# Patient Record
Sex: Female | Born: 1945
Health system: Southern US, Community
[De-identification: ages and names within clinical notes are randomized; demographics above are authoritative.]

## PROBLEM LIST (undated history)

## (undated) DIAGNOSIS — N904 Leukoplakia of vulva: Secondary | ICD-10-CM

## (undated) DIAGNOSIS — H269 Unspecified cataract: Secondary | ICD-10-CM

## (undated) DIAGNOSIS — F329 Major depressive disorder, single episode, unspecified: Secondary | ICD-10-CM

## (undated) DIAGNOSIS — D126 Benign neoplasm of colon, unspecified: Secondary | ICD-10-CM

## (undated) DIAGNOSIS — E119 Type 2 diabetes mellitus without complications: Secondary | ICD-10-CM

## (undated) DIAGNOSIS — I10 Essential (primary) hypertension: Secondary | ICD-10-CM

## (undated) DIAGNOSIS — F419 Anxiety disorder, unspecified: Secondary | ICD-10-CM

## (undated) DIAGNOSIS — N950 Postmenopausal bleeding: Secondary | ICD-10-CM

## (undated) DIAGNOSIS — E785 Hyperlipidemia, unspecified: Secondary | ICD-10-CM

## (undated) DIAGNOSIS — H409 Unspecified glaucoma: Secondary | ICD-10-CM

## (undated) DIAGNOSIS — G629 Polyneuropathy, unspecified: Secondary | ICD-10-CM

## (undated) DIAGNOSIS — S92909A Unspecified fracture of unspecified foot, initial encounter for closed fracture: Secondary | ICD-10-CM

## (undated) DIAGNOSIS — R079 Chest pain, unspecified: Secondary | ICD-10-CM

## (undated) DIAGNOSIS — F32A Depression, unspecified: Secondary | ICD-10-CM

## (undated) DIAGNOSIS — R011 Cardiac murmur, unspecified: Secondary | ICD-10-CM

## (undated) HISTORY — DX: Type 2 diabetes mellitus without complications: E11.9

## (undated) HISTORY — PX: CARPAL TUNNEL RELEASE: SHX101

## (undated) HISTORY — DX: Benign neoplasm of colon, unspecified: D12.6

## (undated) HISTORY — DX: Anxiety disorder, unspecified: F41.9

## (undated) HISTORY — DX: Unspecified cataract: H26.9

## (undated) HISTORY — DX: Cardiac murmur, unspecified: R01.1

## (undated) HISTORY — PX: TUBAL LIGATION: SHX77

## (undated) HISTORY — DX: Chest pain, unspecified: R07.9

## (undated) HISTORY — DX: Depression, unspecified: F32.A

## (undated) HISTORY — DX: Unspecified glaucoma: H40.9

## (undated) HISTORY — DX: Postmenopausal bleeding: N95.0

## (undated) HISTORY — DX: Essential (primary) hypertension: I10

## (undated) HISTORY — DX: Unspecified fracture of unspecified foot, initial encounter for closed fracture: S92.909A

## (undated) HISTORY — DX: Polyneuropathy, unspecified: G62.9

## (undated) HISTORY — DX: Leukoplakia of vulva: N90.4

## (undated) HISTORY — DX: Hyperlipidemia, unspecified: E78.5

## (undated) HISTORY — DX: Major depressive disorder, single episode, unspecified: F32.9

---

## 1967-08-06 HISTORY — PX: CHOLECYSTECTOMY: SHX55

## 1997-02-10 HISTORY — PX: HYSTEROSCOPY: SHX211

## 1997-09-30 ENCOUNTER — Other Ambulatory Visit: Admission: RE | Admit: 1997-09-30 | Discharge: 1997-09-30 | Payer: Self-pay | Admitting: Obstetrics and Gynecology

## 1997-09-30 HISTORY — PX: ENDOMETRIAL BIOPSY: SHX622

## 1998-07-24 ENCOUNTER — Other Ambulatory Visit: Admission: RE | Admit: 1998-07-24 | Discharge: 1998-07-24 | Payer: Self-pay | Admitting: Obstetrics and Gynecology

## 1998-12-27 ENCOUNTER — Emergency Department (HOSPITAL_COMMUNITY): Admission: EM | Admit: 1998-12-27 | Discharge: 1998-12-27 | Payer: Self-pay | Admitting: Emergency Medicine

## 1999-11-19 ENCOUNTER — Encounter: Admission: RE | Admit: 1999-11-19 | Discharge: 1999-12-25 | Payer: Self-pay | Admitting: Orthopedic Surgery

## 1999-12-13 ENCOUNTER — Other Ambulatory Visit: Admission: RE | Admit: 1999-12-13 | Discharge: 1999-12-13 | Payer: Self-pay | Admitting: Obstetrics and Gynecology

## 2000-12-19 ENCOUNTER — Ambulatory Visit (HOSPITAL_COMMUNITY): Admission: RE | Admit: 2000-12-19 | Discharge: 2000-12-19 | Payer: Self-pay | Admitting: Obstetrics and Gynecology

## 2000-12-19 ENCOUNTER — Encounter (INDEPENDENT_AMBULATORY_CARE_PROVIDER_SITE_OTHER): Payer: Self-pay | Admitting: *Deleted

## 2001-04-20 ENCOUNTER — Other Ambulatory Visit: Admission: RE | Admit: 2001-04-20 | Discharge: 2001-04-20 | Payer: Self-pay | Admitting: Obstetrics and Gynecology

## 2002-09-09 ENCOUNTER — Other Ambulatory Visit: Admission: RE | Admit: 2002-09-09 | Discharge: 2002-09-09 | Payer: Self-pay | Admitting: Obstetrics and Gynecology

## 2002-09-09 ENCOUNTER — Emergency Department (HOSPITAL_COMMUNITY): Admission: EM | Admit: 2002-09-09 | Discharge: 2002-09-09 | Payer: Self-pay | Admitting: Emergency Medicine

## 2003-10-13 ENCOUNTER — Other Ambulatory Visit: Admission: RE | Admit: 2003-10-13 | Discharge: 2003-10-13 | Payer: Self-pay | Admitting: Obstetrics and Gynecology

## 2004-08-07 ENCOUNTER — Ambulatory Visit: Payer: Self-pay | Admitting: Internal Medicine

## 2004-08-16 ENCOUNTER — Ambulatory Visit: Payer: Self-pay | Admitting: Internal Medicine

## 2005-08-08 ENCOUNTER — Other Ambulatory Visit: Admission: RE | Admit: 2005-08-08 | Discharge: 2005-08-08 | Payer: Self-pay | Admitting: Obstetrics and Gynecology

## 2006-02-07 ENCOUNTER — Encounter (INDEPENDENT_AMBULATORY_CARE_PROVIDER_SITE_OTHER): Payer: Self-pay | Admitting: *Deleted

## 2006-02-07 ENCOUNTER — Encounter: Admission: RE | Admit: 2006-02-07 | Discharge: 2006-02-07 | Payer: Self-pay

## 2006-08-07 ENCOUNTER — Encounter: Admission: RE | Admit: 2006-08-07 | Discharge: 2006-08-07 | Payer: Self-pay

## 2007-01-20 ENCOUNTER — Other Ambulatory Visit: Admission: RE | Admit: 2007-01-20 | Discharge: 2007-01-20 | Payer: Self-pay | Admitting: Obstetrics & Gynecology

## 2009-03-31 LAB — HM PAP SMEAR

## 2009-08-18 ENCOUNTER — Encounter (INDEPENDENT_AMBULATORY_CARE_PROVIDER_SITE_OTHER): Payer: Self-pay | Admitting: *Deleted

## 2010-03-02 ENCOUNTER — Telehealth: Payer: Self-pay | Admitting: Internal Medicine

## 2010-08-26 ENCOUNTER — Encounter: Payer: Self-pay | Admitting: Internal Medicine

## 2010-09-04 NOTE — Progress Notes (Signed)
Summary: Schedule NP3- To discuss colonoscopy  Phone Note Outgoing Call Call back at [facility] Phone [phone]   Call placed by: Harlow Mares CMA Duncan Dull),  March 02, 2010 2:45 PM Call placed to: Patient Summary of Call: spoke to pt and she declines to schedule at this point i advised her due to her family hx she was at an increased risk of getting colon cancer, and we did not need her to schedule her colonoscopy just an office visit. She still declined.  Initial call taken by: Harlow Mares CMA Duncan Dull),  March 02, 2010 2:47 PM

## 2010-09-04 NOTE — Letter (Signed)
Summary: Colonoscopy-Changed to Office Visit Letter  [facility]  [address] [place], [place] [address]   Phone: [phone]  Fax: [phone]      August 18, 2009 MRN: [id number]   [facility] [address] [place], [place]  [address]   Dear Ms. Tullier,   According to our records, it is time for you to schedule a Colonoscopy. However, after reviewing your medical record, I feel that an office visit would be most appropriate to more completely evaluate you and determine your need for a repeat procedure.  Please call [phone] (option #[phone]) at your convenience to schedule an office visit. If you have any questions, concerns, or feel that this letter is in error, we would appreciate your call.   Sincerely,  Wilhemina Bonito. Marina Goodell, M.D.  [facility] [phone]

## 2010-10-10 ENCOUNTER — Other Ambulatory Visit: Payer: Self-pay | Admitting: Dermatology

## 2010-12-21 NOTE — Op Note (Signed)
[facility]  Patient:    Kelsey Pierce, Kelsey Pierce                     MRN: [id number] Proc. Date: 12/19/00 Adm. Date:  [id number] Attending:  Jenean Lindau                           Operative Report  PREOPERATIVE DIAGNOSIS:  Postmenopausal bleeding due to endometrial polyp.  POSTOPERATIVE DIAGNOSIS:  Postmenopausal bleeding due to endometrial polyp.  PROCEDURE:  Hysteroscopic resection with curettage.  SURGEON:  Laqueta Linden, M.D.  ANESTHESIA:  MAC sedation with paracervical block.  ESTIMATED BLOOD LOSS:  Less than 20 cc.  SORBITOL NET INTAKE:  Zero.  COMPLICATIONS:  None.  INDICATIONS FOR PROCEDURE:  Kelsey Pierce is a 65 year old menopausal female on cyclic hormone replacement therapy who presented with an episode of postmenopausal bleeding. She had a history of an endometrial polyp with simple endometrial hyperplasia back in 1998 that was managed with hysteroscopic resection and progesterone cycling with follow-up biopsy being negative. She presents now with recurrent heavy bleeding. She underwent pelvic ultrasound which revealed a 4.1 mm endometrial stripe with normal appearing ovaries and two small intramural fibroids. Sonohysterogram revealed a 6 mm focus anterior to the right in the lower uterine segment consistent with a polyp versus possibly a submucosal fibroid. Due to the patients prior history of hyperplasia and the evidence of recurrent polyp, she is therefore to undergo a hysteroscopic evaluation with resection and curettage as indicated. Full consent has been given.  DESCRIPTION OF PROCEDURE:  The patient was taken to the operating room and after proper identification and consents were ascertained, she was placed on the operating table in the supine position. After IV sedation was accomplished, she was placed in the [place] stirrups and the perineum and vagina were prepped and draped in a routine sterile fashion. A transurethral  Foley was placed which was removed at the conclusion of the procedure. Bimanual examination confirmed a normal size anterior mobile uterus. The speculum was placed in the vagina and the cervix grasped with a single tooth tenaculum. A paracervical block utilizing 10 cc of 1% plain xylocaine was then placed. The internal os was gently dilated to a #33 Pratt dilator. The resectoscope with continuous sorbitol infusion and video was then inserted. The endocervical canal was free of lesions. Immediately inside the lower uterine segment on the right was noted to be a protuberant broad based polyp versus submucosal fibroid. Beyond this, the endometrial cavity appeared to be sort of diffusely thickened with diffusely thickened tissue but no focal lesions. Both tubal ostia were visualized. There were no other polyps, fibroids or other abnormalities identified. The resectoscope was placed on its routine settings and the previously mentioned lesion was resected. Small bleeding points were cauterized. The resectoscope was then removed. Sharp curettage productive of a minimal amount of additional tissue was then performed and this specimen was sent separately. All instruments were then removed. There was no active bleeding per os or from the tenaculum site. Estimated blood loss was less than 20 cc, net sorbitol intake 0, complications none. The patient was stable on transfer to the recovery room. She will be observed in discharge per anesthesia protocol. She will be seen in the office in four to six weeks time. She was given routine verbal and written discharge instructions. She is to take Tylenol for any discomfort and she is sensitive to ibuprofen. She is  to call for excessive pain, fever, bleeding or other concerns. DD:  12/19/00 TD:  12/20/00 Job: [id number]

## 2012-08-05 DIAGNOSIS — D126 Benign neoplasm of colon, unspecified: Secondary | ICD-10-CM

## 2012-08-05 HISTORY — DX: Benign neoplasm of colon, unspecified: D12.6

## 2013-01-03 DIAGNOSIS — S92909A Unspecified fracture of unspecified foot, initial encounter for closed fracture: Secondary | ICD-10-CM

## 2013-01-03 HISTORY — DX: Unspecified fracture of unspecified foot, initial encounter for closed fracture: S92.909A

## 2013-01-06 ENCOUNTER — Encounter: Payer: Self-pay | Admitting: Internal Medicine

## 2013-01-06 ENCOUNTER — Ambulatory Visit (INDEPENDENT_AMBULATORY_CARE_PROVIDER_SITE_OTHER): Payer: Medicare Other | Admitting: Internal Medicine

## 2013-01-06 VITALS — BP 130/50 | HR 69 | Ht 63.0 in | Wt 151.2 lb

## 2013-01-06 DIAGNOSIS — I059 Rheumatic mitral valve disease, unspecified: Secondary | ICD-10-CM

## 2013-01-06 DIAGNOSIS — F411 Generalized anxiety disorder: Secondary | ICD-10-CM

## 2013-01-06 DIAGNOSIS — R002 Palpitations: Secondary | ICD-10-CM

## 2013-01-06 DIAGNOSIS — I517 Cardiomegaly: Secondary | ICD-10-CM

## 2013-01-06 DIAGNOSIS — Z794 Long term (current) use of insulin: Secondary | ICD-10-CM

## 2013-01-06 DIAGNOSIS — E119 Type 2 diabetes mellitus without complications: Secondary | ICD-10-CM

## 2013-01-06 DIAGNOSIS — E785 Hyperlipidemia, unspecified: Secondary | ICD-10-CM | POA: Insufficient documentation

## 2013-01-06 DIAGNOSIS — I340 Nonrheumatic mitral (valve) insufficiency: Secondary | ICD-10-CM | POA: Insufficient documentation

## 2013-01-06 DIAGNOSIS — F419 Anxiety disorder, unspecified: Secondary | ICD-10-CM

## 2013-01-06 DIAGNOSIS — IMO0001 Reserved for inherently not codable concepts without codable children: Secondary | ICD-10-CM | POA: Insufficient documentation

## 2013-01-06 DIAGNOSIS — I079 Rheumatic tricuspid valve disease, unspecified: Secondary | ICD-10-CM

## 2013-01-06 DIAGNOSIS — I071 Rheumatic tricuspid insufficiency: Secondary | ICD-10-CM

## 2013-01-06 NOTE — Patient Instructions (Signed)
Follow-up annually

## 2013-01-06 NOTE — Progress Notes (Signed)
OFFICE NOTE  Chief Complaint:  Routine follow-up  Primary Care Physician: Kelsey Meo, MD  HPI:  Kelsey Pierce  is a 67 year old female with a history of epigastric pain, a systolic murmur, which on echo there was only trace amount of MR and mild TR. She also had mild concentric LVH and left atrial enlargement with a normal LV systolic function. There was not  impaired diastolic dysfunction. I have talked about some of the signs of hypertensive heart disease and felt that we should treat her more aggressively for her blood pressure and recommended starting lisinopril 20 mg daily. She has since followed up with Kelsey Pierce, our clinical pharmacist, who had added 5 mg of amlodipine for blood pressures that remained elevated. I did review a two month blood pressure chart from her, which shows very good control of her blood pressure between 112 to 133 systolic. Heart rate has ranged basically in the 60s and low 70s. Symptomatically, she is doing very well. Denies any chest pain, worsening shortness of breath, or any associated problems.   PMHx:  Past Medical History  Diagnosis Date  . Chest pain     2D ECHO, 05/29/2011 -EF >55%, normal; MYOVIEW, 05/29/2011 - normal    History reviewed. No pertinent past surgical history.  FAMHx:  Family History  Problem Relation Age of Onset  . Cancer Mother     Lung cancer  . Stroke Father 70  . Heart disease Father   . Cancer Brother     Liver cancer  . Cancer Maternal Grandmother     Colon cancer  . Heart attack Maternal Grandfather   . Heart attack Paternal Grandmother   . Alzheimer's disease Paternal Grandfather   . Heart disease Brother 71    SOCHx:   reports that she has never smoked. She does not have any smokeless tobacco history on file. She reports that she does not drink alcohol or use illicit drugs.  ALLERGIES:  Allergies  Allergen Reactions  . Erythromycin     ROS: A comprehensive review of systems was negative except  for: Cardiovascular: positive for palpitations  HOME MEDS: Current Outpatient Prescriptions  Medication Sig Dispense Refill  . ALPRAZolam (XANAX) 0.25 MG tablet Take 0.25 mg by mouth every 8 (eight) hours.      Marland Kitchen amLODipine (NORVASC) 5 MG tablet Take 5 mg by mouth daily.      . DULoxetine (CYMBALTA) 30 MG capsule Take 30 mg by mouth 2 (two) times daily.      . Insulin Human (INSULIN PUMP) 100 unit/ml SOLN Inject into the skin.      Marland Kitchen latanoprost (XALATAN) 0.005 % ophthalmic solution Place 1 drop into both eyes at bedtime.      Marland Kitchen lisinopril (PRINIVIL,ZESTRIL) 40 MG tablet Take 40 mg by mouth daily.      . Multiple Vitamin (MULTIVITAMIN) tablet Take 1 tablet by mouth daily.      . rosuvastatin (CRESTOR) 10 MG tablet Take 10 mg by mouth at bedtime.      . traMADol (ULTRAM) 50 MG tablet Take 50 mg by mouth every 6 (six) hours as needed for pain.      . traZODone (DESYREL) 50 MG tablet Take 50 mg by mouth at bedtime.       No current facility-administered medications for this visit.    LABS/IMAGING: No results found for this or any previous visit (from the past 48 hour(s)). No results found.  VITALS: BP 130/50  Pulse 69  Ht 5\' 3"  (  1.6 m)  Wt 151 lb 3.2 oz (68.584 kg)  BMI 26.79 kg/m2  EXAM: General appearance: alert and no distress Neck: no adenopathy, no carotid bruit, no JVD, supple, symmetrical, trachea midline and thyroid not enlarged, symmetric, no tenderness/mass/nodules Lungs: clear to auscultation bilaterally Heart: regular rate and rhythm, S1, S2 normal 2-3/6 systolic murmur at the RUSB, 2/6 murmur at the apex Abdomen: soft, non-tender; bowel sounds normal; no masses,  no organomegaly Extremities: extremities normal, atraumatic, no cyanosis or edema Pulses: 2+ and symmetric Skin: Skin color, texture, turgor normal. No rashes or lesions Neurologic: Grossly normal  EKG: Normal sinus rhythm at  69  ASSESSMENT: 1. Hypertension-controlled 2. Anxiety 3. Palpitations 4. Insulin-dependent diabetes 5. Depression  PLAN: 1.   Overall [name] appears to be doing very well.  Unfortunately the [employer] closed, and she is not exercising as much as she had previously. There is a small amount of weight gain. Blood pressure remains well controlled. She is not on a beta blocker for palpitations due to problems with blood sugar in sensitivity. She reports she continues to have palpitations however they're very tolerable. We monitored in the past they were PVCs. Overall and please with her blood pressure control and for her, the palpitations are tolerable. Her valvular murmurs sounds stable and she underwent an echocardiogram in 2012 which only showed mild TR and MR with mild concentric LVH and EF greater than 55%. A stress test at that time was also negative for ischemia with 11 metabolic equivalents of exercise and EF of 72%. We'll plan to see her back annually or sooner as necessary.  Kelsey Nose, MD, [facility] Attending Cardiologist [facility]  Kelsey Pierce C 01/06/2013, 11:59 AM

## 2013-01-13 ENCOUNTER — Encounter: Payer: Self-pay | Admitting: Internal Medicine

## 2013-01-14 ENCOUNTER — Other Ambulatory Visit: Payer: Self-pay | Admitting: Internal Medicine

## 2013-01-31 ENCOUNTER — Emergency Department (HOSPITAL_BASED_OUTPATIENT_CLINIC_OR_DEPARTMENT_OTHER)
Admission: EM | Admit: 2013-01-31 | Discharge: 2013-01-31 | Disposition: A | Discharge status: Home / Self Care | Payer: Medicare Other | Attending: Emergency Medicine | Admitting: Emergency Medicine

## 2013-01-31 ENCOUNTER — Emergency Department (HOSPITAL_BASED_OUTPATIENT_CLINIC_OR_DEPARTMENT_OTHER): Payer: Medicare Other

## 2013-01-31 ENCOUNTER — Encounter (HOSPITAL_BASED_OUTPATIENT_CLINIC_OR_DEPARTMENT_OTHER): Payer: Self-pay | Admitting: *Deleted

## 2013-01-31 DIAGNOSIS — X503XXA Overexertion from repetitive movements, initial encounter: Secondary | ICD-10-CM | POA: Insufficient documentation

## 2013-01-31 DIAGNOSIS — Z79899 Other long term (current) drug therapy: Secondary | ICD-10-CM | POA: Insufficient documentation

## 2013-01-31 DIAGNOSIS — S92309A Fracture of unspecified metatarsal bone(s), unspecified foot, initial encounter for closed fracture: Secondary | ICD-10-CM | POA: Insufficient documentation

## 2013-01-31 DIAGNOSIS — Z794 Long term (current) use of insulin: Secondary | ICD-10-CM | POA: Insufficient documentation

## 2013-01-31 DIAGNOSIS — Y939 Activity, unspecified: Secondary | ICD-10-CM | POA: Insufficient documentation

## 2013-01-31 DIAGNOSIS — Y929 Unspecified place or not applicable: Secondary | ICD-10-CM | POA: Insufficient documentation

## 2013-01-31 DIAGNOSIS — S92302A Fracture of unspecified metatarsal bone(s), left foot, initial encounter for closed fracture: Secondary | ICD-10-CM

## 2013-01-31 MED ORDER — OXYCODONE-ACETAMINOPHEN 5-325 MG PO TABS: 2.0000 | ORAL_TABLET | ORAL | Status: DC | PRN

## 2013-01-31 NOTE — ED Provider Notes (Signed)
History  This chart was scribed for Rolan Bucco, MD, MD by Ashley Jacobs, ED Scribe. The patient was seen in room MH12/MH12 and the patient's care was started at 4:02 PM    CSN: [id number] Arrival date & time 01/31/13  1415    Chief Complaint  Patient presents with  . Foot Injury    The history is provided by the patient and medical records. No language interpreter was used.   HPI Comments: Kelsey Pierce is a 67 y.o. female who presents to the Emergency Department complaining of moderate, left foot pain that has been constant after injuring left foot yesterday. Pt reports that she fell causing her left foot to twist inwardly. She states that her foot was numb before the fall due the position she slept in. Pt reports numbness in her foot currently. Pt also reports being diabetic. She denies fever, chills, nausea, vomiting, diarrhea, weakness, cough, SOB and any other pain.  She denies any other injuries.  Past Medical History  Diagnosis Date  . Chest pain     2D ECHO, 05/29/2011 -EF >55%, normal; MYOVIEW, 05/29/2011 - normal   History reviewed. No pertinent past surgical history. Family History  Problem Relation Age of Onset  . Cancer Mother     Lung cancer  . Stroke Father 24  . Heart disease Father   . Cancer Brother     Liver cancer  . Cancer Maternal Grandmother     Colon cancer  . Heart attack Maternal Grandfather   . Heart attack Paternal Grandmother   . Alzheimer's disease Paternal Grandfather   . Heart disease Brother 43   History  Substance Use Topics  . Smoking status: Never Smoker   . Smokeless tobacco: Not on file  . Alcohol Use: No   OB History   Grav Para Term Preterm Abortions TAB SAB Ect Mult Living                 Review of Systems  Constitutional: Negative for fever and chills.  HENT: Negative for neck pain.   Respiratory: Negative for shortness of breath.   Gastrointestinal: Negative for nausea and vomiting.  Musculoskeletal: Positive  for joint swelling and arthralgias. Negative for back pain.  Skin: Negative for wound.  Neurological: Negative for weakness, numbness and headaches.    Allergies  Erythromycin  Home Medications   Current Outpatient Rx  Name  Route  Sig  Dispense  Refill  . ALPRAZolam (XANAX) 0.25 MG tablet   Oral   Take 0.25 mg by mouth every 8 (eight) hours.         Marland Kitchen amLODipine (NORVASC) 5 MG tablet      TAKE 1 TABLET BY MOUTH EVERY DAY   30 tablet   5   . DULoxetine (CYMBALTA) 30 MG capsule   Oral   Take 30 mg by mouth 2 (two) times daily.         . Insulin Human (INSULIN PUMP) 100 unit/ml SOLN   Subcutaneous   Inject into the skin.         Marland Kitchen latanoprost (XALATAN) 0.005 % ophthalmic solution   Both Eyes   Place 1 drop into both eyes at bedtime.         Marland Kitchen lisinopril (PRINIVIL,ZESTRIL) 40 MG tablet   Oral   Take 40 mg by mouth daily.         . Multiple Vitamin (MULTIVITAMIN) tablet   Oral   Take 1 tablet by mouth daily.         Marland Kitchen  oxyCODONE-acetaminophen (PERCOCET) 5-325 MG per tablet   Oral   Take 2 tablets by mouth every 4 (four) hours as needed for pain.   20 tablet   0   . rosuvastatin (CRESTOR) 10 MG tablet   Oral   Take 10 mg by mouth at bedtime.         . traMADol (ULTRAM) 50 MG tablet   Oral   Take 50 mg by mouth every 6 (six) hours as needed for pain.         . traZODone (DESYREL) 50 MG tablet   Oral   Take 50 mg by mouth at bedtime.          BP 147/56  Pulse 69  Temp(Src) 98.5 F (36.9 C) (Oral)  Resp 18  SpO2 99% Physical Exam  Nursing note and vitals reviewed. Constitutional: She is oriented to person, place, and time. She appears well-developed and well-nourished.  HENT:  Head: Normocephalic and atraumatic.  Eyes: Pupils are equal, round, and reactive to light.  Neck: Normal range of motion. Neck supple.  Cardiovascular: Normal rate.   Pulmonary/Chest: Effort normal. No respiratory distress.  Musculoskeletal: Normal range of  motion. She exhibits edema and tenderness.  Moderate swelling and  ecchymosis to left foot Lateral aspect of left foot has tenderness to palpation  No pain to left ankle or left knee Pulses are intact in left foot She is able to wiggle the toes on the left foot She has mild numbness to light touch in the toes of the left foot   Neurological: She is alert and oriented to person, place, and time.  Skin: Skin is warm and dry. No rash noted.  Psychiatric: She has a normal mood and affect.    ED Course  Procedures (including critical care time) DIAGNOSTIC STUDIES: Oxygen Saturation is 99% on room air, normal by my interpretation.    COORDINATION OF CARE: 3:53 PM Discussed ED treatment with pt and pt agrees.   Labs Reviewed - No data to display Dg Foot Complete Left  01/31/2013   *RADIOLOGY REPORT*  Clinical Data: The left foot pain, swelling, and bruising after fall.  LEFT FOOT - COMPLETE 3+ VIEW  Comparison: None.  Findings: There is an acute nondisplaced Jones fracture of the base of the fifth metatarsal which extends to the intermetatarsal articulation but not to the articular surface with the cuboid.  No fracture or malalignment along the Lisfranc joint.  Exaggeration of the longitudinal arch of the foot on the lateral projection may be due to lack of weightbearing.  IMPRESSION:  1.  Jones fracture of the proximal fifth metatarsal extends to the intermetatarsal articular surface.   Original Report Authenticated By: Gaylyn Rong, M.D.   1. Metatarsal fracture, left, closed, initial encounter     MDM  PT was placed in a posterior splint.  Has a walker at home.  Advised in RICE.  Given referral to f/u with ortho next week I personally performed the services described in this documentation, which was scribed in my presence.  The recorded information has been reviewed and considered.   Rolan Bucco, MD 01/31/13 1651

## 2013-01-31 NOTE — ED Notes (Signed)
Foot fell asleep and she stood up and fell onto her left foot. Bruising and swelling to the outside of foot.

## 2013-03-11 ENCOUNTER — Encounter: Payer: Self-pay | Admitting: Internal Medicine

## 2013-03-11 ENCOUNTER — Telehealth: Payer: Self-pay | Admitting: *Deleted

## 2013-03-11 ENCOUNTER — Ambulatory Visit (AMBULATORY_SURGERY_CENTER): Payer: Medicare Other | Admitting: *Deleted

## 2013-03-11 VITALS — Ht 62.0 in | Wt 153.6 lb

## 2013-03-11 DIAGNOSIS — Z1211 Encounter for screening for malignant neoplasm of colon: Secondary | ICD-10-CM

## 2013-03-11 MED ORDER — MOVIPREP 100 G PO SOLR: ORAL | Status: DC

## 2013-03-11 NOTE — Telephone Encounter (Signed)
Letter sent to Dr. Jacky Kindle for insulin instructions.

## 2013-03-11 NOTE — Telephone Encounter (Signed)
Linda:  Pt is scheduled for colonoscopy with Dr. Marina Goodell on 8/21 at 9:30.  Pt has insulin pump managed by Dr. Aniceto Boss.  Please send letter to him for insulin pump instructions the day before the procedure and the day of.  The best telephone number to reach pt is: [phone].  Thanks.

## 2013-03-11 NOTE — Progress Notes (Signed)
No allergies to eggs or soy. No problems with anesthesia.  

## 2013-03-18 NOTE — Telephone Encounter (Signed)
Instruction received from Dr. Lanell Matar office regarding insulin pump. Pt instructed to start at dinner the night before colon to temp her basal rate to .09 for 24 hours and to bolus as needed. Pt aware, letter sent to medical records to be scanned.

## 2013-03-25 ENCOUNTER — Ambulatory Visit (AMBULATORY_SURGERY_CENTER): Payer: Medicare Other | Admitting: Internal Medicine

## 2013-03-25 ENCOUNTER — Encounter: Payer: Self-pay | Admitting: Internal Medicine

## 2013-03-25 VITALS — BP 145/61 | HR 85 | Temp 96.2°F | Resp 19 | Ht 62.0 in | Wt 153.0 lb

## 2013-03-25 DIAGNOSIS — D126 Benign neoplasm of colon, unspecified: Secondary | ICD-10-CM

## 2013-03-25 DIAGNOSIS — Z1211 Encounter for screening for malignant neoplasm of colon: Secondary | ICD-10-CM

## 2013-03-25 MED ORDER — SODIUM CHLORIDE 0.9 % IV SOLN: 500.0000 mL | INTRAVENOUS | Status: DC

## 2013-03-25 NOTE — Progress Notes (Signed)
Lidocaine-40mg IV prior to Propofol InductionPropofol given over incremental dosages 

## 2013-03-25 NOTE — Patient Instructions (Addendum)
YOU HAD AN ENDOSCOPIC PROCEDURE TODAY AT THE [facility]: Refer to the procedure report that was given to you for any specific questions about what was found during the examination.  If the procedure report does not answer your questions, please call your gastroenterologist to clarify.  If you requested that your care partner not be given the details of your procedure findings, then the procedure report has been included in a sealed envelope for you to review at your convenience later.  YOU SHOULD EXPECT: Some feelings of bloating in the abdomen. Passage of more gas than usual.  Walking can help get rid of the air that was put into your GI tract during the procedure and reduce the bloating. If you had a lower endoscopy (such as a colonoscopy or flexible sigmoidoscopy) you may notice spotting of blood in your stool or on the toilet paper. If you underwent a bowel prep for your procedure, then you may not have a normal bowel movement for a few days.  DIET: Your first meal following the procedure should be a light meal and then it is ok to progress to your normal diet.  A half-sandwich or bowl of soup is an example of a good first meal.  Heavy or fried foods are harder to digest and may make you feel nauseous or bloated.  Likewise meals heavy in dairy and vegetables can cause extra gas to form and this can also increase the bloating.  Drink plenty of fluids but you should avoid alcoholic beverages for 24 hours.  ACTIVITY: Your care partner should take you home directly after the procedure.  You should plan to take it easy, moving slowly for the rest of the day.  You can resume normal activity the day after the procedure however you should NOT DRIVE or use heavy machinery for 24 hours (because of the sedation medicines used during the test).    SYMPTOMS TO REPORT IMMEDIATELY: A gastroenterologist can be reached at any hour.  During normal business hours, 8:30 AM to 5:00 PM Monday through Friday,  call [phone].  After hours and on weekends, please call the GI answering service at [phone] who will take a message and have the physician on call contact you.   Following lower endoscopy (colonoscopy or flexible sigmoidoscopy):  Excessive amounts of blood in the stool  Significant tenderness or worsening of abdominal pains  Swelling of the abdomen that is new, acute  Fever of 100F or higher  FOLLOW UP: If any biopsies were taken you will be contacted by phone or by letter within the next 1-3 weeks.  Call your gastroenterologist if you have not heard about the biopsies in 3 weeks.  Our staff will call the home number listed on your records the next business day following your procedure to check on you and address any questions or concerns that you may have at that time regarding the information given to you following your procedure. This is a courtesy call and so if there is no answer at the home number and we have not heard from you through the emergency physician on call, we will assume that you have returned to your regular daily activities without incident.  SIGNATURES/CONFIDENTIALITY: You and/or your care partner have signed paperwork which will be entered into your electronic medical record.  These signatures attest to the fact that that the information above on your After Visit Summary has been reviewed and is understood.  Full responsibility of the confidentiality of this   discharge information lies with you and/or your care-partner.  Polyps-handout given  Repeat colonoscopy will be determined by pathology   

## 2013-03-25 NOTE — Op Note (Signed)
[facility] [address] [place], [address]   COLONOSCOPY PROCEDURE REPORT  PATIENT: Kelsey Pierce, Kelsey Pierce  MR#: [id number] BIRTHDATE: [birth year] , 66  yrs. old GENDER: Female ENDOSCOPIST: Roxy Cedar, MD REFERRED AO:[id number] Recall, PROCEDURE DATE:  03/25/2013 PROCEDURE:   Colonoscopy with snare polypectomy x 1 First Screening Colonoscopy - Avg.  risk and is 50 yrs.  old or older - No.  Prior Negative Screening - Now for repeat screening. Above average risk  History of Adenoma - Now for follow-up colonoscopy & has been > or = to 3 yrs.  N/A  Polyps Removed Today? Yes. ASA CLASS:   Class III INDICATIONS:Patient's immediate family history of colon cancer. Grandparent. Negative index exam 9 yrs ago. MEDICATIONS: MAC sedation, administered by CRNA and propofol (Diprivan) 200mg  IV  DESCRIPTION OF PROCEDURE:   After the risks benefits and alternatives of the procedure were thoroughly explained, informed consent was obtained.  A digital rectal exam revealed no abnormalities of the rectum.   The [id number]  endoscope was introduced through the anus and advanced to the cecum, which was identified by both the appendix and ileocecal valve. No adverse events experienced.   The quality of the prep was excellent, using MoviPrep  The instrument was then slowly withdrawn as the colon was fully examined.      COLON FINDINGS: A diminutive polyp was found in the transverse colon.  A polypectomy was performed with a cold snare.  The resection was complete and the polyp tissue was completely retrieved.   The colon mucosa was otherwise normal.  Retroflexed views revealed no abnormalities. The time to cecum=4 minutes 05 seconds.  Withdrawal time=9 minutes 42 seconds.  The scope was withdrawn and the procedure completed. COMPLICATIONS: There were no complications.  ENDOSCOPIC IMPRESSION: 1.   Diminutive polyp was found in the transverse colon; polypectomy was  performed with a cold snare 2.   The colon mucosa was otherwise normal  RECOMMENDATIONS: 1. Repeat colonoscopy in 5 years if polyp adenomatous; otherwise 10 years   eSigned:  Roxy Cedar, MD 03/25/2013 10:18 AM   cc: Geoffry Paradise, MD and The Patient   PATIENT NAME:  Kelsey Pierce, Kelsey Pierce MR#: [id number]

## 2013-03-25 NOTE — Progress Notes (Signed)
Called to room to assist during endoscopic procedure.  Patient ID and intended procedure confirmed with present staff. Received instructions for my participation in the procedure from the performing physician.  

## 2013-03-25 NOTE — Progress Notes (Signed)
Patient did not experience any of the following events: a burn prior to discharge; a fall within the facility; wrong site/side/patient/procedure/implant event; or a hospital transfer or hospital admission upon discharge from the facility. (G8907) Patient did not have preoperative order for IV antibiotic SSI prophylaxis. (G8918)  

## 2013-03-26 ENCOUNTER — Telehealth: Payer: Self-pay | Admitting: *Deleted

## 2013-03-26 NOTE — Telephone Encounter (Signed)
  Follow up Call-  Call back number 03/25/2013  Post procedure Call Back phone  # [phone]  Permission to leave phone message Yes     Patient questions:  Do you have a fever, pain , or abdominal swelling? no Pain Score  0 *  Have you tolerated food without any problems? yes  Have you been able to return to your normal activities? yes  Do you have any questions about your discharge instructions: Diet   no Medications  no Follow up visit  no  Do you have questions or concerns about your Care? no  Actions: * If pain score is 4 or above: No action needed, pain <4.

## 2013-03-31 ENCOUNTER — Encounter: Payer: Self-pay | Admitting: Internal Medicine

## 2013-04-27 ENCOUNTER — Encounter: Payer: Self-pay | Admitting: Obstetrics and Gynecology

## 2013-04-28 ENCOUNTER — Ambulatory Visit: Payer: Self-pay | Admitting: Obstetrics and Gynecology

## 2013-04-28 ENCOUNTER — Encounter: Payer: Self-pay | Admitting: Obstetrics and Gynecology

## 2013-04-28 ENCOUNTER — Ambulatory Visit (INDEPENDENT_AMBULATORY_CARE_PROVIDER_SITE_OTHER): Payer: Medicare Other | Admitting: Obstetrics and Gynecology

## 2013-04-28 VITALS — BP 138/68 | HR 70 | Ht 63.0 in | Wt 155.5 lb

## 2013-04-28 DIAGNOSIS — Z0000 Encounter for general adult medical examination without abnormal findings: Secondary | ICD-10-CM

## 2013-04-28 DIAGNOSIS — Z01419 Encounter for gynecological examination (general) (routine) without abnormal findings: Secondary | ICD-10-CM

## 2013-04-28 LAB — POCT URINALYSIS DIPSTICK
Bilirubin, UA: NEGATIVE
Leukocytes, UA: NEGATIVE
Nitrite, UA: NEGATIVE
pH, UA: 5

## 2013-04-28 NOTE — Patient Instructions (Signed)

## 2013-04-28 NOTE — Progress Notes (Addendum)
Patient ID: Kelsey Pierce, female   DOB: 1945/12/02, 67 y.o.   MRN: [id number] GYNECOLOGY VISIT  PCP: Geoffry Paradise, MD  Referring provider:   HPI: 67 y.o.   Married  Caucasian  female   [phone] with Patient's last menstrual period was 08/06/1999.   here for   AEX. No postmenopausal bleeding. No bladder control problems.   HgbA1C = 6.7  Broke left foot at the end of June.   Wearing a boot still.  Hgb:  PCP Urine:  Trace Glucose  GYNECOLOGIC HISTORY: Patient's last menstrual period was 08/06/1999. Sexually active: no  Partner preference: female Contraception: postmenopausal   Menopausal hormone therapy: no DES exposure:  no  Blood transfusions:  no  Sexually transmitted diseases: no   GYN Procedures:  Hysteroscopy and D & C, Endometrial biopsy Mammogram:   08-12-12 [id number]              Pap:  03-31-2009 wnl  History of abnormal pap smear:  no   OB History   Grav Para Term Preterm Abortions TAB SAB Ect Mult Living   3 2 2  1     2        LIFESTYLE: Exercise:    Treadmill, Elyptical          Tobacco:     no Alcohol:        no Drug use:     no  OTHER HEALTH MAINTENANCE: Tetanus/TDap:  2007 Gardisil:  NA Influenza:  04/2012 Zostavax:  yes  Bone density:  02/2013 with Dr. Jonathon Jordan  Colonoscopy:  01/2013 had adenomatous polyp with Dr. Yancey Flemings.  Next colonoscopy due 01/2018.  Cholesterol check: 01/2013 wnl:PCP  Family History  Problem Relation Age of Onset  . Cancer Mother     Lung cancer  . Stroke Father 39  . Heart disease Father   . Hypertension Father   . Cancer Brother     Liver cancer  . Cancer Maternal Grandmother     Colon cancer  . Colon cancer Maternal Grandmother 40  . Heart attack Maternal Grandfather   . Heart attack Paternal Grandmother   . Alzheimer's disease Paternal Grandfather   . Heart disease Brother 38    Patient Active Problem List   Diagnosis Date Noted  . Palpitations 01/06/2013  . Anxiety 01/06/2013  . IDDM (insulin  dependent diabetes mellitus) 01/06/2013  . LVH (left ventricular hypertrophy) 01/06/2013  . Mitral regurgitation 01/06/2013  . Tricuspid regurgitation 01/06/2013  . Hyperlipidemia 01/06/2013   Past Medical History  Diagnosis Date  . Chest pain     2D ECHO, 05/29/2011 -EF >55%, normal; MYOVIEW, 05/29/2011 - normal  . Diabetes   . Hyperlipidemia   . Hypertension   . Bilateral cataracts   . Glaucoma   . Fracture, foot june 2014    left  . Depression   . Neuropathy     legs  . PMB (postmenopausal bleeding)   . Adenomatous colon polyp 2014    repeat colon in 5 years    Past Surgical History  Procedure Laterality Date  . Cholecystectomy  1969  . Cesarean section  1974  . Carpal tunnel release Bilateral   . Tubal ligation Bilateral   . Hysteroscopy  02-10-97    D&C, polyp--focal hyperplasia w/o atypia  . Endometrial biopsy  09-30-97    Dr Larita Fife Smith--benign    ALLERGIES: Erythromycin  Current Outpatient Prescriptions  Medication Sig Dispense Refill  . ACCU-CHEK AVIVA PLUS test strip       .  ALPRAZolam (XANAX) 0.25 MG tablet Take 0.25 mg by mouth every 8 (eight) hours.      Marland Kitchen amLODipine (NORVASC) 5 MG tablet TAKE 1 TABLET BY MOUTH EVERY DAY  30 tablet  5  . DULoxetine (CYMBALTA) 30 MG capsule Take 30 mg by mouth 2 (two) times daily.      Marland Kitchen HUMALOG 100 UNIT/ML injection       . Insulin Human (INSULIN PUMP) 100 unit/ml SOLN Inject into the skin.      Marland Kitchen latanoprost (XALATAN) 0.005 % ophthalmic solution Place 1 drop into both eyes at bedtime.      Marland Kitchen lisinopril (PRINIVIL,ZESTRIL) 40 MG tablet Take 40 mg by mouth daily.      . Multiple Vitamin (MULTIVITAMIN) tablet Take 1 tablet by mouth daily.      . rosuvastatin (CRESTOR) 10 MG tablet Take 10 mg by mouth at bedtime.      . traMADol (ULTRAM) 50 MG tablet Take 50 mg by mouth every 6 (six) hours as needed for pain.      . traZODone (DESYREL) 50 MG tablet Take 50 mg by mouth at bedtime.       No current facility-administered  medications for this visit.     ROS:  Pertinent items are noted in HPI.  SOCIAL HISTORY:  Widow.  Retired. 2 grandchildren.  PHYSICAL EXAMINATION:    BP 138/68  Pulse 70  Ht 5\' 3"  (1.6 m)  Wt 155 lb 8 oz (70.534 kg)  BMI 27.55 kg/m2  LMP 08/06/1999   Wt Readings from Last 3 Encounters:  04/28/13 155 lb 8 oz (70.534 kg)  03/25/13 153 lb (69.4 kg)  03/11/13 153 lb 9.6 oz (69.673 kg)     Ht Readings from Last 3 Encounters:  04/28/13 5\' 3"  (1.6 m)  03/25/13 5\' 2"  (1.575 m)  03/11/13 5\' 2"  (1.575 m)    General appearance: alert, cooperative and appears stated age Head: Normocephalic, without obvious abnormality, atraumatic Neck: no adenopathy, supple, symmetrical, trachea midline and thyroid not enlarged, symmetric, no tenderness/mass/nodules Lungs: clear to auscultation bilaterally Breasts: Inspection negative, No nipple retraction or dimpling, No nipple discharge or bleeding, No axillary or supraclavicular adenopathy, Normal to palpation without dominant masses Heart: regular rate and rhythm Abdomen: vertical midline incision, RUQ vertical incision, soft, non-tender; no masses,  no organomegaly Extremities: extremities normal, atraumatic, no cyanosis or edema Skin: Skin color, texture, turgor normal. No rashes or lesions Lymph nodes: Cervical, supraclavicular, and axillary nodes normal. No abnormal inguinal nodes palpated Neurologic: Grossly normal  Pelvic: External genitalia:  Clitoral protrusion (retraction of the clitoral hood).  White thinning of the perineum and the labia bilaterally.  (States she uses OTC hydrocortisone cream on perineum when it splits.  This helps.)              Urethra:  normal appearing urethra with no masses, tenderness or lesions              Bartholins and Skenes: normal                 Vagina: normal appearing vagina with normal color and discharge, no lesions              Cervix: normal appearance              Pap and high risk HPV testing done:  no.            Bimanual Exam:  Uterus:  uterus is normal size, shape, consistency and nontender  Adnexa: normal adnexa in size, nontender and no masses                                      Rectovaginal: Confirms                                      Anus:  normal sphincter tone, no lesions  ASSESSMENT  Normal gynecologic exam. Probable lichen sclerosis of the vulva.   PLAN  Mammogram in January [name].  If vulva becomes more symptomatic, return for vulvar biopsy and then probable temovate ointment Rx. Counseled on adequate intake of calcium and vitamin D Return annually or prn   An After Visit Summary was printed and given to the patient.

## 2013-06-10 ENCOUNTER — Other Ambulatory Visit: Payer: Self-pay

## 2013-11-22 ENCOUNTER — Other Ambulatory Visit: Payer: Self-pay | Admitting: Internal Medicine

## 2013-11-22 NOTE — Telephone Encounter (Signed)
Rx was sent to pharmacy electronically. 

## 2014-02-14 ENCOUNTER — Other Ambulatory Visit: Payer: Self-pay | Admitting: Internal Medicine

## 2014-02-14 NOTE — Telephone Encounter (Signed)
Rx was sent to pharmacy electronically. 

## 2014-03-23 ENCOUNTER — Encounter: Payer: Self-pay | Admitting: Internal Medicine

## 2014-03-23 ENCOUNTER — Ambulatory Visit (INDEPENDENT_AMBULATORY_CARE_PROVIDER_SITE_OTHER): Payer: Medicare Other | Admitting: Internal Medicine

## 2014-03-23 VITALS — BP 146/60 | HR 83 | Ht 62.0 in | Wt 160.5 lb

## 2014-03-23 DIAGNOSIS — I079 Rheumatic tricuspid valve disease, unspecified: Secondary | ICD-10-CM

## 2014-03-23 DIAGNOSIS — R002 Palpitations: Secondary | ICD-10-CM

## 2014-03-23 DIAGNOSIS — I340 Nonrheumatic mitral (valve) insufficiency: Secondary | ICD-10-CM

## 2014-03-23 DIAGNOSIS — E785 Hyperlipidemia, unspecified: Secondary | ICD-10-CM

## 2014-03-23 DIAGNOSIS — I517 Cardiomegaly: Secondary | ICD-10-CM

## 2014-03-23 DIAGNOSIS — IMO0001 Reserved for inherently not codable concepts without codable children: Secondary | ICD-10-CM

## 2014-03-23 DIAGNOSIS — I071 Rheumatic tricuspid insufficiency: Secondary | ICD-10-CM

## 2014-03-23 DIAGNOSIS — F419 Anxiety disorder, unspecified: Secondary | ICD-10-CM

## 2014-03-23 DIAGNOSIS — F411 Generalized anxiety disorder: Secondary | ICD-10-CM

## 2014-03-23 DIAGNOSIS — I059 Rheumatic mitral valve disease, unspecified: Secondary | ICD-10-CM

## 2014-03-23 DIAGNOSIS — Z794 Long term (current) use of insulin: Secondary | ICD-10-CM

## 2014-03-23 DIAGNOSIS — E119 Type 2 diabetes mellitus without complications: Secondary | ICD-10-CM

## 2014-03-23 NOTE — Patient Instructions (Signed)
Your physician wants you to follow-up in: 1 year. You will receive a reminder letter in the mail two months in advance. If you don't receive a letter, please call our office to schedule the follow-up appointment.  

## 2014-03-25 ENCOUNTER — Encounter: Payer: Self-pay | Admitting: Internal Medicine

## 2014-03-25 NOTE — Progress Notes (Signed)
OFFICE NOTE  Chief Complaint:  Routine follow-up  Primary Care Physician: Geoffery Lyons, MD  HPI:  Kelsey Pierce  is a 68 year old female with a history of epigastric pain, a systolic murmur, which on echo there was only trace amount of MR and mild TR. She also had mild concentric LVH and left atrial enlargement with a normal LV systolic function. There was not  impaired diastolic dysfunction. I have talked about some of the signs of hypertensive heart disease and felt that we should treat her more aggressively for her blood pressure and recommended starting lisinopril 20 mg daily. She has since followed up with Altha Harm, our clinical pharmacist, who had added 5 mg of amlodipine for blood pressures that remained elevated. I did review a two month blood pressure chart from her, which shows very good control of her blood pressure between 937 to 169 systolic. Heart rate has ranged basically in the 60s and low 70s.   Kelsey Pierce returns today for followup. Symptomatically, she is doing very well. Denies any chest pain, worsening shortness of breath, or any associated problems.   PMHx:  Past Medical History  Diagnosis Date  . Chest pain     2D ECHO, 05/29/2011 -EF >55%, normal; MYOVIEW, 05/29/2011 - normal  . Diabetes   . Hyperlipidemia   . Hypertension   . Bilateral cataracts   . Glaucoma   . Fracture, foot june 2014    left  . Depression   . Neuropathy     legs  . PMB (postmenopausal bleeding)   . Adenomatous colon polyp 2014    repeat colon in 5 years    Past Surgical History  Procedure Laterality Date  . Cholecystectomy  1969  . Cesarean section  1974  . Carpal tunnel release Bilateral   . Tubal ligation Bilateral   . Hysteroscopy  02-10-97    D&C, polyp--focal hyperplasia w/o atypia  . Endometrial biopsy  09-30-97    Dr Ricard Dillon    FAMHx:  Family History  Problem Relation Age of Onset  . Cancer Mother     Lung cancer  . Stroke Father 75   . Heart disease Father   . Hypertension Father   . Cancer Brother     Liver cancer  . Cancer Maternal Grandmother     Colon cancer  . Colon cancer Maternal Grandmother 49  . Heart attack Maternal Grandfather   . Heart attack Paternal Grandmother   . Alzheimer's disease Paternal Grandfather   . Heart disease Brother 48    SOCHx:   reports that she has never smoked. She has never used smokeless tobacco. She reports that she does not drink alcohol or use illicit drugs.  ALLERGIES:  Allergies  Allergen Reactions  . Erythromycin     Abdominal pain    ROS: A comprehensive review of systems was negative.  HOME MEDS: Current Outpatient Prescriptions  Medication Sig Dispense Refill  . ACCU-CHEK AVIVA PLUS test strip       . ALPRAZolam (XANAX) 0.25 MG tablet Take 0.25 mg by mouth every 8 (eight) hours.      Marland Kitchen amLODipine (NORVASC) 5 MG tablet TAKE 1 TABLET BY MOUTH DAILY  30 tablet  1  . DULoxetine (CYMBALTA) 30 MG capsule Take 30 mg by mouth daily.       Marland Kitchen HUMALOG 100 UNIT/ML injection       . Insulin Human (INSULIN PUMP) 100 unit/ml SOLN Inject into the skin.      Marland Kitchen  latanoprost (XALATAN) 0.005 % ophthalmic solution Place 1 drop into both eyes at bedtime.      Marland Kitchen lisinopril (PRINIVIL,ZESTRIL) 40 MG tablet Take 40 mg by mouth daily.      . Multiple Vitamin (MULTIVITAMIN) tablet Take 1 tablet by mouth daily.      . rosuvastatin (CRESTOR) 10 MG tablet Take 10 mg by mouth at bedtime.      . traZODone (DESYREL) 50 MG tablet Take 50 mg by mouth at bedtime.       No current facility-administered medications for this visit.    LABS/IMAGING: No results found for this or any previous visit (from the past 48 hour(s)). No results found.  VITALS: BP 146/60  Pulse 83  Ht 5\' 2"  (1.575 m)  Wt 160 lb 8 oz (72.802 kg)  BMI 29.35 kg/m2  LMP 08/06/1999  EXAM: General appearance: alert and no distress Neck: no adenopathy, no carotid bruit, no JVD, supple, symmetrical, trachea midline and  thyroid not enlarged, symmetric, no tenderness/mass/nodules Lungs: clear to auscultation bilaterally Heart: regular rate and rhythm, S1, S2 normal 4-6/8 systolic murmur at the RUSB, 2/6 murmur at the apex Abdomen: soft, non-tender; bowel sounds normal; no masses,  no organomegaly Extremities: extremities normal, atraumatic, no cyanosis or edema Pulses: 2+ and symmetric Skin: Skin color, texture, turgor normal. No rashes or lesions Neurologic: Grossly normal  EKG: Normal sinus rhythm at 83  ASSESSMENT: 1. Hypertension-controlled 2. Anxiety 3. Palpitations - resolved 4. Insulin-dependent diabetes 5. Depression  PLAN: 1.   Overall Kelsey Pierce appears to be doing very well.  Her murmurs appear stable. Blood pressure is well controlled. Her palpitations have resolved. Her cholesterol has been at goal. No changes to her medications at this time. Plan to see her back annually or sooner.  Pixie Casino, MD, [facility] Attending Cardiologist [facility] 03/25/2014, 6:27 PM

## 2014-04-12 ENCOUNTER — Other Ambulatory Visit: Payer: Self-pay | Admitting: Internal Medicine

## 2014-04-12 NOTE — Telephone Encounter (Signed)
Rx was sent to pharmacy electronically. 

## 2014-04-29 ENCOUNTER — Ambulatory Visit (INDEPENDENT_AMBULATORY_CARE_PROVIDER_SITE_OTHER): Payer: Medicare Other | Admitting: Obstetrics and Gynecology

## 2014-04-29 ENCOUNTER — Encounter: Payer: Self-pay | Admitting: Obstetrics and Gynecology

## 2014-04-29 ENCOUNTER — Ambulatory Visit: Payer: Medicare Other | Admitting: Obstetrics and Gynecology

## 2014-04-29 VITALS — BP 130/80 | HR 66 | Resp 16 | Ht 63.0 in | Wt 160.6 lb

## 2014-04-29 DIAGNOSIS — Z01419 Encounter for gynecological examination (general) (routine) without abnormal findings: Secondary | ICD-10-CM

## 2014-04-29 DIAGNOSIS — N9089 Other specified noninflammatory disorders of vulva and perineum: Secondary | ICD-10-CM

## 2014-04-29 NOTE — Progress Notes (Signed)
Patient ID: Kelsey Pierce, female   DOB: 1946-03-28, 68 y.o.   MRN: [id number] GYNECOLOGY VISIT  PCP:   Dr. Reynaldo Minium  Referring provider:   HPI: 68 y.o.   Married  Caucasian  female   [phone] with Patient's last menstrual period was 08/06/1999.   here for  AEX.  Uses cortisone daily on the vulva.  Feels like the skin splits.   Hgb:    PCP  Urine:  PCP  GYNECOLOGIC HISTORY: Patient's last menstrual period was 08/06/1999. Sexually active:  no Partner preference: female Contraception:  postmenopausal Menopausal hormone therapy: no DES exposure:  no  Blood transfusions: no  Sexually transmitted diseases:  no  GYN procedures and prior surgeries:  Hysteroscopy/D & C, Endometrial biopsy   Last mammogram:  11-02-13 dense breasts/wnl:[name]               Last pap and high risk HPV testing:  03-31-09 wnl  History of abnormal pap smear:  no   OB History   Grav Para Term Preterm Abortions TAB SAB Ect Mult Living   3 2 2  1     2        LIFESTYLE: Exercise:   walking             OTHER HEALTH MAINTENANCE: Tetanus/TDap:  2011 HPV:                   n/a Influenza:            04/2014   Bone density:     2013 normal at Dr. Jacquiline Doe office. Colonoscopy:     03/2014 polyps with Dr. Henrene Pastor.  Next colonoscopy due 03/2019.  Cholesterol check:  Slightly elevated with medication.  Family History  Problem Relation Age of Onset  . Cancer Mother     Lung cancer  . Stroke Father 43  . Heart disease Father   . Hypertension Father   . Cancer Brother     Liver cancer  . Cancer Maternal Grandmother     Colon cancer  . Colon cancer Maternal Grandmother 20  . Heart attack Maternal Grandfather   . Heart attack Paternal Grandmother   . Alzheimer's disease Paternal Grandfather   . Heart disease Brother 28    Patient Active Problem List   Diagnosis Date Noted  . Palpitations 01/06/2013  . Anxiety 01/06/2013  . IDDM (insulin dependent diabetes mellitus) 01/06/2013  . LVH (left ventricular  hypertrophy) 01/06/2013  . Mitral regurgitation 01/06/2013  . Tricuspid regurgitation 01/06/2013  . Hyperlipidemia 01/06/2013   Past Medical History  Diagnosis Date  . Chest pain     2D ECHO, 05/29/2011 -EF >55%, normal; MYOVIEW, 05/29/2011 - normal  . Diabetes   . Hyperlipidemia   . Hypertension   . Bilateral cataracts   . Glaucoma   . Fracture, foot june 2014    left  . Depression   . Neuropathy     legs  . PMB (postmenopausal bleeding)   . Adenomatous colon polyp 2014    repeat colon in 5 years    Past Surgical History  Procedure Laterality Date  . Cholecystectomy  1969  . Cesarean section  1974  . Carpal tunnel release Bilateral   . Tubal ligation Bilateral   . Hysteroscopy  02-10-97    D&C, polyp--focal hyperplasia w/o atypia  . Endometrial biopsy  09-30-97    Dr Jeani Hawking Smith--benign    ALLERGIES: Erythromycin  Current Outpatient Prescriptions  Medication Sig Dispense Refill  . ACCU-CHEK  AVIVA PLUS test strip       . ALPRAZolam (XANAX) 0.25 MG tablet Take 0.25 mg by mouth every 8 (eight) hours.      Marland Kitchen amLODipine (NORVASC) 5 MG tablet TAKE 1 TABLET BY MOUTH DAILY  30 tablet  11  . DULoxetine (CYMBALTA) 30 MG capsule Take 30 mg by mouth daily.       Marland Kitchen HUMALOG 100 UNIT/ML injection       . Insulin Human (INSULIN PUMP) 100 unit/ml SOLN Inject into the skin.      Marland Kitchen latanoprost (XALATAN) 0.005 % ophthalmic solution Place 1 drop into both eyes at bedtime.      Marland Kitchen lisinopril (PRINIVIL,ZESTRIL) 40 MG tablet Take 40 mg by mouth daily.      . Multiple Vitamin (MULTIVITAMIN) tablet Take 1 tablet by mouth daily.      . rosuvastatin (CRESTOR) 10 MG tablet Take 10 mg by mouth at bedtime.      . traZODone (DESYREL) 50 MG tablet Take 50 mg by mouth at bedtime.      Marland Kitchen HYDROcodone-acetaminophen (NORCO/VICODIN) 5-325 MG per tablet Take 1 tablet by mouth as needed.       No current facility-administered medications for this visit.     ROS:  Pertinent items are noted in  HPI.  History   Social History  . Marital Status: Married    Spouse Name: N/A    Number of Children: 2  . Years of Education: N/A   Occupational History  . Not on file.   Social History Main Topics  . Smoking status: Never Smoker   . Smokeless tobacco: Never Used  . Alcohol Use: No  . Drug Use: No  . Sexual Activity: No   Other Topics Concern  . Not on file   Social History Narrative  . No narrative on file    PHYSICAL EXAMINATION:    BP 130/80  Pulse 66  Resp 16  Ht 5\' 3"  (1.6 m)  Wt 160 lb 9.6 oz (72.848 kg)  BMI 28.46 kg/m2  LMP 08/06/1999   Wt Readings from Last 3 Encounters:  04/29/14 160 lb 9.6 oz (72.848 kg)  03/23/14 160 lb 8 oz (72.802 kg)  04/28/13 155 lb 8 oz (70.534 kg)     Ht Readings from Last 3 Encounters:  04/29/14 5\' 3"  (1.6 m)  03/23/14 5\' 2"  (1.575 m)  04/28/13 5\' 3"  (1.6 m)    General appearance: alert, cooperative and appears stated age Head: Normocephalic, without obvious abnormality, atraumatic Neck: no adenopathy, supple, symmetrical, trachea midline and thyroid not enlarged, symmetric, no tenderness/mass/nodules Lungs: clear to auscultation bilaterally Breasts: Inspection negative, No nipple retraction or dimpling, No nipple discharge or bleeding, No axillary or supraclavicular adenopathy, Normal to palpation without dominant masses Heart: regular rate and rhythm Abdomen: soft, non-tender; no masses,  no organomegaly Extremities: extremities normal, atraumatic, no cyanosis or edema Skin: Skin color, texture, turgor normal. No rashes or lesions Lymph nodes: Cervical, supraclavicular, and axillary nodes normal. No abnormal inguinal nodes palpated Neurologic: Grossly normal  Pelvic: External genitalia:  Prominent clitoris. Perineum with whitish skin and fissures.              Urethra:  normal appearing urethra with no masses, tenderness or lesions              Bartholins and Skenes: normal                 Vagina: normal appearing  vagina with normal color and  discharge, no lesions              Cervix: normal appearance              Pap and high risk HPV testing done: No.        Bimanual Exam:  Uterus:  uterus is normal size, shape, consistency and nontender                                      Adnexa: normal adnexa in size, nontender and no masses                                      Rectovaginal:  Yes.                                        Confirms above.                                      Anus:  normal sphincter tone, no lesions  ASSESSMENT  Chronic vulvitis.  Possible lichen sclerosus.   PLAN  Mammogram recommended yearly starting at age 60. Pap smear and high risk HPV testing as above. Counseled on self breast exam, Calcium and vitamin D intake, exercise. See lab orders: No. Return for vulvar biopsy.  Procedure and reasoning explained to patient.  Return annually or prn   An After Visit Summary was printed and given to the patient.

## 2014-04-29 NOTE — Patient Instructions (Signed)

## 2014-05-05 ENCOUNTER — Telehealth: Payer: Self-pay | Admitting: Obstetrics and Gynecology

## 2014-05-05 DIAGNOSIS — N904 Leukoplakia of vulva: Secondary | ICD-10-CM

## 2014-05-05 HISTORY — DX: Leukoplakia of vulva: N90.4

## 2014-05-05 NOTE — Telephone Encounter (Signed)
Spoke with patient. Advised that per benefit quote received, she will be responsible to pay a $35 copay when she comes in for Vulvar bx. Patient agreeable. Scheduled procedure.

## 2014-05-20 ENCOUNTER — Encounter: Payer: Self-pay | Admitting: Obstetrics and Gynecology

## 2014-05-20 ENCOUNTER — Ambulatory Visit (INDEPENDENT_AMBULATORY_CARE_PROVIDER_SITE_OTHER): Payer: Medicare Other | Admitting: Obstetrics and Gynecology

## 2014-05-20 VITALS — BP 138/60 | HR 68 | Resp 16 | Ht 63.0 in | Wt 160.0 lb

## 2014-05-20 DIAGNOSIS — N9089 Other specified noninflammatory disorders of vulva and perineum: Secondary | ICD-10-CM

## 2014-05-20 NOTE — Patient Instructions (Signed)

## 2014-05-20 NOTE — Progress Notes (Signed)
GYNECOLOGY  VISIT   HPI: 68 y.o.   Married  Caucasian  female   [phone] with Patient's last menstrual period was 08/06/1999.   here for Vulvar Biopsy.  Has red and painful skin on her vulva. Uses hydrocortisone cream chronically.   GYNECOLOGIC HISTORY: Patient's last menstrual period was 08/06/1999. Contraception:  Post-Menopausal   Menopausal hormone therapy: N/A        OB History   Grav Para Term Preterm Abortions TAB SAB Ect Mult Living   3 2 2  1     2          Patient Active Problem List   Diagnosis Date Noted  . Palpitations 01/06/2013  . Anxiety 01/06/2013  . IDDM (insulin dependent diabetes mellitus) 01/06/2013  . LVH (left ventricular hypertrophy) 01/06/2013  . Mitral regurgitation 01/06/2013  . Tricuspid regurgitation 01/06/2013  . Hyperlipidemia 01/06/2013    Past Medical History  Diagnosis Date  . Chest pain     2D ECHO, 05/29/2011 -EF >55%, normal; MYOVIEW, 05/29/2011 - normal  . Diabetes   . Hyperlipidemia   . Hypertension   . Bilateral cataracts   . Glaucoma   . Fracture, foot june 2014    left  . Depression   . Neuropathy     legs  . PMB (postmenopausal bleeding)   . Adenomatous colon polyp 2014    repeat colon in 5 years    Past Surgical History  Procedure Laterality Date  . Cholecystectomy  1969  . Cesarean section  1974  . Carpal tunnel release Bilateral   . Tubal ligation Bilateral   . Hysteroscopy  02-10-97    D&C, polyp--focal hyperplasia w/o atypia  . Endometrial biopsy  09-30-97    Dr Ricard Dillon    Current Outpatient Prescriptions  Medication Sig Dispense Refill  . ACCU-CHEK AVIVA PLUS test strip       . ALPRAZolam (XANAX) 0.25 MG tablet Take 0.25 mg by mouth every 8 (eight) hours.      Marland Kitchen amLODipine (NORVASC) 5 MG tablet TAKE 1 TABLET BY MOUTH DAILY  30 tablet  11  . DULoxetine (CYMBALTA) 30 MG capsule Take 30 mg by mouth daily.       Marland Kitchen HUMALOG 100 UNIT/ML injection       . HYDROcodone-acetaminophen (NORCO/VICODIN)  5-325 MG per tablet Take 1 tablet by mouth as needed.      . Insulin Human (INSULIN PUMP) 100 unit/ml SOLN Inject into the skin.      Marland Kitchen latanoprost (XALATAN) 0.005 % ophthalmic solution Place 1 drop into both eyes at bedtime.      Marland Kitchen lisinopril (PRINIVIL,ZESTRIL) 40 MG tablet Take 40 mg by mouth daily.      . Multiple Vitamin (MULTIVITAMIN) tablet Take 1 tablet by mouth daily.      . rosuvastatin (CRESTOR) 10 MG tablet Take 10 mg by mouth at bedtime.      . traZODone (DESYREL) 50 MG tablet Take 50 mg by mouth at bedtime.       No current facility-administered medications for this visit.     ALLERGIES: Erythromycin  Family History  Problem Relation Age of Onset  . Cancer Mother     Lung cancer  . Stroke Father 75  . Heart disease Father   . Hypertension Father   . Cancer Brother     Liver cancer  . Cancer Maternal Grandmother     Colon cancer  . Colon cancer Maternal Grandmother 69  . Heart attack Maternal Grandfather   .  Heart attack Paternal Grandmother   . Alzheimer's disease Paternal Grandfather   . Heart disease Brother 20    History   Social History  . Marital Status: Married    Spouse Name: N/A    Number of Children: 2  . Years of Education: N/A   Occupational History  . Not on file.   Social History Main Topics  . Smoking status: Never Smoker   . Smokeless tobacco: Never Used  . Alcohol Use: No  . Drug Use: No  . Sexual Activity: No   Other Topics Concern  . Not on file   Social History Narrative  . No narrative on file    ROS:  Pertinent items are noted in HPI.  PHYSICAL EXAMINATION:    BP 138/60  Pulse 68  Resp 16  Ht 5\' 3"  (1.6 m)  Wt 160 lb (72.576 kg)  BMI 28.35 kg/m2  LMP 08/06/1999     General appearance: alert, cooperative and appears stated age    Pelvic: External genitalia:  Prominent clitoris with retracted hood.  Pale white thin vulvar and perineal skin.   Procedure Consent for biopsy.  Sterile prep - Hibiclens.  Local  lidocaine 1% - lot# [id number], Expiration 01/04/15. 4 mm punch biopsies used.  Specimen 1 - left labia majora - to path Specimen 2 - right perineum - to path AgNO3 used.  3/0 vicryl suture to each. Good hemostasis.  Minimal EBL.  No complications.               ASSESSMENT  Vulvar lesions - I suspect lichen sclerosus. History of DM.   PLAN  Follow up biopsies.  Return in 10 days for a recheck and discussion of biopsy results. Precautions given.    An After Visit Summary was printed and given to the patient.  __10____ minutes face to face time of which over 50% was spent in counseling.

## 2014-05-25 ENCOUNTER — Telehealth: Payer: Self-pay | Admitting: Obstetrics and Gynecology

## 2014-05-25 NOTE — Telephone Encounter (Signed)
Called patient, home phone rang and rang no answering machine to leave message.

## 2014-05-25 NOTE — Telephone Encounter (Signed)
Patient calling for recent biopsy results. Kelsey Pierce also wants to make sure Kelsey Pierce needs to keep her visit with Dr. Quincy Simmonds on 05/27/14 for a 10 day recheck.

## 2014-05-25 NOTE — Telephone Encounter (Signed)
Message copied by Michele Mcalpine on Wed May 25, 2014  4:30 PM ------      Message from: Todd, Woodward: Wed May 25, 2014  6:40 AM       Please inform patient of biopsy results both showing lichen sclerosus, which is what I expected and we had discussed.       Keep appointment for recheck and we will discuss a different steroid cream to help with her vulvar irritation symptoms.       Thanks. ------

## 2014-05-26 NOTE — Telephone Encounter (Signed)
Patient notified of message from Dr. Quincy Simmonds. Will follow up as discussed.  Routing to provider for final review. Patient agreeable to disposition. Will close encounter

## 2014-05-27 ENCOUNTER — Encounter: Payer: Self-pay | Admitting: Obstetrics and Gynecology

## 2014-05-27 ENCOUNTER — Ambulatory Visit (INDEPENDENT_AMBULATORY_CARE_PROVIDER_SITE_OTHER): Payer: Medicare Other | Admitting: Obstetrics and Gynecology

## 2014-05-27 VITALS — BP 120/72 | HR 70 | Ht 63.0 in | Wt 160.4 lb

## 2014-05-27 DIAGNOSIS — L900 Lichen sclerosus et atrophicus: Secondary | ICD-10-CM

## 2014-05-27 MED ORDER — CLOBETASOL PROPIONATE 0.05 % EX OINT: 1.0000 "application " | TOPICAL_OINTMENT | Freq: Two times a day (BID) | CUTANEOUS | Status: DC

## 2014-05-27 NOTE — Progress Notes (Signed)
Patient ID: Kelsey Pierce, female   DOB: 1945/09/21, 68 y.o.   MRN: [id number] GYNECOLOGY  VISIT   HPI: 68 y.o.   Married  Caucasian  female   [phone] with Patient's last menstrual period was 08/06/1999.   here for follow up after vulvar biopsy.   Biopsy for irritated vulva and chronic used of OTC steroid cream.  Biopsies showed lichen sclerosis with no evidence of dysplasia or malignancy.   GYNECOLOGIC HISTORY: Patient's last menstrual period was 08/06/1999. Contraception:  postmenopausal  Menopausal hormone therapy: none        OB History   Grav Para Term Preterm Abortions TAB SAB Ect Mult Living   3 2 2  1     2          Patient Active Problem List   Diagnosis Date Noted  . Palpitations 01/06/2013  . Anxiety 01/06/2013  . IDDM (insulin dependent diabetes mellitus) 01/06/2013  . LVH (left ventricular hypertrophy) 01/06/2013  . Mitral regurgitation 01/06/2013  . Tricuspid regurgitation 01/06/2013  . Hyperlipidemia 01/06/2013    Past Medical History  Diagnosis Date  . Chest pain     2D ECHO, 05/29/2011 -EF >55%, normal; MYOVIEW, 05/29/2011 - normal  . Diabetes   . Hyperlipidemia   . Hypertension   . Bilateral cataracts   . Glaucoma   . Fracture, foot june 2014    left  . Depression   . Neuropathy     legs  . PMB (postmenopausal bleeding)   . Adenomatous colon polyp 2014    repeat colon in 5 years  . Lichen sclerosus et atrophicus of the vulva 05/2014    Past Surgical History  Procedure Laterality Date  . Cholecystectomy  1969  . Cesarean section  1974  . Carpal tunnel release Bilateral   . Tubal ligation Bilateral   . Hysteroscopy  02-10-97    D&C, polyp--focal hyperplasia w/o atypia  . Endometrial biopsy  09-30-97    Dr Ricard Dillon    Current Outpatient Prescriptions  Medication Sig Dispense Refill  . ACCU-CHEK AVIVA PLUS test strip       . ALPRAZolam (XANAX) 0.25 MG tablet Take 0.25 mg by mouth every 8 (eight) hours.      Marland Kitchen amLODipine  (NORVASC) 5 MG tablet TAKE 1 TABLET BY MOUTH DAILY  30 tablet  11  . DULoxetine (CYMBALTA) 30 MG capsule Take 30 mg by mouth daily.       Marland Kitchen HUMALOG 100 UNIT/ML injection       . HYDROcodone-acetaminophen (NORCO/VICODIN) 5-325 MG per tablet Take 1 tablet by mouth as needed.      . Insulin Human (INSULIN PUMP) 100 unit/ml SOLN Inject into the skin.      Marland Kitchen latanoprost (XALATAN) 0.005 % ophthalmic solution Place 1 drop into both eyes at bedtime.      Marland Kitchen lisinopril (PRINIVIL,ZESTRIL) 40 MG tablet Take 40 mg by mouth daily.      . Multiple Vitamin (MULTIVITAMIN) tablet Take 1 tablet by mouth daily.      . rosuvastatin (CRESTOR) 10 MG tablet Take 10 mg by mouth at bedtime.      . traZODone (DESYREL) 50 MG tablet Take 50 mg by mouth at bedtime.       No current facility-administered medications for this visit.     ALLERGIES: Erythromycin  Family History  Problem Relation Age of Onset  . Cancer Mother     Lung cancer  . Stroke Father 39  . Heart disease Father   .  Hypertension Father   . Cancer Brother     Liver cancer  . Cancer Maternal Grandmother     Colon cancer  . Colon cancer Maternal Grandmother 76  . Heart attack Maternal Grandfather   . Heart attack Paternal Grandmother   . Alzheimer's disease Paternal Grandfather   . Heart disease Brother 56    History   Social History  . Marital Status: Married    Spouse Name: N/A    Number of Children: 2  . Years of Education: N/A   Occupational History  . Not on file.   Social History Main Topics  . Smoking status: Never Smoker   . Smokeless tobacco: Never Used  . Alcohol Use: No  . Drug Use: No  . Sexual Activity: No   Other Topics Concern  . Not on file   Social History Narrative  . No narrative on file    ROS:  Pertinent items are noted in HPI.  PHYSICAL EXAMINATION:    BP 120/72  Pulse 70  Ht 5\' 3"  (1.6 m)  Wt 160 lb 6.4 oz (72.757 kg)  BMI 28.42 kg/m2  LMP 08/06/1999     General appearance: alert,  cooperative and appears stated age  Pelvic: External genitalia:  Left labial suture - not present.  Right perineal suture present.  Fusion of the labia major and minora on each ipsilateral side. Clitoral prominence.  White line of tissue from Clitorus to perineum, across perineum, and back up the other side.                Urethra:  normal appearing urethra with no masses, tenderness or lesions                                                   ASSESSMENT  Lichen sclerosus.   PLAN  Discussion and written materials on lichen sclerosus. Clobetasol ointment to area bid. Patient instructed in use.  She understands that this is to control symptoms and not cure disease.  She understands that this dosage will be lessened likely after her recheck.   Return in 5 weeks.   An After Visit Summary was printed and given to the patient.  __15____ minutes face to face time of which over 50% was spent in counseling.

## 2014-05-27 NOTE — Patient Instructions (Addendum)
Lichen Sclerosus Lichen sclerosus is a skin problem. It can happen on any part of the body, but it commonly involves the anal or genital areas. Lichen sclerosus is not an infection or a fungus. Girls and women are more commonly affected than boys and men. CAUSES The cause is not known. It could be the result of an overactive immune system or a lack of certain hormones. Lichen sclerosus is not passed from one person to another (not contagious). SYMPTOMS Your skin may have:  Thin, wrinkled, white areas.  Thickened white areas.  Red and swollen patches.  Tears or cracks.  Bruising.  Blood blisters.  Severe itching. You may also have pain, itching, or burning with urination. Constipation is also common in people with lichen sclerosus. DIAGNOSIS Your caregiver will do a physical exam. Sometimes, a tissue sample (biopsy) may be sent for testing. TREATMENT Treatment may involve putting a thin layer of medicated cream (topical steroid) over the areas with lichen sclerosus. Use the cream only as directed by your caregiver.  HOME CARE INSTRUCTIONS  Only take over-the-counter or prescription medicines as directed by your caregiver.  Keep the vaginal area as clean and dry as possible. SEEK MEDICAL CARE IF: You develop increasing pain, swelling, or redness. Document Released: 12/12/2010 Document Revised: 10/14/2011 Document Reviewed: 12/12/2010 [facility] Patient Information 2015 [place], [place]. This information is not intended to replace advice given to you by your health care provider. Make sure you discuss any questions you have with your health care provider.  Use your ointment twice a day for the next 5 weeks until I see you again.

## 2014-05-28 ENCOUNTER — Encounter: Payer: Self-pay | Admitting: Obstetrics and Gynecology

## 2014-06-06 ENCOUNTER — Encounter: Payer: Self-pay | Admitting: Obstetrics and Gynecology

## 2014-07-07 ENCOUNTER — Encounter: Payer: Self-pay | Admitting: Obstetrics and Gynecology

## 2014-07-07 ENCOUNTER — Ambulatory Visit (INDEPENDENT_AMBULATORY_CARE_PROVIDER_SITE_OTHER): Payer: Medicare Other | Admitting: Obstetrics and Gynecology

## 2014-07-07 VITALS — BP 120/72 | HR 66 | Ht 63.0 in | Wt 156.0 lb

## 2014-07-07 DIAGNOSIS — L900 Lichen sclerosus et atrophicus: Secondary | ICD-10-CM

## 2014-07-07 MED ORDER — CLOBETASOL PROPIONATE 0.05 % EX OINT: 1.0000 "application " | TOPICAL_OINTMENT | Freq: Two times a day (BID) | CUTANEOUS | Status: DC

## 2014-07-07 NOTE — Progress Notes (Signed)
Patient ID: Kelsey Pierce, female   DOB: 06/06/1946, 68 y.o.   MRN: [id number] GYNECOLOGY  VISIT   HPI: 68 y.o.   Married  Caucasian  female   [phone] with Patient's last menstrual period was 08/06/1999.   here for follow up visit on Lichen sclerosus.   Biopsy for irritated vulva and chronic used of OTC steroid cream.  Biopsies showed lichen sclerosis with no evidence of dysplasia or malignancy.   Clobetasol ointment - Using twice a day.  Has less irritation of her vulvar.   Has noted a slight increase in her blood sugars.   GYNECOLOGIC HISTORY: Patient's last menstrual period was 08/06/1999. Contraception:  postmenopausal  Menopausal hormone therapy: none        OB History    Gravida Para Term Preterm AB TAB SAB Ectopic Multiple Living   3 2 2  1     2          Patient Active Problem List   Diagnosis Date Noted  . Palpitations 01/06/2013  . Anxiety 01/06/2013  . IDDM (insulin dependent diabetes mellitus) 01/06/2013  . LVH (left ventricular hypertrophy) 01/06/2013  . Mitral regurgitation 01/06/2013  . Tricuspid regurgitation 01/06/2013  . Hyperlipidemia 01/06/2013    Past Medical History  Diagnosis Date  . Chest pain     2D ECHO, 05/29/2011 -EF >55%, normal; MYOVIEW, 05/29/2011 - normal  . Diabetes   . Hyperlipidemia   . Hypertension   . Bilateral cataracts   . Glaucoma   . Fracture, foot june 2014    left  . Depression   . Neuropathy     legs  . PMB (postmenopausal bleeding)   . Adenomatous colon polyp 2014    repeat colon in 5 years  . Lichen sclerosus et atrophicus of the vulva 05/2014    Past Surgical History  Procedure Laterality Date  . Cholecystectomy  1969  . Cesarean section  1974  . Carpal tunnel release Bilateral   . Tubal ligation Bilateral   . Hysteroscopy  02-10-97    D&C, polyp--focal hyperplasia w/o atypia  . Endometrial biopsy  09-30-97    Dr Ricard Dillon    Current Outpatient Prescriptions  Medication Sig Dispense Refill  .  ACCU-CHEK AVIVA PLUS test strip     . ALPRAZolam (XANAX) 0.25 MG tablet Take 0.25 mg by mouth every 8 (eight) hours.    Marland Kitchen amLODipine (NORVASC) 5 MG tablet TAKE 1 TABLET BY MOUTH DAILY 30 tablet 11  . clobetasol ointment (TEMOVATE) 7.84 % Apply 1 application topically 2 (two) times daily. 30 g 1  . DULoxetine (CYMBALTA) 30 MG capsule Take 30 mg by mouth daily.     Marland Kitchen HUMALOG 100 UNIT/ML injection     . HYDROcodone-acetaminophen (NORCO/VICODIN) 5-325 MG per tablet Take 1 tablet by mouth as needed.    . Insulin Human (INSULIN PUMP) 100 unit/ml SOLN Inject into the skin.    Marland Kitchen latanoprost (XALATAN) 0.005 % ophthalmic solution Place 1 drop into both eyes at bedtime.    Marland Kitchen lisinopril (PRINIVIL,ZESTRIL) 40 MG tablet Take 40 mg by mouth daily.    . Multiple Vitamin (MULTIVITAMIN) tablet Take 1 tablet by mouth daily.    . rosuvastatin (CRESTOR) 10 MG tablet Take 10 mg by mouth at bedtime.    . traZODone (DESYREL) 50 MG tablet Take 50 mg by mouth at bedtime.     No current facility-administered medications for this visit.     ALLERGIES: Erythromycin  Family History  Problem Relation Age  of Onset  . Cancer Mother     Lung cancer  . Stroke Father 19  . Heart disease Father   . Hypertension Father   . Cancer Brother     Liver cancer  . Cancer Maternal Grandmother     Colon cancer  . Colon cancer Maternal Grandmother 68  . Heart attack Maternal Grandfather   . Heart attack Paternal Grandmother   . Alzheimer's disease Paternal Grandfather   . Heart disease Brother 65    History   Social History  . Marital Status: Married    Spouse Name: N/A    Number of Children: 2  . Years of Education: N/A   Occupational History  . Not on file.   Social History Main Topics  . Smoking status: Never Smoker   . Smokeless tobacco: Never Used  . Alcohol Use: No  . Drug Use: No  . Sexual Activity: No   Other Topics Concern  . Not on file   Social History Narrative    ROS:  Pertinent items are  noted in HPI.  PHYSICAL EXAMINATION:    BP 120/72 mmHg  Pulse 66  Ht 5\' 3"  (1.6 m)  Wt 156 lb (70.761 kg)  BMI 27.64 kg/m2  LMP 08/06/1999     General appearance: alert, cooperative and appears stated age   Pelvic: External genitalia:  no lesions.  Prominent clitoris.  No ulceration noted.   Light pink epithelium color to the labia.                                            ASSESSMENT  Lichen sclerosus of the vulva.  Improved.   PLAN  Decrease clobetasol use to once daily for one month and then reduce to twice weekly.  Refills given.  See orders.  Return for a recheck in 2 months.     An After Visit Summary was printed and given to the patient.  __15____ minutes face to face time of which over 50% was spent in counseling.

## 2014-08-05 HISTORY — PX: YAG LASER APPLICATION: SHX6189

## 2014-08-25 ENCOUNTER — Other Ambulatory Visit: Payer: Self-pay | Admitting: Obstetrics and Gynecology

## 2014-08-25 NOTE — Telephone Encounter (Addendum)
Medication refill request: Clobetasol Ointment 0.05% Last AEX:  04/29/14 with Dr. Quincy Simmonds Next AEX: 05/10/15 with Dr. Quincy Simmonds Last MMG (if hormonal medication request): N/A Refill authorized: #30 g/? Rfs, please advise.  (Routed to Dr. Sabra Heck since Dr. Quincy Simmonds is out of office today.)

## 2014-09-14 ENCOUNTER — Encounter: Payer: Self-pay | Admitting: Obstetrics and Gynecology

## 2014-09-14 ENCOUNTER — Ambulatory Visit (INDEPENDENT_AMBULATORY_CARE_PROVIDER_SITE_OTHER): Payer: Medicare Other | Admitting: Obstetrics and Gynecology

## 2014-09-14 VITALS — BP 138/62 | HR 80 | Ht 63.0 in | Wt 158.6 lb

## 2014-09-14 DIAGNOSIS — N904 Leukoplakia of vulva: Secondary | ICD-10-CM

## 2014-09-14 MED ORDER — CLOBETASOL PROPIONATE 0.05 % EX OINT
1.0000 | TOPICAL_OINTMENT | CUTANEOUS | Status: DC
Start: 2014-09-14 — End: 2015-05-10

## 2014-09-14 NOTE — Progress Notes (Signed)
Patient ID: Kelsey Pierce, female   DOB: 1946-08-05, 69 y.o.   MRN: [id number] GYNECOLOGY  VISIT   HPI: 69 y.o.   Married  Caucasian  female   [phone] with Patient's last menstrual period was 08/06/1999.   here for recheck on Lichen Sclerosus     Biopsy for irritated vulva and chronic used of OTC steroid cream.   Biopsies showed lichen sclerosis with no evidence of dysplasia or malignancy.    Clobetasol ointment - Using now once a day.  No burning or itching.  No ulcerative area.  No pain.   Has diabetes.  Blood sugars are improved but having low levels now.  Will now get a sensor for the low levels.    GYNECOLOGIC HISTORY: Patient's last menstrual period was 08/06/1999. Contraception:  postmenopausal  Menopausal hormone therapy: none        OB History    Gravida Para Term Preterm AB TAB SAB Ectopic Multiple Living   3 2 2  1     2          Patient Active Problem List   Diagnosis Date Noted  . Palpitations 01/06/2013  . Anxiety 01/06/2013  . IDDM (insulin dependent diabetes mellitus) 01/06/2013  . LVH (left ventricular hypertrophy) 01/06/2013  . Mitral regurgitation 01/06/2013  . Tricuspid regurgitation 01/06/2013  . Hyperlipidemia 01/06/2013    Past Medical History  Diagnosis Date  . Chest pain     2D ECHO, 05/29/2011 -EF >55%, normal; MYOVIEW, 05/29/2011 - normal  . Diabetes   . Hyperlipidemia   . Hypertension   . Bilateral cataracts   . Glaucoma   . Fracture, foot june 2014    left  . Depression   . Neuropathy     legs  . PMB (postmenopausal bleeding)   . Adenomatous colon polyp 2014    repeat colon in 5 years  . Lichen sclerosus et atrophicus of the vulva 05/2014    Past Surgical History  Procedure Laterality Date  . Cholecystectomy  1969  . Cesarean section  1974  . Carpal tunnel release Bilateral   . Tubal ligation Bilateral   . Hysteroscopy  02-10-97    D&C, polyp--focal hyperplasia w/o atypia  . Endometrial biopsy  09-30-97    Dr Ricard Dillon    Current Outpatient Prescriptions  Medication Sig Dispense Refill  . ACCU-CHEK AVIVA PLUS test strip     . ALPRAZolam (XANAX) 0.25 MG tablet Take 0.25 mg by mouth every 8 (eight) hours.    Marland Kitchen amLODipine (NORVASC) 5 MG tablet TAKE 1 TABLET BY MOUTH DAILY 30 tablet 11  . clobetasol ointment (TEMOVATE) 0.71 % Apply 1 application topically 2 (two) times daily. 30 g 1  . clobetasol ointment (TEMOVATE) 2.19 % APPLY 1 APPLICATION TOPICALLY 2 (TWO) TIMES DAILY. 30 g 0  . DULoxetine (CYMBALTA) 30 MG capsule Take 30 mg by mouth daily.     Marland Kitchen HUMALOG 100 UNIT/ML injection     . HYDROcodone-acetaminophen (NORCO/VICODIN) 5-325 MG per tablet Take 1 tablet by mouth as needed.    . Insulin Human (INSULIN PUMP) 100 unit/ml SOLN Inject into the skin.    Marland Kitchen latanoprost (XALATAN) 0.005 % ophthalmic solution Place 1 drop into both eyes at bedtime.    Marland Kitchen lisinopril (PRINIVIL,ZESTRIL) 40 MG tablet Take 40 mg by mouth daily.    . Multiple Vitamin (MULTIVITAMIN) tablet Take 1 tablet by mouth daily.    . rosuvastatin (CRESTOR) 10 MG tablet Take 10 mg by mouth at bedtime.    Marland Kitchen  traZODone (DESYREL) 50 MG tablet Take 50 mg by mouth at bedtime.     No current facility-administered medications for this visit.     ALLERGIES: Erythromycin  Family History  Problem Relation Age of Onset  . Cancer Mother     Lung cancer  . Stroke Father 50  . Heart disease Father   . Hypertension Father   . Cancer Brother     Liver cancer  . Cancer Maternal Grandmother     Colon cancer  . Colon cancer Maternal Grandmother 58  . Heart attack Maternal Grandfather   . Heart attack Paternal Grandmother   . Alzheimer's disease Paternal Grandfather   . Heart disease Brother 68    History   Social History  . Marital Status: Married    Spouse Name: N/A  . Number of Children: 2  . Years of Education: N/A   Occupational History  . Not on file.   Social History Main Topics  . Smoking status: Never Smoker   .  Smokeless tobacco: Never Used  . Alcohol Use: No  . Drug Use: No  . Sexual Activity: No   Other Topics Concern  . Not on file   Social History Narrative    ROS:  Pertinent items are noted in HPI.  PHYSICAL EXAMINATION:    BP 138/62 mmHg  Pulse 80  Ht 5\' 3"  (1.6 m)  Wt 158 lb 9.6 oz (71.94 kg)  BMI 28.10 kg/m2  LMP 08/06/1999     General appearance: alert, cooperative and appears stated age    Pelvic: External genitalia:  no lesions.  Prominent clitoris.  Skin of the vulva and perineum and perianal region is now more pink and peach.  No ulcerations or hemorrhagic areas.              Urethra:  normal appearing urethra with no masses, tenderness or lesions              Bartholins and Skenes: normal                 Vagina: normal appearing vagina with normal color and discharge, no lesions              Cervix: normal appearance                   Bimanual Exam:  Uterus:  uterus is normal size, shape, consistency and nontender                                      Adnexa: normal adnexa in size, nontender and no masses                                       ASSESSMENT  Lichen sclerosus of the vulva.  Symptom free.   Good response to clobetasol ointment.  Diabetes mellitus.   PLAN  Reduce clobetasol ointment to twice a week.  New Rx given with new instructions.  OK to give a trial to being off clobetasol if forgets to use and doing well.  Discussed waxing and waning chronic nature of lichen sclerosus.  Discussed association with vulvar dysplasia.  I recommend yearly visits for rechecks. Watch for lows in blood sugar levels.  Inform your diabetes doctor of the change in your clobetasol which may causes changes in your  blood sugar levels.   Return for annual exam in October 2016 for annual exam.    An After Visit Summary was printed and given to the patient.  15______ minutes face to face time of which over 50% was spent in counseling.

## 2014-09-14 NOTE — Patient Instructions (Signed)
Please reduce your use of the Clobetasol ointment to twice a week to control symptoms.   Please check you blood sugars carefully as you may experience lowered levels due to the reduction in steroid usage. You will need to inform you doctor managing your diabetes so they understand the changes you are likely to experience.

## 2015-01-14 ENCOUNTER — Other Ambulatory Visit: Payer: Self-pay | Admitting: Obstetrics & Gynecology

## 2015-01-16 NOTE — Telephone Encounter (Signed)
Medication refill request: clobetasol ointment Last AEX:  04-29-14 Next AEX: 05-10-15  Last MMG (if hormonal medication request):  Refill authorized: please advise

## 2015-01-28 ENCOUNTER — Emergency Department (HOSPITAL_BASED_OUTPATIENT_CLINIC_OR_DEPARTMENT_OTHER)
Admission: EM | Admit: 2015-01-28 | Discharge: 2015-01-28 | Disposition: A | Discharge status: Home / Self Care | Payer: Medicare Other | Attending: Emergency Medicine | Admitting: Emergency Medicine

## 2015-01-28 ENCOUNTER — Emergency Department (HOSPITAL_BASED_OUTPATIENT_CLINIC_OR_DEPARTMENT_OTHER): Payer: Medicare Other

## 2015-01-28 ENCOUNTER — Encounter (HOSPITAL_BASED_OUTPATIENT_CLINIC_OR_DEPARTMENT_OTHER): Payer: Self-pay

## 2015-01-28 DIAGNOSIS — Z23 Encounter for immunization: Secondary | ICD-10-CM | POA: Insufficient documentation

## 2015-01-28 DIAGNOSIS — Z79899 Other long term (current) drug therapy: Secondary | ICD-10-CM | POA: Insufficient documentation

## 2015-01-28 DIAGNOSIS — G629 Polyneuropathy, unspecified: Secondary | ICD-10-CM | POA: Insufficient documentation

## 2015-01-28 DIAGNOSIS — S0181XA Laceration without foreign body of other part of head, initial encounter: Secondary | ICD-10-CM | POA: Diagnosis present

## 2015-01-28 DIAGNOSIS — E11649 Type 2 diabetes mellitus with hypoglycemia without coma: Secondary | ICD-10-CM | POA: Diagnosis not present

## 2015-01-28 DIAGNOSIS — Y9389 Activity, other specified: Secondary | ICD-10-CM | POA: Insufficient documentation

## 2015-01-28 DIAGNOSIS — Y9289 Other specified places as the place of occurrence of the external cause: Secondary | ICD-10-CM | POA: Insufficient documentation

## 2015-01-28 DIAGNOSIS — Z86010 Personal history of colonic polyps: Secondary | ICD-10-CM | POA: Diagnosis not present

## 2015-01-28 DIAGNOSIS — E162 Hypoglycemia, unspecified: Secondary | ICD-10-CM

## 2015-01-28 DIAGNOSIS — Z794 Long term (current) use of insulin: Secondary | ICD-10-CM | POA: Diagnosis not present

## 2015-01-28 DIAGNOSIS — I10 Essential (primary) hypertension: Secondary | ICD-10-CM | POA: Diagnosis not present

## 2015-01-28 DIAGNOSIS — Z8742 Personal history of other diseases of the female genital tract: Secondary | ICD-10-CM | POA: Insufficient documentation

## 2015-01-28 DIAGNOSIS — Z8781 Personal history of (healed) traumatic fracture: Secondary | ICD-10-CM | POA: Diagnosis not present

## 2015-01-28 DIAGNOSIS — H409 Unspecified glaucoma: Secondary | ICD-10-CM | POA: Insufficient documentation

## 2015-01-28 DIAGNOSIS — W19XXXA Unspecified fall, initial encounter: Secondary | ICD-10-CM

## 2015-01-28 DIAGNOSIS — F329 Major depressive disorder, single episode, unspecified: Secondary | ICD-10-CM | POA: Insufficient documentation

## 2015-01-28 DIAGNOSIS — W01198A Fall on same level from slipping, tripping and stumbling with subsequent striking against other object, initial encounter: Secondary | ICD-10-CM | POA: Insufficient documentation

## 2015-01-28 DIAGNOSIS — E785 Hyperlipidemia, unspecified: Secondary | ICD-10-CM | POA: Diagnosis not present

## 2015-01-28 DIAGNOSIS — H269 Unspecified cataract: Secondary | ICD-10-CM | POA: Diagnosis not present

## 2015-01-28 DIAGNOSIS — Y998 Other external cause status: Secondary | ICD-10-CM | POA: Diagnosis not present

## 2015-01-28 LAB — BASIC METABOLIC PANEL WITH GFR
Anion gap: 8 (ref 5–15)
BUN: 20 mg/dL (ref 6–20)
CO2: 27 mmol/L (ref 22–32)
Calcium: 9.8 mg/dL (ref 8.9–10.3)
Chloride: 105 mmol/L (ref 101–111)
Creatinine, Ser: 0.96 mg/dL (ref 0.44–1.00)
GFR calc Af Amer: >60 mL/min (ref >60)
GFR calc non Af Amer: 59 mL/min — ABNORMAL LOW (ref >60)
Glucose, Bld: 90 mg/dL (ref 65–99)
Potassium: 3.8 mmol/L (ref 3.5–5.1)
Sodium: 140 mmol/L (ref 135–145)

## 2015-01-28 LAB — CBC WITH DIFFERENTIAL/PLATELET
Basophils Absolute: 0 K/uL (ref 0.0–0.1)
Basophils Relative: 0 % (ref 0–1)
Eosinophils Absolute: 0 K/uL (ref 0.0–0.7)
Eosinophils Relative: 0 % (ref 0–5)
HCT: 39.4 % (ref 36.0–46.0)
Hemoglobin: 13.3 g/dL (ref 12.0–15.0)
Lymphocytes Relative: 9 % — ABNORMAL LOW (ref 12–46)
Lymphs Abs: 1.3 K/uL (ref 0.7–4.0)
MCH: 30.2 pg (ref 26.0–34.0)
MCHC: 33.8 g/dL (ref 30.0–36.0)
MCV: 89.3 fL (ref 78.0–100.0)
Monocytes Absolute: 0.6 K/uL (ref 0.1–1.0)
Monocytes Relative: 4 % (ref 3–12)
Neutro Abs: 13.1 K/uL — ABNORMAL HIGH (ref 1.7–7.7)
Neutrophils Relative %: 87 % — ABNORMAL HIGH (ref 43–77)
Platelets: 340 K/uL (ref 150–400)
RBC: 4.41 MIL/uL (ref 3.87–5.11)
RDW: 13.1 % (ref 11.5–15.5)
WBC: 15 K/uL — ABNORMAL HIGH (ref 4.0–10.5)

## 2015-01-28 LAB — CBG MONITORING, ED
Glucose-Capillary: 91 mg/dL (ref 65–99)
Glucose-Capillary: 86 mg/dL (ref 65–99)
Glucose-Capillary: 93 mg/dL (ref 65–99)

## 2015-01-28 MED ORDER — LIDOCAINE-EPINEPHRINE (PF) 2 %-1:200000 IJ SOLN
10.0000 mL | Freq: Once | INTRAMUSCULAR | Status: AC
  Administered 2015-01-28: 10 mL
  Filled 2015-01-28: qty 10

## 2015-01-28 MED ORDER — TETANUS-DIPHTH-ACELL PERTUSSIS 5-2.5-18.5 LF-MCG/0.5 IM SUSP
0.5000 mL | Freq: Once | INTRAMUSCULAR | Status: AC
  Administered 2015-01-28: 0.5 mL via INTRAMUSCULAR
  Filled 2015-01-28: qty 0.5

## 2015-01-28 NOTE — ED Provider Notes (Signed)
CSN: [id number]     Arrival date & time 01/28/15  1421 History   First MD Initiated Contact with Patient 01/28/15 1605     Chief Complaint  Patient presents with  . fall-laceration      (Consider location/radiation/quality/duration/timing/severity/associated sxs/prior Treatment) HPI Comments: Patient presents today after a fall that occurred around 1 PM today.  She is unsure what caused the fall.  She states that she was outside mowing the lawn and ran inside to answer the phone.  She then fell forward and hit her head on the hardwood floor.  She does not remember tripping.  She does not think that she loss consciousness.  Fall was unwitnessed.  Her daughter arrived at the home a few minutes later and found the patient conscious.  Daughter reports that she checked the patient's blood sugar at that time and found that it was 37.  She then gave the patient cheese and crackers, a candy bar, and 2 sugar tablets.  Blood sugar upon arrival in the ED was 91.  Patient is diabetic and has an insulin pump.  Patient states that she was at her normal state of health prior to the fall.  She is not on any anticoagulants.  She denies vision changes, dizziness, nausea, vomiting, chest pain, SOB, focal weakness, numbness or tingling.  She has been ambulatory since the fall.  Denies any neck pain, back pain, or extremity pain.  The history is provided by the patient.    Past Medical History  Diagnosis Date  . Chest pain     2D ECHO, 05/29/2011 -EF >55%, normal; MYOVIEW, 05/29/2011 - normal  . Diabetes   . Hyperlipidemia   . Hypertension   . Bilateral cataracts   . Glaucoma   . Fracture, foot june 2014    left  . Depression   . Neuropathy     legs  . PMB (postmenopausal bleeding)   . Adenomatous colon polyp 2014    repeat colon in 5 years  . Lichen sclerosus et atrophicus of the vulva 05/2014   Past Surgical History  Procedure Laterality Date  . Cholecystectomy  1969  . Cesarean section  1974  .  Carpal tunnel release Bilateral   . Tubal ligation Bilateral   . Hysteroscopy  02-10-97    D&C, polyp--focal hyperplasia w/o atypia  . Endometrial biopsy  09-30-97    Dr Ricard Dillon   Family History  Problem Relation Age of Onset  . Cancer Mother     Lung cancer  . Stroke Father 43  . Heart disease Father   . Hypertension Father   . Cancer Brother     Liver cancer  . Cancer Maternal Grandmother     Colon cancer  . Colon cancer Maternal Grandmother 65  . Heart attack Maternal Grandfather   . Heart attack Paternal Grandmother   . Alzheimer's disease Paternal Grandfather   . Heart disease Brother 50   History  Substance Use Topics  . Smoking status: Never Smoker   . Smokeless tobacco: Never Used  . Alcohol Use: No   OB History    Gravida Para Term Preterm AB TAB SAB Ectopic Multiple Living   3 2 2  1     2      Review of Systems  All other systems reviewed and are negative.     Allergies  Erythromycin  Home Medications   Prior to Admission medications   Medication Sig Start Date End Date Taking? Authorizing Provider  ACCU-CHEK  AVIVA PLUS test strip  03/03/13   Historical Provider, MD  ALPRAZolam (XANAX) 0.25 MG tablet Take 0.25 mg by mouth every 8 (eight) hours.    Historical Provider, MD  amLODipine (NORVASC) 5 MG tablet TAKE 1 TABLET BY MOUTH DAILY 04/12/14   Pixie Casino, MD  clobetasol ointment (TEMOVATE) 9.35 % Apply 1 application topically 2 (two) times a week. Use to control symptoms twice a week. 09/14/14   [place], MD  clobetasol ointment (TEMOVATE) 7.01 % APPLY 1 APPLICATION TOPICALLY 2 (TWO) TIMES DAILY. 01/16/15   Megan Salon, MD  DULoxetine (CYMBALTA) 30 MG capsule Take 30 mg by mouth daily.     Historical Provider, MD  HUMALOG 100 UNIT/ML injection  03/15/13   Historical Provider, MD  HYDROcodone-acetaminophen (NORCO/VICODIN) 5-325 MG per tablet Take 1 tablet by mouth as needed. 04/20/14   Historical Provider, MD  Insulin Human  (INSULIN PUMP) 100 unit/ml SOLN Inject into the skin.    Historical Provider, MD  latanoprost (XALATAN) 0.005 % ophthalmic solution Place 1 drop into both eyes at bedtime.    Historical Provider, MD  lisinopril (PRINIVIL,ZESTRIL) 40 MG tablet Take 40 mg by mouth daily.    Historical Provider, MD  Multiple Vitamin (MULTIVITAMIN) tablet Take 1 tablet by mouth daily.    Historical Provider, MD  rosuvastatin (CRESTOR) 10 MG tablet Take 10 mg by mouth at bedtime.    Historical Provider, MD  traZODone (DESYREL) 50 MG tablet Take 50 mg by mouth at bedtime.    Historical Provider, MD   BP 174/62 mmHg  Pulse 86  Temp(Src) 98.1 F (36.7 C) (Oral)  Resp 18  Wt 158 lb (71.668 kg)  SpO2 100%  LMP 08/06/1999 Physical Exam  Constitutional: She appears well-developed and well-nourished.  HENT:  Head: Normocephalic.    Mouth/Throat: Oropharynx is clear and moist.  Eyes: EOM are normal. Pupils are equal, round, and reactive to light.  Neck: Normal range of motion. Neck supple.  Cardiovascular: Normal rate, regular rhythm and normal heart sounds.   Pulmonary/Chest: Effort normal and breath sounds normal.  Abdominal: Soft. Bowel sounds are normal. There is no tenderness.  Musculoskeletal: Normal range of motion.       Cervical back: She exhibits normal range of motion, no tenderness, no bony tenderness, no swelling, no edema and no deformity.       Thoracic back: She exhibits normal range of motion, no tenderness, no bony tenderness, no swelling, no edema and no deformity.       Lumbar back: She exhibits normal range of motion, no tenderness, no bony tenderness, no swelling, no edema and no deformity.  Full ROM of all extremities without pain  Neurological: She is alert. She has normal strength. No cranial nerve deficit or sensory deficit. Coordination and gait normal.  Skin: Skin is warm and dry.  Nursing note and vitals reviewed.   ED Course  Procedures (including critical care time) Labs  Review Labs Reviewed  CBC WITH DIFFERENTIAL/PLATELET  BASIC METABOLIC PANEL  CBG MONITORING, ED  CBG MONITORING, ED    Imaging Review No results found.   EKG Interpretation None     LACERATION REPAIR Performed by: Hyman Bible Authorized by: Hyman Bible Consent: Verbal consent obtained. Risks and benefits: risks, benefits and alternatives were discussed Consent given by: patient Patient identity confirmed: provided demographic data Prepped and Draped in normal sterile fashion Wound explored  Laceration Location: forehead  Laceration Length: 4.5 cm  No Foreign Bodies seen  or palpated  Anesthesia: local infiltration  Local anesthetic: lidocaine 2% with epinephrine  Anesthetic total: 9 ml  Irrigation method: syringe Amount of cleaning: standard  Skin closure: 6-0 Nylon  Number of sutures: 8  Technique: simple interrupted  Patient tolerance: Patient tolerated the procedure well with no immediate complications.   4:22 CBG is 86.    7:37 CBG is 93 MDM   Final diagnoses:  None   Patient presents today with a laceration of the forehead after a fall.  Her daughter found her to be hypoglycemic at that time.  Blood sugar stable upon arrival in the ED.  Labs today unremarkable.  CT head is negative.  Laceration repaired in the ED without difficulty.  Patient ambulatory at the time of discharge with no dizziness or other symptoms.  Blood sugar remained stable throughout ED course.  Feel that the patient is stable for discharge.  Return precautions given.      Hyman Bible, PA-C 01/30/15 [phone]  Hyman Bible, PA-C 01/30/15 [phone]  Charlesetta Shanks, MD 01/31/15 [phone]

## 2015-01-28 NOTE — ED Notes (Signed)
Patient here with large laceration to left forehead after becoming hypoglycemic and falling in doorway, questionable loc. Daughter reports that BS was 37 at home. Patient denies blurred vision, no nausea, bleeding controlled

## 2015-01-28 NOTE — ED Notes (Signed)
I took CBG and got result of 93 mg./dcltr.

## 2015-01-28 NOTE — ED Notes (Signed)
Pt has insulin pump, which is working appropriately (per pt). Connye Burkitt, RN

## 2015-01-28 NOTE — ED Notes (Signed)
Suturing was completed around 1935. Since that time, pt has ambulated without difficulty -- no dizziness and wound showed only scant serosanguinous drainage. Connye Burkitt, RN

## 2015-01-28 NOTE — ED Provider Notes (Signed)
Medical screening examination/treatment/procedure(s) were conducted as a shared visit with non-physician practitioner(s) and myself.  I personally evaluated the patient during the encounter.   EKG Interpretation None      Patient seen by me. Patient 69 year old female that had a fall resulting in a laceration to her left for head area. The laceration measures 4.5 cm it is a linear it is actively bleeding. We'll require a suture repair.  At the time of the fall the patient was noted to have a low blood sugar it was 37. Patient's daughter gave her of some snacks and blood sugar of came up. Blood sugar upon arrival here was 91 and I think we just rechecked it and it was still in the 80s. Patient denies any other injuries. Some basic lab work will be done. Patient denies any significant headache any significant hip pain now no blurred vision no nausea. Patient is not on any blood thinners. Patient does have an insulin pump.  Tetanus was not up-to-date and is being given today.  In addition head CT is been ordered and results are pending.   Results for orders placed or performed during the hospital encounter of 01/28/15  CBG monitoring, ED  Result Value Ref Range   Glucose-Capillary 91 65 - 99 mg/dL  CBG monitoring, ED  Result Value Ref Range   Glucose-Capillary 86 65 - 99 mg/dL   No results found.    Fredia Sorrow, MD 01/28/15 [phone]

## 2015-01-31 ENCOUNTER — Telehealth: Payer: Self-pay | Admitting: Internal Medicine

## 2015-01-31 NOTE — Telephone Encounter (Signed)
Closed encounter °

## 2015-02-06 ENCOUNTER — Encounter (HOSPITAL_BASED_OUTPATIENT_CLINIC_OR_DEPARTMENT_OTHER): Payer: Self-pay | Admitting: Emergency Medicine

## 2015-02-06 ENCOUNTER — Emergency Department (HOSPITAL_BASED_OUTPATIENT_CLINIC_OR_DEPARTMENT_OTHER)
Admission: EM | Admit: 2015-02-06 | Discharge: 2015-02-06 | Disposition: A | Discharge status: Home / Self Care | Payer: Medicare Other | Attending: Emergency Medicine | Admitting: Emergency Medicine

## 2015-02-06 DIAGNOSIS — E785 Hyperlipidemia, unspecified: Secondary | ICD-10-CM | POA: Insufficient documentation

## 2015-02-06 DIAGNOSIS — Z87448 Personal history of other diseases of urinary system: Secondary | ICD-10-CM | POA: Diagnosis not present

## 2015-02-06 DIAGNOSIS — Z79899 Other long term (current) drug therapy: Secondary | ICD-10-CM | POA: Diagnosis not present

## 2015-02-06 DIAGNOSIS — F329 Major depressive disorder, single episode, unspecified: Secondary | ICD-10-CM | POA: Insufficient documentation

## 2015-02-06 DIAGNOSIS — G629 Polyneuropathy, unspecified: Secondary | ICD-10-CM | POA: Diagnosis not present

## 2015-02-06 DIAGNOSIS — I10 Essential (primary) hypertension: Secondary | ICD-10-CM | POA: Insufficient documentation

## 2015-02-06 DIAGNOSIS — Z86010 Personal history of colonic polyps: Secondary | ICD-10-CM | POA: Insufficient documentation

## 2015-02-06 DIAGNOSIS — Z8669 Personal history of other diseases of the nervous system and sense organs: Secondary | ICD-10-CM | POA: Diagnosis not present

## 2015-02-06 DIAGNOSIS — Z4802 Encounter for removal of sutures: Secondary | ICD-10-CM | POA: Diagnosis not present

## 2015-02-06 DIAGNOSIS — E119 Type 2 diabetes mellitus without complications: Secondary | ICD-10-CM | POA: Insufficient documentation

## 2015-02-06 DIAGNOSIS — Z8781 Personal history of (healed) traumatic fracture: Secondary | ICD-10-CM | POA: Insufficient documentation

## 2015-02-06 DIAGNOSIS — Z794 Long term (current) use of insulin: Secondary | ICD-10-CM | POA: Insufficient documentation

## 2015-02-06 NOTE — Discharge Instructions (Signed)
Suture Removal, Care After Refer to this sheet in the next few weeks. These instructions provide you with information on caring for yourself after your procedure. Your health care provider may also give you more specific instructions. Your treatment has been planned according to current medical practices, but problems sometimes occur. Call your health care provider if you have any problems or questions after your procedure. WHAT TO EXPECT AFTER THE PROCEDURE After your stitches (sutures) are removed, it is typical to have the following:  Some discomfort and swelling in the wound area.  Slight redness in the area. HOME CARE INSTRUCTIONS   If you have skin adhesive strips over the wound area, do not take the strips off. They will fall off on their own in a few days. If the strips remain in place after 14 days, you may remove them.  Change any bandages (dressings) at least once a day or as directed by your health care provider. If the bandage sticks, soak it off with warm, soapy water.  Apply cream or ointment only as directed by your health care provider. If using cream or ointment, wash the area with soap and water 2 times a day to remove all the cream or ointment. Rinse off the soap and pat the area dry with a clean towel.  Keep the wound area dry and clean. If the bandage becomes wet or dirty, or if it develops a bad smell, change it as soon as possible.  Continue to protect the wound from injury.  Use sunscreen when out in the sun. New scars become sunburned easily. SEEK MEDICAL CARE IF:  You have increasing redness, swelling, or pain in the wound.  You see pus coming from the wound.  You have a fever.  You notice a bad smell coming from the wound or dressing.  Your wound breaks open (edges not staying together). Document Released: 04/16/2001 Document Revised: 05/12/2013 Document Reviewed: 03/03/2013 [facility] Patient Information 2015 [place], [place]. This information is not  intended to replace advice given to you by your health care provider. Make sure you discuss any questions you have with your health care provider.  Scar Minimization You will have a scar anytime you have surgery and a cut is made in the skin or you have something removed from your skin (mole, skin cancer, cyst). Although scars are unavoidable following surgery, there are ways to minimize their appearance. It is important to follow all the instructions you receive from your caregiver about wound care. How your wound heals will influence the appearance of your scar. If you do not follow the wound care instructions as directed, complications such as infection may occur. Wound instructions include keeping the wound clean, moist, and not letting the wound form a scab. Some people form scars that are raised and lumpy (hypertrophic) or larger than the initial wound (keloidal). HOME CARE INSTRUCTIONS   Follow wound care instructions as directed.  Keep the wound clean by washing it with soap and water.  Keep the wound moist with provided antibiotic cream or petroleum jelly until completely healed. Moisten twice a day for about 2 weeks.  Get stitches (sutures) taken out at the scheduled time.  Avoid touching or manipulating your wound unless needed. Wash your hands thoroughly before and after touching your wound.  Follow all restrictions such as limits on exercise or work. This depends on where your scar is located.  Keep the scar protected from sunburn. Cover the scar with sunscreen/sunblock with SPF 30 or higher.  Gently massage the scar using a circular motion to help minimize the appearance of the scar. Do this only after the wound has closed and all the sutures have been removed.  For hypertrophic or keloidal scars, there are several ways to treat and minimize their appearance. Methods include compression therapy, intralesional corticosteroids, laser therapy, or surgery. These methods are performed  by your caregiver. Remember that the scar may appear lighter or darker than your normal skin color. This difference in color should even out with time. SEEK MEDICAL CARE IF:   You have a fever.  You develop signs of infection such as pain, redness, pus, and warmth.  You have questions or concerns. Document Released: 01/09/2010 Document Revised: 10/14/2011 Document Reviewed: 01/09/2010 [facility] Patient Information 2015 [place], [place]. This information is not intended to replace advice given to you by your health care provider. Make sure you discuss any questions you have with your health care provider.

## 2015-02-06 NOTE — ED Provider Notes (Signed)
CSN: [id number]     Arrival date & time 02/06/15  0903 History   First MD Initiated Contact with Patient 02/06/15 [phone]     Chief Complaint  Patient presents with  . Suture / Staple Removal     (Consider location/radiation/quality/duration/timing/severity/associated sxs/prior Treatment) Patient is a 69 y.o. female presenting with suture removal. The history is provided by the patient.  Suture / Staple Removal This is a new problem. The current episode started more than 1 week ago. The problem occurs constantly. The problem has been gradually improving. Nothing aggravates the symptoms. Nothing relieves the symptoms.    Past Medical History  Diagnosis Date  . Chest pain     2D ECHO, 05/29/2011 -EF >55%, normal; MYOVIEW, 05/29/2011 - normal  . Diabetes   . Hyperlipidemia   . Hypertension   . Bilateral cataracts   . Glaucoma   . Fracture, foot june 2014    left  . Depression   . Neuropathy     legs  . PMB (postmenopausal bleeding)   . Adenomatous colon polyp 2014    repeat colon in 5 years  . Lichen sclerosus et atrophicus of the vulva 05/2014   Past Surgical History  Procedure Laterality Date  . Cholecystectomy  1969  . Cesarean section  1974  . Carpal tunnel release Bilateral   . Tubal ligation Bilateral   . Hysteroscopy  02-10-97    D&C, polyp--focal hyperplasia w/o atypia  . Endometrial biopsy  09-30-97    Dr Ricard Dillon   Family History  Problem Relation Age of Onset  . Cancer Mother     Lung cancer  . Stroke Father 69  . Heart disease Father   . Hypertension Father   . Cancer Brother     Liver cancer  . Cancer Maternal Grandmother     Colon cancer  . Colon cancer Maternal Grandmother 29  . Heart attack Maternal Grandfather   . Heart attack Paternal Grandmother   . Alzheimer's disease Paternal Grandfather   . Heart disease Brother 1   History  Substance Use Topics  . Smoking status: Never Smoker   . Smokeless tobacco: Never Used  . Alcohol Use: No    OB History    Gravida Para Term Preterm AB TAB SAB Ectopic Multiple Living   3 2 2  1     2      Review of Systems  Skin: Positive for wound.  All other systems reviewed and are negative.     Allergies  Erythromycin  Home Medications   Prior to Admission medications   Medication Sig Start Date End Date Taking? Authorizing Provider  ACCU-CHEK AVIVA PLUS test strip  03/03/13   Historical Provider, MD  ALPRAZolam Duanne Moron) 0.25 MG tablet Take 0.25 mg by mouth every 8 (eight) hours.    Historical Provider, MD  amLODipine (NORVASC) 5 MG tablet TAKE 1 TABLET BY MOUTH DAILY 04/12/14   Pixie Casino, MD  clobetasol ointment (TEMOVATE) 5.03 % Apply 1 application topically 2 (two) times a week. Use to control symptoms twice a week. 09/14/14   Seba Dalkai, MD  clobetasol ointment (TEMOVATE) 5.46 % APPLY 1 APPLICATION TOPICALLY 2 (TWO) TIMES DAILY. 01/16/15   Megan Salon, MD  DULoxetine (CYMBALTA) 30 MG capsule Take 30 mg by mouth daily.     Historical Provider, MD  HUMALOG 100 UNIT/ML injection  03/15/13   Historical Provider, MD  HYDROcodone-acetaminophen (NORCO/VICODIN) 5-325 MG per tablet Take 1 tablet by  mouth as needed. 04/20/14   Historical Provider, MD  Insulin Human (INSULIN PUMP) 100 unit/ml SOLN Inject into the skin.    Historical Provider, MD  latanoprost (XALATAN) 0.005 % ophthalmic solution Place 1 drop into both eyes at bedtime.    Historical Provider, MD  lisinopril (PRINIVIL,ZESTRIL) 40 MG tablet Take 40 mg by mouth daily.    Historical Provider, MD  Multiple Vitamin (MULTIVITAMIN) tablet Take 1 tablet by mouth daily.    Historical Provider, MD  rosuvastatin (CRESTOR) 10 MG tablet Take 10 mg by mouth at bedtime.    Historical Provider, MD  traZODone (DESYREL) 50 MG tablet Take 50 mg by mouth at bedtime.    Historical Provider, MD   BP 140/50 mmHg  Pulse 71  Temp(Src) 98.3 F (36.8 C) (Oral)  Resp 18  SpO2 98%  LMP 08/06/1999 Physical Exam  Constitutional: She  is oriented to person, place, and time. She appears well-developed and well-nourished. No distress.  HENT:  Head: Normocephalic and atraumatic.  Right Ear: Hearing normal.  Left Ear: Hearing normal.  Nose: Nose normal.  Mouth/Throat: Oropharynx is clear and moist and mucous membranes are normal.  Eyes: Conjunctivae and EOM are normal. Pupils are equal, round, and reactive to light.  Neck: Normal range of motion. Neck supple.  Cardiovascular: Regular rhythm, S1 normal and S2 normal.  Exam reveals no gallop and no friction rub.   No murmur heard. Pulmonary/Chest: Effort normal and breath sounds normal. No respiratory distress. She exhibits no tenderness.  Abdominal: Soft. Normal appearance and bowel sounds are normal. There is no hepatosplenomegaly. There is no tenderness. There is no rebound, no guarding, no tenderness at McBurney's point and negative Murphy's sign. No hernia.  Musculoskeletal: Normal range of motion.  Neurological: She is alert and oriented to person, place, and time. She has normal strength. No cranial nerve deficit or sensory deficit. Coordination normal. GCS eye subscore is 4. GCS verbal subscore is 5. GCS motor subscore is 6.  Skin: Skin is warm, dry and intact. No rash noted. No cyanosis.  Well-healing horizontal laceration across left side of forehead with sutures intact. No erythema, drainage, induration  Psychiatric: She has a normal mood and affect. Her speech is normal and behavior is normal. Thought content normal.  Nursing note and vitals reviewed.   ED Course  Procedures (including critical care time) Labs Review Labs Reviewed - No data to display  Imaging Review No results found.   EKG Interpretation None      MDM   Final diagnoses:  Visit for suture removal    8 sutures removed.    Orpah Greek, MD 02/06/15 [phone]

## 2015-02-06 NOTE — ED Notes (Signed)
Pt in requesting removal of sutures that were placed x 9 days ago. Site is clean, dry, no erythema or drainage noted.

## 2015-03-14 ENCOUNTER — Telehealth: Payer: Self-pay | Admitting: Internal Medicine

## 2015-03-14 ENCOUNTER — Encounter: Payer: Self-pay | Admitting: Internal Medicine

## 2015-03-17 NOTE — Telephone Encounter (Signed)
Close encounter 

## 2015-03-28 ENCOUNTER — Ambulatory Visit: Payer: Medicare Other | Admitting: Internal Medicine

## 2015-04-11 ENCOUNTER — Other Ambulatory Visit: Payer: Self-pay | Admitting: Internal Medicine

## 2015-05-04 ENCOUNTER — Encounter: Payer: Self-pay | Admitting: Internal Medicine

## 2015-05-04 ENCOUNTER — Ambulatory Visit (INDEPENDENT_AMBULATORY_CARE_PROVIDER_SITE_OTHER): Payer: Medicare Other | Admitting: Internal Medicine

## 2015-05-04 VITALS — BP 124/64 | HR 66 | Ht 62.0 in | Wt 158.7 lb

## 2015-05-04 DIAGNOSIS — R002 Palpitations: Secondary | ICD-10-CM | POA: Diagnosis not present

## 2015-05-04 DIAGNOSIS — I700 Atherosclerosis of aorta: Secondary | ICD-10-CM | POA: Diagnosis not present

## 2015-05-04 DIAGNOSIS — R011 Cardiac murmur, unspecified: Secondary | ICD-10-CM | POA: Diagnosis not present

## 2015-05-04 DIAGNOSIS — I340 Nonrheumatic mitral (valve) insufficiency: Secondary | ICD-10-CM

## 2015-05-04 DIAGNOSIS — I071 Rheumatic tricuspid insufficiency: Secondary | ICD-10-CM

## 2015-05-04 DIAGNOSIS — IMO0001 Reserved for inherently not codable concepts without codable children: Secondary | ICD-10-CM

## 2015-05-04 NOTE — Patient Instructions (Addendum)
Your physician has requested that you have an echocardiogram @ [address]. Echocardiography is a painless test that uses sound waves to create images of your heart. It provides your doctor with information about the size and shape of your heart and how well your heart's chambers and valves are working. This procedure takes approximately one hour. There are no restrictions for this procedure.  Your physician wants you to follow-up in: 1 year with Dr. Hilty. You will receive a reminder letter in the mail two months in advance. If you don't receive a letter, please call our office to schedule the follow-up appointment.  

## 2015-05-07 DIAGNOSIS — R011 Cardiac murmur, unspecified: Secondary | ICD-10-CM | POA: Insufficient documentation

## 2015-05-07 DIAGNOSIS — IMO0001 Reserved for inherently not codable concepts without codable children: Secondary | ICD-10-CM | POA: Insufficient documentation

## 2015-05-07 NOTE — Progress Notes (Signed)
OFFICE NOTE  Chief Complaint:  Routine follow-up  Primary Care Physician: Geoffery Lyons, MD  HPI:  Kelsey Pierce  is a 69 year old female with a history of epigastric pain, a systolic murmur, which on echo there was only trace amount of MR and mild TR. She also had mild concentric LVH and left atrial enlargement with a normal LV systolic function. There was not  impaired diastolic dysfunction. I have talked about some of the signs of hypertensive heart disease and felt that we should treat her more aggressively for her blood pressure and recommended starting lisinopril 20 mg daily. She has since followed up with Altha Harm, our clinical pharmacist, who had added 5 mg of amlodipine for blood pressures that remained elevated. I did review a two month blood pressure chart from her, which shows very good control of her blood pressure between 619 to 509 systolic. Heart rate has ranged basically in the 60s and low 70s.   Kelsey Pierce returns today for followup. Symptomatically, she is doing very well. Denies any chest pain, worsening shortness of breath, or any associated problems. She is overdue for an echocardiogram to evaluate for change in her valvular heart disease.  PMHx:  Past Medical History  Diagnosis Date  . Chest pain     2D ECHO, 05/29/2011 -EF >55%, normal; MYOVIEW, 05/29/2011 - normal  . Diabetes   . Hyperlipidemia   . Hypertension   . Bilateral cataracts   . Glaucoma   . Fracture, foot june 2014    left  . Depression   . Neuropathy     legs  . PMB (postmenopausal bleeding)   . Adenomatous colon polyp 2014    repeat colon in 5 years  . Lichen sclerosus et atrophicus of the vulva 05/2014    Past Surgical History  Procedure Laterality Date  . Cholecystectomy  1969  . Cesarean section  1974  . Carpal tunnel release Bilateral   . Tubal ligation Bilateral   . Hysteroscopy  02-10-97    D&C, polyp--focal hyperplasia w/o atypia  . Endometrial biopsy   09-30-97    Dr Ricard Dillon    FAMHx:  Family History  Problem Relation Age of Onset  . Cancer Mother     Lung cancer  . Stroke Father 3  . Heart disease Father   . Hypertension Father   . Cancer Brother     Liver cancer  . Cancer Maternal Grandmother     Colon cancer  . Colon cancer Maternal Grandmother 48  . Heart attack Maternal Grandfather   . Heart attack Paternal Grandmother   . Alzheimer's disease Paternal Grandfather   . Heart disease Brother 54    SOCHx:   reports that she has never smoked. She has never used smokeless tobacco. She reports that she does not drink alcohol or use illicit drugs.  ALLERGIES:  Allergies  Allergen Reactions  . Erythromycin     Abdominal pain    ROS: A comprehensive review of systems was negative.  HOME MEDS: Current Outpatient Prescriptions  Medication Sig Dispense Refill  . ACCU-CHEK AVIVA PLUS test strip     . ALPRAZolam (XANAX) 0.25 MG tablet Take 0.25 mg by mouth every 8 (eight) hours.    Marland Kitchen amLODipine (NORVASC) 5 MG tablet TAKE 1 TABLET BY MOUTH DAILY 30 tablet 0  . clobetasol ointment (TEMOVATE) 3.26 % Apply 1 application topically 2 (two) times a week. Use to control symptoms twice a week. 30 g 1  . clobetasol ointment (  TEMOVATE) 3.71 % APPLY 1 APPLICATION TOPICALLY 2 (TWO) TIMES DAILY. 30 g 0  . DULoxetine (CYMBALTA) 30 MG capsule Take 30 mg by mouth daily.     Marland Kitchen HUMALOG 100 UNIT/ML injection     . HYDROcodone-acetaminophen (NORCO/VICODIN) 5-325 MG per tablet Take 1 tablet by mouth as needed.    . Insulin Human (INSULIN PUMP) 100 unit/ml SOLN Inject into the skin.    Marland Kitchen latanoprost (XALATAN) 0.005 % ophthalmic solution Place 1 drop into both eyes at bedtime.    Marland Kitchen lisinopril (PRINIVIL,ZESTRIL) 40 MG tablet Take 40 mg by mouth daily.    . Multiple Vitamin (MULTIVITAMIN) tablet Take 1 tablet by mouth daily.    . rosuvastatin (CRESTOR) 10 MG tablet Take 10 mg by mouth at bedtime.    . traZODone (DESYREL) 50 MG tablet  Take 50 mg by mouth at bedtime.     No current facility-administered medications for this visit.    LABS/IMAGING: No results found for this or any previous visit (from the past 48 hour(s)). No results found.  VITALS: BP 124/64 mmHg  Pulse 66  Ht 5\' 2"  (1.575 m)  Wt 158 lb 11.2 oz (71.986 kg)  BMI 29.02 kg/m2  LMP 08/06/1999  EXAM: General appearance: alert and no distress Neck: no adenopathy, no carotid bruit, no JVD, supple, symmetrical, trachea midline and thyroid not enlarged, symmetric, no tenderness/mass/nodules Lungs: clear to auscultation bilaterally Heart: regular rate and rhythm, S1, S2 normal 0-6/2 systolic murmur at the RUSB, 2/6 murmur at the apex Abdomen: soft, non-tender; bowel sounds normal; no masses,  no organomegaly Extremities: extremities normal, atraumatic, no cyanosis or edema Pulses: 2+ and symmetric Skin: Skin color, texture, turgor normal. No rashes or lesions Neurologic: Grossly normal  EKG: Normal sinus rhythm at 66  ASSESSMENT: 1. Hypertension-controlled 2. Anxiety 3. Palpitations - resolved 4. Insulin-dependent diabetes 5. Depression 6. Murmur (aortic sclerosis, MR, TR)  PLAN: 1.   Overall [name] appears to be doing very well.  Her murmur may be somewhat louder. Blood pressure is well controlled. Her palpitations have resolved. Her cholesterol has been at goal. I will plan to recheck a 2D echo. Barring anything significantly different, I will plan to see her back annually or sooner.  Pixie Casino, MD, [facility] Attending Cardiologist [place] [facility] [place] 05/07/2015, 9:46 PM

## 2015-05-10 ENCOUNTER — Encounter: Payer: Self-pay | Admitting: Obstetrics and Gynecology

## 2015-05-10 ENCOUNTER — Ambulatory Visit (INDEPENDENT_AMBULATORY_CARE_PROVIDER_SITE_OTHER): Payer: Medicare Other | Admitting: Obstetrics and Gynecology

## 2015-05-10 VITALS — BP 142/76 | HR 84 | Resp 18 | Ht 63.0 in | Wt 162.4 lb

## 2015-05-10 DIAGNOSIS — Z01419 Encounter for gynecological examination (general) (routine) without abnormal findings: Secondary | ICD-10-CM | POA: Diagnosis not present

## 2015-05-10 MED ORDER — CLOBETASOL PROPIONATE 0.05 % EX OINT: TOPICAL_OINTMENT | CUTANEOUS | Status: DC

## 2015-05-10 NOTE — Progress Notes (Signed)
Patient ID: Kelsey Pierce, female   DOB: 04-Oct-1945, 69 y.o.   MRN: [id number] 69 y.o. G75P2012 Married Caucasian female here for annual exam.    Lichen sclerosus of the vulva.  Uses Clobetasol ointment occasionally.   Using this twice a week, and this control symptoms.  Does ok if uses less often.  Does not need refill.   Hgb A1C 6.8 with last check.   Likes to walk the dog.   PCP:  Kelsey Bunting, MD   Patient's last menstrual period was 08/06/1999.          Sexually active: No. female partner The current method of family planning is post menopausal status.    Exercising: Yes.    walks 3x/week. Smoker:  no  Health Maintenance: Pap:  03-31-09 Neg History of abnormal Pap:  no MMG: 12-02-14 normal per patient:Kelsey Pierce Colonoscopy:  03-25-13 polyp with Dr. Scarlette Pierce.  Next due 03/2018. BMD:   2013 Result  Normal wth Dr. Reynaldo Pierce TDaP:  01/2015 Screening Labs:  Hb today: PCP, Urine today: PCP   reports that she has never smoked. She has never used smokeless tobacco. She reports that she does not drink alcohol or use illicit drugs.  Past Medical History  Diagnosis Date  . Chest pain     2D ECHO, 05/29/2011 -EF >55%, normal; MYOVIEW, 05/29/2011 - normal  . Diabetes ([place])   . Hyperlipidemia   . Hypertension   . Bilateral cataracts   . Glaucoma   . Fracture, foot june 2014    left  . Depression   . Neuropathy ([facility])     legs  . PMB (postmenopausal bleeding)   . Adenomatous colon polyp 2014    repeat colon in 5 years  . Lichen sclerosus et atrophicus of the vulva 05/2014    Past Surgical History  Procedure Laterality Date  . Cholecystectomy  1969  . Cesarean section  1974  . Carpal tunnel release Bilateral   . Tubal ligation Bilateral   . Hysteroscopy  02-10-97    D&C, polyp--focal hyperplasia w/o atypia  . Endometrial biopsy  09-30-97    Dr Kelsey Pierce    Current Outpatient Prescriptions  Medication Sig Dispense Refill  . ACCU-CHEK AVIVA PLUS test strip     .  ALPRAZolam (XANAX) 0.25 MG tablet Take 0.25 mg by mouth every 8 (eight) hours.    Marland Kitchen amLODipine (NORVASC) 5 MG tablet TAKE 1 TABLET BY MOUTH DAILY 30 tablet 0  . clobetasol ointment (TEMOVATE) 3.24 % Apply 1 application topically 2 (two) times a week. Use to control symptoms twice a week. 30 g 1  . clobetasol ointment (TEMOVATE) 4.01 % APPLY 1 APPLICATION TOPICALLY 2 (TWO) TIMES DAILY. 30 g 0  . DULoxetine (CYMBALTA) 30 MG capsule Take 30 mg by mouth daily.     Marland Kitchen HUMALOG 100 UNIT/ML injection     . HYDROcodone-acetaminophen (NORCO/VICODIN) 5-325 MG per tablet Take 1 tablet by mouth as needed.    . Insulin Human (INSULIN PUMP) 100 unit/ml SOLN Inject into the skin.    Marland Kitchen latanoprost (XALATAN) 0.005 % ophthalmic solution Place 1 drop into both eyes at bedtime.    Marland Kitchen lisinopril (PRINIVIL,ZESTRIL) 40 MG tablet Take 40 mg by mouth daily.    . Multiple Vitamin (MULTIVITAMIN) tablet Take 1 tablet by mouth daily.    . rosuvastatin (CRESTOR) 10 MG tablet Take 10 mg by mouth at bedtime.    . traZODone (DESYREL) 50 MG tablet Take 50 mg by mouth at bedtime.  No current facility-administered medications for this visit.    Family History  Problem Relation Age of Onset  . Cancer Mother     Lung cancer  . Stroke Father 63  . Heart disease Father   . Hypertension Father   . Cancer Brother     Liver cancer  . Cancer Maternal Grandmother     Colon cancer  . Colon cancer Maternal Grandmother 48  . Heart attack Maternal Grandfather   . Heart attack Paternal Grandmother   . Alzheimer's disease Paternal Grandfather   . Heart disease Brother 60    ROS:  Pertinent items are noted in HPI.  Otherwise, a comprehensive ROS was negative.  Exam:   BP 142/76 mmHg  Pulse 84  Resp 18  Ht 5\' 3"  (1.6 m)  Wt 162 lb 6.4 oz (73.664 kg)  BMI 28.78 kg/m2  LMP 08/06/1999    General appearance: alert, cooperative and appears stated age Head: Normocephalic, without obvious abnormality, atraumatic Neck: no  adenopathy, supple, symmetrical, trachea midline and thyroid normal to inspection and palpation Lungs: clear to auscultation bilaterally Breasts: normal appearance, no masses or tenderness, Inspection negative, No nipple retraction or dimpling, No nipple discharge or bleeding, No axillary or supraclavicular adenopathy Heart: regular rate and rhythm Abdomen: soft, non-tender; bowel sounds normal; no masses,  no organomegaly Extremities: extremities normal, atraumatic, no cyanosis or edema Skin: Skin color, texture, turgor normal. No rashes or lesions Lymph nodes: Cervical, supraclavicular, and axillary nodes normal. No abnormal inguinal nodes palpated Neurologic: Grossly normal  Pelvic: External genitalia:   Prominent clitoris (old change).  Bilateral labia with peach coloration to epidermis.               Urethra:  normal appearing urethra with no masses, tenderness or lesions              Bartholins and Skenes: normal                 Vagina: normal appearing vagina with normal color and discharge, no lesions              Cervix: no lesions              Pap taken: Yes.   Bimanual Exam:  Uterus:  normal size, contour, position, consistency, mobility, non-tender              Adnexa: normal adnexa and no mass, fullness, tenderness              Rectovaginal: Yes.  .  Confirms.              Anus:  normal sphincter tone, no lesions  Chaperone was present for exam.  Assessment:   Well woman visit with normal exam. Lichen sclerosis controlled with Clobetasol ointment.   Plan: Yearly mammogram recommended after age 51.  Kelsey Pierce called to get a copy of her mammogram from this year.  Scan did not show report.  Recommended self breast exam.  Pap and HR HPV as above. Discussed Calcium, Vitamin D, regular exercise program including cardiovascular and weight bearing exercise. Labs performed.  No..   See orders. Refills given on medications.  Yes.  .  See orders.  Clobetasol ointment 0.05%, once  weekly to vulva prn.   Dispense 1, RF one.  Follow up annually and prn.      After visit summary provided.

## 2015-05-10 NOTE — Patient Instructions (Signed)

## 2015-05-11 LAB — IPS PAP SMEAR ONLY

## 2015-05-15 ENCOUNTER — Other Ambulatory Visit: Payer: Self-pay | Admitting: Internal Medicine

## 2015-05-16 ENCOUNTER — Other Ambulatory Visit (HOSPITAL_COMMUNITY): Payer: Medicare Other

## 2015-05-16 NOTE — Telephone Encounter (Signed)
Rx request sent to pharmacy.  

## 2015-05-18 ENCOUNTER — Ambulatory Visit (HOSPITAL_COMMUNITY): Payer: Medicare Other | Attending: Cardiology

## 2015-05-18 ENCOUNTER — Other Ambulatory Visit: Payer: Self-pay

## 2015-05-18 DIAGNOSIS — I359 Nonrheumatic aortic valve disorder, unspecified: Secondary | ICD-10-CM | POA: Diagnosis present

## 2015-05-18 DIAGNOSIS — I700 Atherosclerosis of aorta: Secondary | ICD-10-CM

## 2015-05-18 DIAGNOSIS — E119 Type 2 diabetes mellitus without complications: Secondary | ICD-10-CM | POA: Diagnosis not present

## 2015-05-18 DIAGNOSIS — E785 Hyperlipidemia, unspecified: Secondary | ICD-10-CM | POA: Diagnosis not present

## 2015-05-18 DIAGNOSIS — R011 Cardiac murmur, unspecified: Secondary | ICD-10-CM | POA: Diagnosis not present

## 2015-05-18 DIAGNOSIS — Z8249 Family history of ischemic heart disease and other diseases of the circulatory system: Secondary | ICD-10-CM | POA: Insufficient documentation

## 2015-05-18 DIAGNOSIS — IMO0001 Reserved for inherently not codable concepts without codable children: Secondary | ICD-10-CM

## 2015-05-18 DIAGNOSIS — I10 Essential (primary) hypertension: Secondary | ICD-10-CM | POA: Insufficient documentation

## 2015-05-18 DIAGNOSIS — I350 Nonrheumatic aortic (valve) stenosis: Secondary | ICD-10-CM | POA: Diagnosis not present

## 2015-05-26 ENCOUNTER — Other Ambulatory Visit: Payer: Self-pay | Admitting: *Deleted

## 2015-05-26 DIAGNOSIS — R931 Abnormal findings on diagnostic imaging of heart and coronary circulation: Secondary | ICD-10-CM

## 2015-05-30 ENCOUNTER — Encounter: Payer: Self-pay | Admitting: Cardiology

## 2015-05-30 ENCOUNTER — Telehealth: Payer: Self-pay | Admitting: *Deleted

## 2015-05-30 DIAGNOSIS — Z79899 Other long term (current) drug therapy: Secondary | ICD-10-CM

## 2015-05-30 NOTE — Telephone Encounter (Signed)
Kelsey Pierce called - pt scheduled for CT on 10/27 but needs lab order for BMET d/t age criteria.  Ordered BMET - Kelsey Pierce to inform pt to come to [facility] for draw.

## 2015-05-31 ENCOUNTER — Other Ambulatory Visit (INDEPENDENT_AMBULATORY_CARE_PROVIDER_SITE_OTHER): Payer: Medicare Other | Admitting: *Deleted

## 2015-05-31 DIAGNOSIS — I517 Cardiomegaly: Secondary | ICD-10-CM

## 2015-05-31 DIAGNOSIS — I071 Rheumatic tricuspid insufficiency: Secondary | ICD-10-CM

## 2015-05-31 DIAGNOSIS — IMO0001 Reserved for inherently not codable concepts without codable children: Secondary | ICD-10-CM

## 2015-05-31 DIAGNOSIS — I340 Nonrheumatic mitral (valve) insufficiency: Secondary | ICD-10-CM

## 2015-05-31 DIAGNOSIS — E785 Hyperlipidemia, unspecified: Secondary | ICD-10-CM

## 2015-05-31 LAB — BASIC METABOLIC PANEL
BUN: 15 mg/dL (ref 7–25)
CO2: 27 mmol/L (ref 20–31)
CREATININE: 0.81 mg/dL (ref 0.60–0.93)
Calcium: 10.4 mg/dL (ref 8.6–10.4)
Chloride: 103 mmol/L (ref 98–110)
Glucose, Bld: 57 mg/dL — ABNORMAL LOW (ref 65–99)
Potassium: 5.3 mmol/L (ref 3.5–5.3)
Sodium: 139 mmol/L (ref 135–146)

## 2015-06-01 ENCOUNTER — Other Ambulatory Visit: Payer: Self-pay | Admitting: Internal Medicine

## 2015-06-01 ENCOUNTER — Telehealth: Payer: Self-pay | Admitting: Internal Medicine

## 2015-06-01 ENCOUNTER — Ambulatory Visit (HOSPITAL_COMMUNITY)
Admission: RE | Admit: 2015-06-01 | Discharge: 2015-06-01 | Disposition: A | Discharge status: Home / Self Care | Payer: Medicare Other | Source: Ambulatory Visit | Attending: Internal Medicine | Admitting: Internal Medicine

## 2015-06-01 DIAGNOSIS — R931 Abnormal findings on diagnostic imaging of heart and coronary circulation: Secondary | ICD-10-CM | POA: Diagnosis not present

## 2015-06-01 DIAGNOSIS — I340 Nonrheumatic mitral (valve) insufficiency: Secondary | ICD-10-CM

## 2015-06-01 NOTE — Telephone Encounter (Signed)
Kelsey Pierce id returning call about her lab results   Thanks

## 2015-06-01 NOTE — Telephone Encounter (Signed)
Results given to patient

## 2015-06-05 ENCOUNTER — Ambulatory Visit (HOSPITAL_COMMUNITY): Admission: RE | Admit: 2015-06-05 | Payer: Medicare Other | Source: Ambulatory Visit

## 2015-06-06 HISTORY — PX: CATARACT EXTRACTION W/ INTRAOCULAR LENS IMPLANT: SHX1309

## 2015-06-19 ENCOUNTER — Ambulatory Visit (HOSPITAL_COMMUNITY)
Admission: RE | Admit: 2015-06-19 | Discharge: 2015-06-19 | Disposition: A | Discharge status: Home / Self Care | Payer: Medicare Other | Source: Ambulatory Visit | Attending: Internal Medicine | Admitting: Internal Medicine

## 2015-06-19 ENCOUNTER — Other Ambulatory Visit: Payer: Self-pay | Admitting: Internal Medicine

## 2015-06-19 DIAGNOSIS — I998 Other disorder of circulatory system: Secondary | ICD-10-CM | POA: Diagnosis not present

## 2015-06-19 DIAGNOSIS — I340 Nonrheumatic mitral (valve) insufficiency: Secondary | ICD-10-CM

## 2015-06-19 DIAGNOSIS — R931 Abnormal findings on diagnostic imaging of heart and coronary circulation: Secondary | ICD-10-CM | POA: Diagnosis not present

## 2015-06-19 MED ORDER — NITROGLYCERIN 0.4 MG SL SUBL
0.8000 mg | SUBLINGUAL_TABLET | Freq: Once | SUBLINGUAL | Status: DC
  Filled 2015-06-19: qty 25

## 2015-06-19 MED ORDER — NITROGLYCERIN 0.4 MG SL SUBL
0.4000 mg | SUBLINGUAL_TABLET | Freq: Once | SUBLINGUAL | Status: AC
  Administered 2015-06-19: 0.4 mg via SUBLINGUAL
  Filled 2015-06-19: qty 25

## 2015-06-19 MED ORDER — METOPROLOL TARTRATE 1 MG/ML IV SOLN
INTRAVENOUS | Status: AC
  Filled 2015-06-19: qty 5

## 2015-06-19 MED ORDER — IOHEXOL 350 MG/ML SOLN
100.0000 mL | Freq: Once | INTRAVENOUS | Status: AC | PRN
  Administered 2015-06-19: 80 mL via INTRAVENOUS

## 2015-06-19 MED ORDER — NITROGLYCERIN 0.4 MG SL SUBL
SUBLINGUAL_TABLET | SUBLINGUAL | Status: AC
  Filled 2015-06-19: qty 2

## 2015-06-19 MED ORDER — LIDOCAINE HCL 1 % IJ SOLN
INTRAMUSCULAR | Status: AC
  Filled 2015-06-19: qty 20

## 2015-06-19 MED ORDER — METOPROLOL TARTRATE 1 MG/ML IV SOLN
INTRAVENOUS | Status: AC
  Filled 2015-06-19: qty 10

## 2015-06-19 MED ORDER — METOPROLOL TARTRATE 1 MG/ML IV SOLN
5.0000 mg | INTRAVENOUS | Status: DC | PRN
  Administered 2015-06-19 (×3): 5 mg via INTRAVENOUS
  Filled 2015-06-19 (×4): qty 5

## 2015-06-22 ENCOUNTER — Telehealth: Payer: Self-pay | Admitting: Internal Medicine

## 2015-06-22 DIAGNOSIS — E785 Hyperlipidemia, unspecified: Secondary | ICD-10-CM

## 2015-06-22 NOTE — Telephone Encounter (Signed)
Spoke with pt, aware of CT results. Lab orders placed and pt aware to come by and have drawn.

## 2015-06-22 NOTE — Telephone Encounter (Signed)
Pt had a Ct Scan on Monday at [facility] to know if the results are back?

## 2015-11-21 ENCOUNTER — Other Ambulatory Visit: Payer: Self-pay | Admitting: Obstetrics & Gynecology

## 2015-11-21 NOTE — Telephone Encounter (Signed)
Medication refill request: clobetasol ointment Last AEX:  05-10-15 Next AEX: 06-05-16 Last MMG (if hormonal medication request): 12-02-14 WNL Refill authorized: please advise

## 2016-04-04 ENCOUNTER — Other Ambulatory Visit: Payer: Self-pay | Admitting: Internal Medicine

## 2016-04-04 MED ORDER — AMLODIPINE BESYLATE 5 MG PO TABS: 5.0000 mg | ORAL_TABLET | Freq: Every day | ORAL | 0 refills | Status: DC

## 2016-05-20 ENCOUNTER — Encounter: Payer: Self-pay | Admitting: Internal Medicine

## 2016-05-20 ENCOUNTER — Ambulatory Visit (INDEPENDENT_AMBULATORY_CARE_PROVIDER_SITE_OTHER): Payer: Medicare Other | Admitting: Internal Medicine

## 2016-05-20 VITALS — BP 158/69 | HR 82 | Ht 62.0 in | Wt 168.0 lb

## 2016-05-20 DIAGNOSIS — I350 Nonrheumatic aortic (valve) stenosis: Secondary | ICD-10-CM | POA: Diagnosis not present

## 2016-05-20 DIAGNOSIS — R5383 Other fatigue: Secondary | ICD-10-CM | POA: Diagnosis not present

## 2016-05-20 DIAGNOSIS — R0602 Shortness of breath: Secondary | ICD-10-CM

## 2016-05-20 DIAGNOSIS — I25119 Atherosclerotic heart disease of native coronary artery with unspecified angina pectoris: Secondary | ICD-10-CM

## 2016-05-20 DIAGNOSIS — I2510 Atherosclerotic heart disease of native coronary artery without angina pectoris: Secondary | ICD-10-CM | POA: Insufficient documentation

## 2016-05-20 LAB — CBC
HEMATOCRIT: 39.8 % (ref 35.0–45.0)
HEMOGLOBIN: 12.7 g/dL (ref 11.7–15.5)
MCH: 28.9 pg (ref 27.0–33.0)
MCHC: 31.9 g/dL — ABNORMAL LOW (ref 32.0–36.0)
MCV: 90.5 fL (ref 80.0–100.0)
MPV: 8.5 fL (ref 7.5–12.5)
Platelets: 322 10*3/uL (ref 140–400)
RBC: 4.4 MIL/uL (ref 3.80–5.10)
RDW: 13.5 % (ref 11.0–15.0)
WBC: 7.8 10*3/uL (ref 3.8–10.8)

## 2016-05-20 LAB — TSH: TSH: 3.83 m[IU]/L

## 2016-05-20 LAB — VITAMIN B12: Vitamin B-12: 875 pg/mL (ref 200–1100)

## 2016-05-20 NOTE — Progress Notes (Signed)
OFFICE NOTE  Chief Complaint:  Fatigue, dyspnea  Primary Care Physician: Geoffery Lyons, MD  HPI:  Kelsey Pierce  is a 70 year old female with a history of epigastric pain, a systolic murmur, which on echo there was only trace amount of MR and mild TR. She also had mild concentric LVH and left atrial enlargement with a normal LV systolic function. There was not  impaired diastolic dysfunction. I have talked about some of the signs of hypertensive heart disease and felt that we should treat her more aggressively for her blood pressure and recommended starting lisinopril 20 mg daily. She has since followed up with Altha Harm, our clinical pharmacist, who had added 5 mg of amlodipine for blood pressures that remained elevated. I did review a two month blood pressure chart from her, which shows very good control of her blood pressure between [id number] to [id number] systolic. Heart rate has ranged basically in the 60s and low 70s.   Kelsey Pierce returns today for followup. Symptomatically, she is doing very well. Denies any chest pain, worsening shortness of breath, or any associated problems. She is overdue for an echocardiogram to evaluate for change in her valvular heart disease.  05/20/2016  Kelsey Pierce was seen today for follow-up. At her last office visit I repeated an echocardiogram as she had a louder systolic murmur. LVEF was 65-70% with grade 1 diastolic dysfunction and very mild aortic stenosis with a mean gradient of 8 mmHg. There was a questionable mobile density in the right atrium. A CT angiogram was recommended. This was performed on 06/19/2015. No mobile density was noted, however she was found to have coronary artery disease. Specifically her coronary artery calcium score was 678. There was moderate coronary disease in the proximal LAD and RCA as well as mild concentric LVH. She was asymptomatic the time and no further testing was undertaken however she reports over the past  several months she's become progressively more fatigue, particularly with exertion. She also has less energy overall and does get short of breath doing things like raking leaves. She denies any chest pain. She reports poor sleep at night however this is a chronic problem.  PMHx:  Past Medical History:  Diagnosis Date  . Adenomatous colon polyp 2014   repeat colon in 5 years  . Bilateral cataracts   . Chest pain    2D ECHO, 05/29/2011 -EF >55%, normal; MYOVIEW, 05/29/2011 - normal  . Depression   . Diabetes ([place])   . Fracture, foot june 2014   left  . Glaucoma   . Hyperlipidemia   . Hypertension   . Lichen sclerosus et atrophicus of the vulva 05/2014  . Neuropathy ([facility])    legs  . PMB (postmenopausal bleeding)     Past Surgical History:  Procedure Laterality Date  . CARPAL TUNNEL RELEASE Bilateral   . CESAREAN SECTION  1974  . CHOLECYSTECTOMY  1969  . ENDOMETRIAL BIOPSY  09-30-97   Dr Ricard Dillon  . HYSTEROSCOPY  02-10-97   D&C, polyp--focal hyperplasia w/o atypia  . TUBAL LIGATION Bilateral     FAMHx:  Family History  Problem Relation Age of Onset  . Cancer Mother     Lung cancer  . Stroke Father 42  . Heart disease Father   . Hypertension Father   . Cancer Brother     Liver cancer  . Cancer Maternal Grandmother     Colon cancer  . Colon cancer Maternal Grandmother 24  . Heart attack Maternal Grandfather   .  Heart attack Paternal Grandmother   . Alzheimer's disease Paternal Grandfather   . Heart disease Brother 49    SOCHx:   reports that she has never smoked. She has never used smokeless tobacco. She reports that she does not drink alcohol or use drugs.  ALLERGIES:  Allergies  Allergen Reactions  . Erythromycin     Abdominal pain    ROS: A comprehensive review of systems was negative.  HOME MEDS: Current Outpatient Prescriptions  Medication Sig Dispense Refill  . ACCU-CHEK AVIVA PLUS test strip     . ALPRAZolam (XANAX) 0.25 MG tablet Take  0.25 mg by mouth every 8 (eight) hours.    Marland Kitchen amLODipine (NORVASC) 5 MG tablet Take 1 tablet (5 mg total) by mouth daily. 90 tablet 0  . clobetasol ointment (TEMOVATE) 0.05 % Apply topically 2 (two) times daily. Use twice weekly to control lichen sclerosus symptoms. 30 g 0  . DULoxetine (CYMBALTA) 30 MG capsule Take 30 mg by mouth daily.     Marland Kitchen HUMALOG 100 UNIT/ML injection     . HYDROcodone-acetaminophen (NORCO/VICODIN) 5-325 MG per tablet Take 1 tablet by mouth as needed.    . Insulin Human (INSULIN PUMP) 100 unit/ml SOLN Inject into the skin.    Marland Kitchen latanoprost (XALATAN) 0.005 % ophthalmic solution Place 1 drop into both eyes at bedtime.    Marland Kitchen lisinopril (PRINIVIL,ZESTRIL) 40 MG tablet Take 40 mg by mouth daily.    . Multiple Vitamin (MULTIVITAMIN) tablet Take 1 tablet by mouth daily.    . rosuvastatin (CRESTOR) 10 MG tablet Take 10 mg by mouth at bedtime.    . traZODone (DESYREL) 50 MG tablet Take 50 mg by mouth at bedtime.     No current facility-administered medications for this visit.     LABS/IMAGING: No results found for this or any previous visit (from the past 48 hour(s)). No results found.  VITALS: BP (!) 158/69   Pulse 82   Ht 5\' 2"  (1.575 m)   Wt 168 lb (76.2 kg)   LMP 08/06/1999   BMI 30.73 kg/m   EXAM: General appearance: alert and no distress Neck: no adenopathy, no carotid bruit, no JVD, supple, symmetrical, trachea midline and thyroid not enlarged, symmetric, no tenderness/mass/nodules Lungs: clear to auscultation bilaterally Heart: regular rate and rhythm, S1, S2 normal [id number] systolic murmur at the RUSB, 2/6 murmur at the apex Abdomen: soft, non-tender; bowel sounds normal; no masses,  no organomegaly Extremities: extremities normal, atraumatic, no cyanosis or edema Pulses: 2+ and symmetric Skin: Skin color, texture, turgor normal. No rashes or lesions Neurologic: Grossly normal  EKG: Normal sinus rhythm at 80  ASSESSMENT: 1. Progressive fatigue and  dyspnea 2. Coronary artery disease with moderate proximal LAD and RCA stenosis and a coronary calcium score of 678 (06/2015) - by CT coronary angiography 3. Hypertension-uncontrolled 4. Anxiety  5. Palpitations - resolved 6. Insulin-dependent diabetes 7. Depression 8. Murmur (mild aortic stenosis-05/2015)  PLAN: 1.   Kelsey Pierce has had some progressive fatigue and dyspnea and exertion. She has known moderate coronary artery disease by coronary CT angiography. Based on her progressive symptoms I'm recommending an exercise Myoview to evaluate for obstructive coronary disease. Her aortic murmur is stable and early peaking suggestive of mild aortic stenosis. We'll reassess that likely exterior. Blood pressure is somewhat elevated, and she reports this is been of the case at home. We may need to readdress this at her next follow-up visit. She has struggled with some depression and anxiety which may  be also causes of fatigue. She would like [place] to assess blood work as well to assure her she is not anemic or deficient in vitamin B12 or hypothyroid or other causes of fatigue. We'll check that today. Follow-up with me in 2 months.  Pixie Casino, MD, [facility] Attending Cardiologist [place] 05/20/2016, 10:08 AM

## 2016-05-20 NOTE — Patient Instructions (Addendum)
Medication Instructions:  Your physician recommends that you continue on your current medications as directed. Please refer to the Current Medication list given to you today.  Labwork: Your physician recommends that you return for lab work in: TODAY- CBC, CMET, CARDIO IQ, TSH, B12 LEVEL  Testing/Procedures: Your physician has requested that you have en exercise stress myoview. For further information please visit [email or web address]. Please follow instruction sheet, as given.   Follow-Up: Your physician recommends that you schedule a follow-up appointment in: 6 weeks after Test with Dr Debara Pickett.  Any Other Special Instructions Will Be Listed Below (If Applicable).     If you need a refill on your cardiac medications before your next appointment, please call your pharmacy.

## 2016-05-21 LAB — COMPREHENSIVE METABOLIC PANEL
ALBUMIN: 4.1 g/dL (ref 3.6–5.1)
ALK PHOS: 76 U/L (ref 33–130)
ALT: 15 U/L (ref 6–29)
AST: 21 U/L (ref 10–35)
BILIRUBIN TOTAL: 0.3 mg/dL (ref 0.2–1.2)
BUN: 21 mg/dL (ref 7–25)
CALCIUM: 10.2 mg/dL (ref 8.6–10.4)
CO2: 25 mmol/L (ref 20–31)
CREATININE: 1.17 mg/dL — AB (ref 0.60–0.93)
Chloride: 105 mmol/L (ref 98–110)
Glucose, Bld: 47 mg/dL — ABNORMAL LOW (ref 65–99)
Potassium: 6 mmol/L — ABNORMAL HIGH (ref 3.5–5.3)
SODIUM: 141 mmol/L (ref 135–146)
TOTAL PROTEIN: 7.6 g/dL (ref 6.1–8.1)

## 2016-05-24 ENCOUNTER — Telehealth: Payer: Self-pay | Admitting: *Deleted

## 2016-05-24 DIAGNOSIS — E875 Hyperkalemia: Secondary | ICD-10-CM

## 2016-05-24 LAB — CARDIO IQ (R) ST2, SOLUBLE: ST2,SOLUBLE: 21 ng/mL (ref <=35)

## 2016-05-24 MED ORDER — AMLODIPINE BESYLATE 10 MG PO TABS
10.0000 mg | ORAL_TABLET | Freq: Every day | ORAL | 6 refills | Status: DC
Start: 2016-05-24 — End: 2016-07-04

## 2016-05-24 NOTE — Telephone Encounter (Signed)
Spoke with pt, aware of medication change. Patient voiced understanding to stop lisinopril and increase amlodipine. She will return for lab work on Monday.

## 2016-05-24 NOTE — Telephone Encounter (Signed)
-----   Message from Pixie Casino, MD sent at 05/24/2016  2:22 PM EDT ----- Wrong test ordered- was supposed to get lipid NMR, not soluble ST2 - labs show elevated creatinine with hyperkalemia. Would stop lisinopril due to hyperkalemia and worsening renal function - watch potassium intake and stop any possible potassium supplements, drink more water - also, increase amlodipine to 10 mg daily to compensate for stopping lisinopriol. Recheck BMET on Monday, next week.  Dr. Lemmie Evens

## 2016-05-28 ENCOUNTER — Telehealth (HOSPITAL_COMMUNITY): Payer: Self-pay

## 2016-05-28 LAB — BASIC METABOLIC PANEL
BUN: 12 mg/dL (ref 7–25)
CALCIUM: 9.7 mg/dL (ref 8.6–10.4)
CO2: 26 mmol/L (ref 20–31)
Chloride: 100 mmol/L (ref 98–110)
Creat: 1.02 mg/dL — ABNORMAL HIGH (ref 0.60–0.93)
Glucose, Bld: 143 mg/dL — ABNORMAL HIGH (ref 65–99)
Potassium: 4.6 mmol/L (ref 3.5–5.3)
SODIUM: 136 mmol/L (ref 135–146)

## 2016-05-28 NOTE — Telephone Encounter (Signed)
Encounter complete. 

## 2016-05-30 ENCOUNTER — Ambulatory Visit (HOSPITAL_COMMUNITY)
Admission: RE | Admit: 2016-05-30 | Discharge: 2016-05-30 | Disposition: A | Discharge status: Home / Self Care | Payer: Medicare Other | Source: Ambulatory Visit | Attending: Cardiovascular Disease | Admitting: Cardiovascular Disease

## 2016-05-30 DIAGNOSIS — R0609 Other forms of dyspnea: Secondary | ICD-10-CM | POA: Diagnosis not present

## 2016-05-30 DIAGNOSIS — Z8249 Family history of ischemic heart disease and other diseases of the circulatory system: Secondary | ICD-10-CM | POA: Diagnosis not present

## 2016-05-30 DIAGNOSIS — E109 Type 1 diabetes mellitus without complications: Secondary | ICD-10-CM | POA: Diagnosis not present

## 2016-05-30 DIAGNOSIS — R002 Palpitations: Secondary | ICD-10-CM | POA: Insufficient documentation

## 2016-05-30 DIAGNOSIS — I10 Essential (primary) hypertension: Secondary | ICD-10-CM | POA: Diagnosis not present

## 2016-05-30 DIAGNOSIS — I25119 Atherosclerotic heart disease of native coronary artery with unspecified angina pectoris: Secondary | ICD-10-CM | POA: Diagnosis not present

## 2016-05-30 DIAGNOSIS — R5383 Other fatigue: Secondary | ICD-10-CM

## 2016-05-30 DIAGNOSIS — R0602 Shortness of breath: Secondary | ICD-10-CM | POA: Diagnosis present

## 2016-05-30 LAB — MYOCARDIAL PERFUSION IMAGING
CHL CUP MPHR: 148 {beats}/min
CHL CUP NUCLEAR SDS: 0
CHL CUP NUCLEAR SRS: 0
CHL CUP NUCLEAR SSS: 0
CHL CUP RESTING HR STRESS: 79 {beats}/min
CSEPED: 6 min
CSEPEW: 8 METS
CSEPHR: 95 %
CSEPPHR: 142 {beats}/min
Exercise duration (sec): 41 s
LV dias vol: 78 mL (ref 46–106)
LV sys vol: 25 mL
NUC STRESS TID: 1.22
RPE: 17

## 2016-05-30 MED ORDER — TECHNETIUM TC 99M TETROFOSMIN IV KIT
30.8000 | PACK | Freq: Once | INTRAVENOUS | Status: AC | PRN
Start: 2016-05-30 — End: 2016-05-30
  Administered 2016-05-30: 30.8 via INTRAVENOUS
  Filled 2016-05-30: qty 31

## 2016-05-30 MED ORDER — TECHNETIUM TC 99M TETROFOSMIN IV KIT
10.6000 | PACK | Freq: Once | INTRAVENOUS | Status: AC | PRN
  Administered 2016-05-30: 10.6 via INTRAVENOUS
  Filled 2016-05-30: qty 11

## 2016-06-05 ENCOUNTER — Encounter: Payer: Self-pay | Admitting: Obstetrics and Gynecology

## 2016-06-05 ENCOUNTER — Ambulatory Visit (INDEPENDENT_AMBULATORY_CARE_PROVIDER_SITE_OTHER): Payer: Medicare Other | Admitting: Obstetrics and Gynecology

## 2016-06-05 VITALS — BP 140/70 | HR 70 | Resp 16 | Ht 63.0 in | Wt 163.2 lb

## 2016-06-05 DIAGNOSIS — Z01411 Encounter for gynecological examination (general) (routine) with abnormal findings: Secondary | ICD-10-CM

## 2016-06-05 DIAGNOSIS — Z0000 Encounter for general adult medical examination without abnormal findings: Secondary | ICD-10-CM | POA: Diagnosis not present

## 2016-06-05 DIAGNOSIS — N9089 Other specified noninflammatory disorders of vulva and perineum: Secondary | ICD-10-CM

## 2016-06-05 LAB — POCT URINALYSIS DIPSTICK
Bilirubin, UA: NEGATIVE
Ketones, UA: NEGATIVE
Leukocytes, UA: NEGATIVE
NITRITE UA: NEGATIVE
RBC UA: NEGATIVE
UROBILINOGEN UA: NEGATIVE
pH, UA: 6

## 2016-06-05 NOTE — Progress Notes (Signed)
70 y.o. G24P2012 Widowed Caucasian female here for annual exam.    No vaginal bleeding or spotting.   Last HgbA1C was 6.3.  Lichen sclerosus is much better.  No flares.  Uses Clobetasol ointment once a week. Does not need refill of Clobetasol.   Thinks she is lactose intolerant.  Starting to use Lactaid.   States she has always had fuzz on her face.  She does some shaving because she cannot see well to pluck the hair.  Enjoys the grandchildren.  49 and 14 yo.   PCP:   Burnard Bunting, MD  Patient's last menstrual period was 08/06/1999.           Sexually active: No. female The current method of family planning is post menopausal status.    Exercising: Yes.    Walks 30 minutes 3x/week.  Walks 3 miles each time.  Smoker:  no   Health Maintenance: Pap:  05-10-15 Neg History of abnormal Pap:  no MMG:  12-04-15 Density C/Benign scattered calcifications both breasts/Neg/BiRads2:[name] Colonoscopy:   03-25-13 polyp with Dr. Scarlette Shorts.  Next due 03/2018. BMD:  2013  Result  Normal with Dr. Reynaldo Minium TDaP:  01/2015 Gardasil:   N/A Hep C:  Will discuss with PCP. Screening Labs:  Hb today: PCP, Urine today: Mod.glucose   reports that she has never smoked. She has never used smokeless tobacco. She reports that she does not drink alcohol or use drugs.  Past Medical History:  Diagnosis Date  . Adenomatous colon polyp 2014   repeat colon in 5 years  . Bilateral cataracts   . Chest pain    2D ECHO, 05/29/2011 -EF >55%, normal; MYOVIEW, 05/29/2011 - normal  . Depression   . Diabetes ([place])   . Fracture, foot june 2014   left  . Glaucoma   . Hyperlipidemia   . Hypertension   . Lichen sclerosus et atrophicus of the vulva 05/2014  . Neuropathy ([facility])    legs  . PMB (postmenopausal bleeding)     Past Surgical History:  Procedure Laterality Date  . CARPAL TUNNEL RELEASE Bilateral   . CATARACT EXTRACTION W/ INTRAOCULAR LENS IMPLANT Bilateral 06/2015  . CESAREAN SECTION  1974  .  CHOLECYSTECTOMY  1969  . ENDOMETRIAL BIOPSY  09-30-97   Dr Ricard Dillon  . HYSTEROSCOPY  02-10-97   D&C, polyp--focal hyperplasia w/o atypia  . TUBAL LIGATION Bilateral     Current Outpatient Prescriptions  Medication Sig Dispense Refill  . ACCU-CHEK AVIVA PLUS test strip     . ALPRAZolam (XANAX) 0.25 MG tablet Take 0.25 mg by mouth every 8 (eight) hours.    Marland Kitchen amLODipine (NORVASC) 10 MG tablet Take 1 tablet (10 mg total) by mouth daily. 30 tablet 6  . clobetasol ointment (TEMOVATE) 0.05 % Apply topically 2 (two) times daily. Use twice weekly to control lichen sclerosus symptoms. 30 g 0  . DULoxetine (CYMBALTA) 30 MG capsule Take 30 mg by mouth daily.     Marland Kitchen HUMALOG 100 UNIT/ML injection     . HYDROcodone-acetaminophen (NORCO/VICODIN) 5-325 MG per tablet Take 1 tablet by mouth as needed.    . Insulin Human (INSULIN PUMP) 100 unit/ml SOLN Inject into the skin.    Marland Kitchen latanoprost (XALATAN) 0.005 % ophthalmic solution Place 1 drop into both eyes at bedtime.    . Multiple Vitamin (MULTIVITAMIN) tablet Take 1 tablet by mouth daily.    . rosuvastatin (CRESTOR) 10 MG tablet Take 10 mg by mouth at bedtime.    Marland Kitchen  traZODone (DESYREL) 50 MG tablet Take 50 mg by mouth at bedtime.     No current facility-administered medications for this visit.     Family History  Problem Relation Age of Onset  . Cancer Mother     Lung cancer  . Stroke Father 15  . Heart disease Father   . Hypertension Father   . Cancer Brother     Liver cancer  . Cancer Maternal Grandmother     Colon cancer  . Colon cancer Maternal Grandmother 41  . Heart attack Maternal Grandfather   . Heart attack Paternal Grandmother   . Alzheimer's disease Paternal Grandfather   . Heart disease Brother 60    ROS:  Pertinent items are noted in HPI.  Otherwise, a comprehensive ROS was negative.  Exam:   BP 140/70 (BP Location: Right Arm, Patient Position: Sitting, Cuff Size: Normal)   Pulse 70   Resp 16   Ht 5\' 3"  (1.6 m)   Wt  163 lb 3.2 oz (74 kg)   LMP 08/06/1999   BMI 28.91 kg/m     General appearance: alert, cooperative and appears stated age Head: Normocephalic, without obvious abnormality, atraumatic Neck: no adenopathy, supple, symmetrical, trachea midline and thyroid normal to inspection and palpation Lungs: clear to auscultation bilaterally Breasts: normal appearance, no masses or tenderness, No nipple retraction or dimpling, No nipple discharge or bleeding, No axillary or supraclavicular adenopathy Heart: regular rate and rhythm Abdomen: right upper abdominal incision and low vertical incision - both well healed, soft, non-tender; no masses, no organomegaly.  Insulin pump in place on abd. Skin.  Extremities: extremities normal, atraumatic, no cyanosis or edema Skin: Skin color, texture, turgor normal. No rashes or lesions Lymph nodes: Cervical, supraclavicular, and axillary nodes normal. No abnormal inguinal nodes palpated Neurologic: Grossly normal  Pelvic: External genitalia:  Hypopigmentation of the vulva.  Fissure of the perineum.  Enlarged clitoris. Skin retracted around this and has been this way long term.               Urethra:  normal appearing urethra with no masses, tenderness or lesions              Bartholins and Skenes: normal                 Vagina: normal appearing vagina with normal color and discharge, no lesions              Cervix: no lesions              Pap taken: No. Bimanual Exam:  Uterus:  normal size, contour, position, consistency, mobility, non-tender              Adnexa: no mass, fullness, tenderness              Rectal exam: Yes.  .  Confirms.              Anus:  normal sphincter tone, no lesions  Chaperone was present for exam.  Assessment:   Well woman visit with normal exam. Lichen sclerosus of vulva. Clitoromegaly. I think that this may be from retraction of the epithelium due to lichen sclerosus but will check testosterone level. DM.  Plan: Yearly mammogram  recommended after age 80.  Pap and HR HPV as above. Discussed Calcium, Vitamin D, regular exercise program including cardiovascular and weight bearing exercise. Continue Clobetasol bid prn for flares of lichen sclerosus.  Check testosterone. ABN form signed.  Follow up annually and  prn.       After visit summary provided.

## 2016-06-05 NOTE — Patient Instructions (Signed)

## 2016-06-09 LAB — TESTOSTERONE, FREE, LC/MS/MS: Testosterone, Free, LCM/MS/MS: 4.8 pg/mL (ref 0.3–5.0)

## 2016-07-04 ENCOUNTER — Ambulatory Visit (INDEPENDENT_AMBULATORY_CARE_PROVIDER_SITE_OTHER): Payer: Medicare Other | Admitting: Internal Medicine

## 2016-07-04 ENCOUNTER — Encounter: Payer: Self-pay | Admitting: Internal Medicine

## 2016-07-04 VITALS — BP 148/71 | HR 77 | Ht 62.0 in | Wt 162.2 lb

## 2016-07-04 DIAGNOSIS — I10 Essential (primary) hypertension: Secondary | ICD-10-CM | POA: Diagnosis not present

## 2016-07-04 DIAGNOSIS — R600 Localized edema: Secondary | ICD-10-CM

## 2016-07-04 DIAGNOSIS — E875 Hyperkalemia: Secondary | ICD-10-CM | POA: Diagnosis not present

## 2016-07-04 DIAGNOSIS — R0602 Shortness of breath: Secondary | ICD-10-CM

## 2016-07-04 DIAGNOSIS — I350 Nonrheumatic aortic (valve) stenosis: Secondary | ICD-10-CM

## 2016-07-04 MED ORDER — CHLORTHALIDONE 25 MG PO TABS: 12.5000 mg | ORAL_TABLET | Freq: Every day | ORAL | 3 refills | Status: DC

## 2016-07-04 MED ORDER — AMLODIPINE BESYLATE 5 MG PO TABS: 5.0000 mg | ORAL_TABLET | Freq: Every day | ORAL | 3 refills | Status: DC

## 2016-07-04 NOTE — Progress Notes (Signed)
OFFICE NOTE  Chief Complaint:  Fatigue, dyspnea, follow-up stress test  Primary Care Physician: Geoffery Lyons, MD  HPI:  Kelsey Pierce  is a 70 year old female with a history of epigastric pain, a systolic murmur, which on echo there was only trace amount of MR and mild TR. She also had mild concentric LVH and left atrial enlargement with a normal LV systolic function. There was not  impaired diastolic dysfunction. I have talked about some of the signs of hypertensive heart disease and felt that we should treat her more aggressively for her blood pressure and recommended starting lisinopril 20 mg daily. She has since followed up with Altha Harm, our clinical pharmacist, who had added 5 mg of amlodipine for blood pressures that remained elevated. I did review a two month blood pressure chart from her, which shows very good control of her blood pressure between [id number] to [id number] systolic. Heart rate has ranged basically in the 60s and low 70s.   [name] returns today for followup. Symptomatically, she is doing very well. Denies any chest pain, worsening shortness of breath, or any associated problems. She is overdue for an echocardiogram to evaluate for change in her valvular heart disease.  05/20/2016  [name] was seen today for follow-up. At her last office visit I repeated an echocardiogram as she had a louder systolic murmur. LVEF was 65-70% with grade 1 diastolic dysfunction and very mild aortic stenosis with a mean gradient of 8 mmHg. There was a questionable mobile density in the right atrium. A CT angiogram was recommended. This was performed on 06/19/2015. No mobile density was noted, however she was found to have coronary artery disease. Specifically her coronary artery calcium score was 678. There was moderate coronary disease in the proximal LAD and RCA as well as mild concentric LVH. She was asymptomatic the time and no further testing was undertaken however she  reports over the past several months she's become progressively more fatigue, particularly with exertion. She also has less energy overall and does get short of breath doing things like raking leaves. She denies any chest pain. She reports poor sleep at night however this is a chronic problem.  07/04/2016  [name] was seen back today in follow-up. She underwent a nuclear stress test which is negative for ischemia. She notes an improvement in her fatigue, particularly after discontinuing her lisinopril. She was found to have some acute renal insufficiency and hyperkalemia. The lisinopril was stopped to allow for this and she says that she feels better. It may be whatever caused her to have some renal dysfunction was the culprit for her symptoms. I subsequently increased her amlodipine to 10 mg to compensate for her blood pressure change although blood pressure is mildly elevated today and she notes more leg swelling.  PMHx:  Past Medical History:  Diagnosis Date  . Adenomatous colon polyp 2014   repeat colon in 5 years  . Bilateral cataracts   . Chest pain    2D ECHO, 05/29/2011 -EF >55%, normal; MYOVIEW, 05/29/2011 - normal  . Depression   . Diabetes ([place])   . Fracture, foot june 2014   left  . Glaucoma   . Hyperlipidemia   . Hypertension   . Lichen sclerosus et atrophicus of the vulva 05/2014  . Neuropathy ([facility])    legs  . PMB (postmenopausal bleeding)     Past Surgical History:  Procedure Laterality Date  . CARPAL TUNNEL RELEASE Bilateral   . CATARACT EXTRACTION W/ INTRAOCULAR  LENS IMPLANT Bilateral 06/2015  . CESAREAN SECTION  1974  . CHOLECYSTECTOMY  1969  . ENDOMETRIAL BIOPSY  09-30-97   Dr Ricard Dillon  . HYSTEROSCOPY  02-10-97   D&C, polyp--focal hyperplasia w/o atypia  . TUBAL LIGATION Bilateral     FAMHx:  Family History  Problem Relation Age of Onset  . Cancer Mother     Lung cancer  . Stroke Father 13  . Heart disease Father   . Hypertension Father     . Cancer Brother     Liver cancer  . Cancer Maternal Grandmother     Colon cancer  . Colon cancer Maternal Grandmother 26  . Heart attack Maternal Grandfather   . Heart attack Paternal Grandmother   . Alzheimer's disease Paternal Grandfather   . Heart disease Brother 44    SOCHx:   reports that she has never smoked. She has never used smokeless tobacco. She reports that she does not drink alcohol or use drugs.  ALLERGIES:  Allergies  Allergen Reactions  . Erythromycin     Abdominal pain    ROS: Pertinent items noted in HPI and remainder of comprehensive ROS otherwise negative.  HOME MEDS: Current Outpatient Prescriptions  Medication Sig Dispense Refill  . ACCU-CHEK AVIVA PLUS test strip     . ALPRAZolam (XANAX) 0.25 MG tablet Take 0.25 mg by mouth every 8 (eight) hours.    Marland Kitchen amLODipine (NORVASC) 5 MG tablet Take 1 tablet (5 mg total) by mouth daily. 90 tablet 3  . clobetasol ointment (TEMOVATE) 0.05 % Apply topically 2 (two) times daily. Use twice weekly to control lichen sclerosus symptoms. 30 g 0  . DULoxetine (CYMBALTA) 30 MG capsule Take 30 mg by mouth daily.     Marland Kitchen HUMALOG 100 UNIT/ML injection     . HYDROcodone-acetaminophen (NORCO/VICODIN) 5-325 MG per tablet Take 1 tablet by mouth as needed.    . Insulin Human (INSULIN PUMP) 100 unit/ml SOLN Inject into the skin.    Marland Kitchen latanoprost (XALATAN) 0.005 % ophthalmic solution Place 1 drop into both eyes at bedtime.    . Multiple Vitamin (MULTIVITAMIN) tablet Take 1 tablet by mouth daily.    . rosuvastatin (CRESTOR) 10 MG tablet Take 10 mg by mouth at bedtime.    . traZODone (DESYREL) 50 MG tablet Take 50 mg by mouth at bedtime.    . chlorthalidone (HYGROTON) 25 MG tablet Take 0.5 tablets (12.5 mg total) by mouth daily. 45 tablet 3   No current facility-administered medications for this visit.     LABS/IMAGING: No results found for this or any previous visit (from the past 48 hour(s)). No results found.  VITALS: BP (!)  148/71   Pulse 77   Ht 5\' 2"  (1.575 m)   Wt 162 lb 3.2 oz (73.6 kg)   LMP 08/06/1999   SpO2 99%   BMI 29.67 kg/m   EXAM: General appearance: alert and no distress Neck: no adenopathy, no carotid bruit, no JVD, supple, symmetrical, trachea midline and thyroid not enlarged, symmetric, no tenderness/mass/nodules Lungs: clear to auscultation bilaterally Heart: regular rate and rhythm, S1, S2 normal [id number] systolic murmur at the RUSB, 2/6 murmur at the apex Abdomen: soft, non-tender; bowel sounds normal; no masses,  no organomegaly Extremities: 1+ edema Pulses: 2+ and symmetric Skin: Skin color, texture, turgor normal. No rashes or lesions Neurologic: Grossly normal  EKG: Deferred  ASSESSMENT: 1. Progressive fatigue and dyspnea - low risk stress test, no ischemia (06/2016) 2. Coronary artery disease with moderate  proximal LAD and RCA stenosis and a coronary calcium score of 678 (06/2015) - by CT coronary angiography 3. Hypertension-uncontrolled 4. Anxiety  5. Palpitations - resolved 6. Insulin-dependent diabetes 7. Depression 8. Murmur (mild aortic stenosis-05/2015)  PLAN: 1.   Mrs. Moroyoqui had some progressive fatigue and dyspnea was noted to have some acute kidney injury with hyperkalemia. She was taken off of lisinopril and her amlodipine was increased. She now has some worsening lower extremity edema. We will need to decrease her amlodipine back to 5 mg daily. I provided her with compression stockings and recommended starting low-dose chlorthalidone for diuresis. Will plan to recheck a metabolic profile next week. She'll need a hypertension clinic follow-up in a few weeks. She does report her fatigue and dyspnea have improved which means it could've been related more likely to whatever caused her renal dysfunction, not likely her ACE inhibitor which has been on for a number of years although acute discontinuation of that probably helped with her hyperkalemia.  Ultimately follow-up  with me in 6 months.  Pixie Casino, MD, [facility], [place] Attending Cardiologist [place] 07/04/2016, 11:00 AM

## 2016-07-04 NOTE — Patient Instructions (Addendum)
Medication Instructions:   DECREASE AMLODIPINE TO 5 MG ONCE DAILY= 1/2 OF THE 10 MG TABLET ONCE DAILY  START CHLORTHALIDONE 12.5 MG ONCE DAILY= 1/2 OF THE 25 MG TABLET ONCE DAILY   Labwork:  Your physician recommends that you return for lab work in: ONE WEEK  Follow-Up:  Your physician wants you to follow-up in: [place] will receive a reminder letter in the mail two months in advance. If you don't receive a letter, please call our office to schedule the follow-up appointment.   COMPRESSION HOSE WEAR DURING THE DAY AND REMOVE AT BEDTIME

## 2016-07-12 LAB — BASIC METABOLIC PANEL
BUN: 15 mg/dL (ref 7–25)
CHLORIDE: 100 mmol/L (ref 98–110)
CO2: 31 mmol/L (ref 20–31)
CREATININE: 0.86 mg/dL (ref 0.60–0.93)
Calcium: 9.8 mg/dL (ref 8.6–10.4)
GLUCOSE: 71 mg/dL (ref 65–99)
Potassium: 4.6 mmol/L (ref 3.5–5.3)
SODIUM: 139 mmol/L (ref 135–146)

## 2016-07-12 LAB — MAGNESIUM: Magnesium: 1.8 mg/dL (ref 1.5–2.5)

## 2016-07-16 ENCOUNTER — Encounter: Payer: Self-pay | Admitting: Internal Medicine

## 2016-08-02 ENCOUNTER — Encounter: Payer: Self-pay | Admitting: Pharmacist

## 2016-08-02 ENCOUNTER — Ambulatory Visit (INDEPENDENT_AMBULATORY_CARE_PROVIDER_SITE_OTHER): Payer: Medicare Other | Admitting: Pharmacist

## 2016-08-02 VITALS — BP 132/66 | HR 77

## 2016-08-02 DIAGNOSIS — I10 Essential (primary) hypertension: Secondary | ICD-10-CM

## 2016-08-02 NOTE — Patient Instructions (Addendum)
Return for a follow up appointment as needed if pressures become elevated (>130/80 consistently) [phone]  Check your blood pressure at home daily (if able) and keep record of the readings.  Take your BP meds as follows: Continue medications as prescribed  Bring all of your meds, your BP cuff and your record of home blood pressures to your next appointment.  Exercise as you're able, try to walk approximately 30 minutes per day.  Keep salt intake to a minimum, especially watch canned and prepared boxed foods.  Eat more fresh fruits and vegetables and fewer canned items.  Avoid eating in fast food restaurants.    HOW TO TAKE YOUR BLOOD PRESSURE: . Rest 5 minutes before taking your blood pressure. .  Don't smoke or drink caffeinated beverages for at least 30 minutes before. . Take your blood pressure before (not after) you eat. . Sit comfortably with your back supported and both feet on the floor (don't cross your legs). . Elevate your arm to heart level on a table or a desk. . Use the proper sized cuff. It should fit smoothly and snugly around your bare upper arm. There should be enough room to slip a fingertip under the cuff. The bottom edge of the cuff should be 1 inch above the crease of the elbow. . Ideally, take 3 measurements at one sitting and record the average.

## 2016-08-02 NOTE — Progress Notes (Signed)
Patient ID: AJANAY GAMET                 DOB: Dec 22, 1945                      MRN: [id number]     HPI: Kelsey Pierce is a 70 y.o. female patient of Dr. Debara Pickett with PMH below who presents today for hypertension evaluation. Some time back her ACEi was discontinued due to hyperkalemia and acute renal dysfunction. It was not believed that ACEi caused renal dysfunction as she had been tolerating for years, but it was discontinued acutely to help with hyperkalemia and has not been resumed since. At her most recent visit with Dr. Debara Pickett her amlodipine was cut back to 5mg  daily due to leg edema. She was also started on low dose chlorthalidone. Since this visit her amlodipine was increased to 7.5mg  daily about 2 weeks ago.   She presents today stating she is doing very well since the increase of amlodipine dose. She has not had any issues with swelling and her pressures are greatly improved.    Cardiac Hx: HTN, LVH, CAD, insulin dependent DM, HLD, bilateral leg edema  Current HTN meds:  Chlorthalidone 12.5mg  daily Amlodipine 7.5mg  daily  Previously tried:  Amlodipine 10mg  - edema  BP goal: <130/80  Family History: Her father had hypertension and passed at age 60 of heart attack. Her brother had open heart surgery and has since passed away.   Social History: Denies tobacco products and alcohol  Diet: She eats most of her meals from home and endorses using salt while preparing meals. She does not add salt at the table. She drinks 1 cup of coffee per day and denies soda or tea.   Exercise: She has not been very active the last few weeks, but plans to go back to her walking next week. She and her daughter walk 3 miles three times a week.   Home BP readings: She has a new omron arm cuff Since increase in amlodipine dose - avg 125/69 (excluding 2 measurements on christmas - for which she was very stress that measured 143/80 and 143/72)  Wt Readings from Last 3 Encounters:  07/04/16 162 lb 3.2  oz (73.6 kg)  06/05/16 163 lb 3.2 oz (74 kg)  05/30/16 168 lb (76.2 kg)   BP Readings from Last 3 Encounters:  08/02/16 132/66  07/04/16 (!) 148/71  06/05/16 140/70   Pulse Readings from Last 3 Encounters:  08/02/16 77  07/04/16 77  06/05/16 70    Renal function: CrCl cannot be calculated (Patient's most recent lab result is older than the maximum 21 days allowed.).  Past Medical History:  Diagnosis Date  . Adenomatous colon polyp 2014   repeat colon in 5 years  . Bilateral cataracts   . Chest pain    2D ECHO, 05/29/2011 -EF >55%, normal; MYOVIEW, 05/29/2011 - normal  . Depression   . Diabetes ([place])   . Fracture, foot june 2014   left  . Glaucoma   . Hyperlipidemia   . Hypertension   . Lichen sclerosus et atrophicus of the vulva 05/2014  . Neuropathy ([facility])    legs  . PMB (postmenopausal bleeding)     Current Outpatient Prescriptions on File Prior to Visit  Medication Sig Dispense Refill  . ACCU-CHEK AVIVA PLUS test strip     . ALPRAZolam (XANAX) 0.25 MG tablet Take 0.25 mg by mouth every 8 (eight) hours.    Marland Kitchen  amLODipine (NORVASC) 5 MG tablet Take 1 tablet (5 mg total) by mouth daily. (Patient taking differently: Take 7.5 mg by mouth daily. ) 90 tablet 3  . chlorthalidone (HYGROTON) 25 MG tablet Take 0.5 tablets (12.5 mg total) by mouth daily. 45 tablet 3  . clobetasol ointment (TEMOVATE) 0.05 % Apply topically 2 (two) times daily. Use twice weekly to control lichen sclerosus symptoms. 30 g 0  . DULoxetine (CYMBALTA) 30 MG capsule Take 30 mg by mouth daily.     Marland Kitchen HUMALOG 100 UNIT/ML injection Insulin pump    . HYDROcodone-acetaminophen (NORCO/VICODIN) 5-325 MG per tablet Take 1 tablet by mouth as needed.    . Insulin Human (INSULIN PUMP) 100 unit/ml SOLN Inject into the skin.    Marland Kitchen latanoprost (XALATAN) 0.005 % ophthalmic solution Place 1 drop into both eyes at bedtime.    . rosuvastatin (CRESTOR) 10 MG tablet Take 10 mg by mouth at bedtime.    . traZODone (DESYREL)  50 MG tablet Take 25 mg by mouth at bedtime.      No current facility-administered medications on file prior to visit.     Allergies  Allergen Reactions  . Erythromycin     Abdominal pain  . Lisinopril Other (See Comments)    Stopped due to acute kidney injury and hyperkalemia    Blood pressure 132/66, pulse 77, last menstrual period 08/06/1999, SpO2 98 %.   Assessment/Plan: Hypertension: BP borderline at goal today and much improved on current regimen. Will defer medication changes at this time as she began higher dose of amlodipine about 2 weeks ago and the holidays have been especially stressful for her. She prefers to call for next appt if needed for high pressures than to schedule follow up today. Follow up with hypertension clinic as needed.    Thank you, Lelan Pons. Patterson Hammersmith, [facility]  08/02/2016 3:00 PM

## 2016-08-30 ENCOUNTER — Telehealth: Payer: Self-pay | Admitting: *Deleted

## 2016-08-30 NOTE — Telephone Encounter (Signed)
Patient left a msg on the refill vm stating that her pharmacy will not refill the chlorthalidone until 2/6. She says that what happened is that when she splits the tablet sometimes it turns to powder. Patient can be reached at [phone]. Thanks, MI

## 2016-08-30 NOTE — Telephone Encounter (Signed)
Spoke with patients pharmacy, they are not allowed to cut tablets in 1/2. They recommended the patient call her insurance and let them know the tablets are crumbling when cut in 1/2 and they will probably give her an override. Patient made aware.

## 2016-11-01 ENCOUNTER — Other Ambulatory Visit: Payer: Self-pay | Admitting: Obstetrics and Gynecology

## 2016-11-04 NOTE — Telephone Encounter (Signed)
Medication refill request: Clobetasol Ointment Last AEX:  06/05/16 BS Next AEX: 06/18/17 BS Last MMG (if hormonal medication request): 12/04/15 BIRADS2, Density C, [name] Refill authorized: 11/21/15 #[id number]. Please advise. Thank you.

## 2016-11-18 ENCOUNTER — Telehealth: Payer: Self-pay | Admitting: Internal Medicine

## 2016-11-18 MED ORDER — AMLODIPINE BESYLATE 5 MG PO TABS: 7.5000 mg | ORAL_TABLET | Freq: Every day | ORAL | 6 refills | Status: DC

## 2016-11-18 NOTE — Telephone Encounter (Signed)
SPOKE TO PATIENT - NEED  A NEW PRESCRIPTION  FROM SENT TO PHARMACY - AMLODIPINE 7.5 MG ( TOTAL 1.5 TABLET) patient Has been receiving direction bottle -(take 1 tablet daily)  Spoke to phaarmacist - verbal order given for above direction 7.5 mg daily #45 x6 refills.  Patient aware

## 2016-11-18 NOTE — Telephone Encounter (Signed)
Pt says she needs a new prescription written for Amlodipine,the directions are different.Pt would like for you to call this in today if possible,please.

## 2016-12-16 ENCOUNTER — Encounter: Payer: Self-pay | Admitting: Internal Medicine

## 2016-12-16 ENCOUNTER — Ambulatory Visit (INDEPENDENT_AMBULATORY_CARE_PROVIDER_SITE_OTHER): Payer: Medicare Other | Admitting: Internal Medicine

## 2016-12-16 VITALS — BP 156/76 | HR 69 | Ht 62.0 in | Wt 164.0 lb

## 2016-12-16 DIAGNOSIS — E782 Mixed hyperlipidemia: Secondary | ICD-10-CM | POA: Diagnosis not present

## 2016-12-16 DIAGNOSIS — I10 Essential (primary) hypertension: Secondary | ICD-10-CM | POA: Diagnosis not present

## 2016-12-16 DIAGNOSIS — I350 Nonrheumatic aortic (valve) stenosis: Secondary | ICD-10-CM

## 2016-12-16 DIAGNOSIS — I2510 Atherosclerotic heart disease of native coronary artery without angina pectoris: Secondary | ICD-10-CM | POA: Diagnosis not present

## 2016-12-16 NOTE — Progress Notes (Signed)
OFFICE NOTE  Chief Complaint:  No complaints  Primary Care Physician: Burnard Bunting, MD  HPI:  Kelsey Pierce  is a 71 year old female with a history of epigastric pain, a systolic murmur, which on echo there was only trace amount of MR and mild TR. She also had mild concentric LVH and left atrial enlargement with a normal LV systolic function. There was not  impaired diastolic dysfunction. I have talked about some of the signs of hypertensive heart disease and felt that we should treat her more aggressively for her blood pressure and recommended starting lisinopril 20 mg daily. She has since followed up with Altha Harm, our clinical pharmacist, who had added 5 mg of amlodipine for blood pressures that remained elevated. I did review a two month blood pressure chart from her, which shows very good control of her blood pressure between 539 to 767 systolic. Heart rate has ranged basically in the 60s and low 70s.   Kelsey Pierce returns today for followup. Symptomatically, she is doing very well. Denies any chest pain, worsening shortness of breath, or any associated problems. She is overdue for an echocardiogram to evaluate for change in her valvular heart disease.  05/20/2016  Kelsey Pierce was seen today for follow-up. At her last office visit I repeated an echocardiogram as she had a louder systolic murmur. LVEF was 65-70% with grade 1 diastolic dysfunction and very mild aortic stenosis with a mean gradient of 8 mmHg. There was a questionable mobile density in the right atrium. A CT angiogram was recommended. This was performed on 06/19/2015. No mobile density was noted, however she was found to have coronary artery disease. Specifically her coronary artery calcium score was 678. There was moderate coronary disease in the proximal LAD and RCA as well as mild concentric LVH. She was asymptomatic the time and no further testing was undertaken however she reports over the past several  months she's become progressively more fatigue, particularly with exertion. She also has less energy overall and does get short of breath doing things like raking leaves. She denies any chest pain. She reports poor sleep at night however this is a chronic problem.  07/04/2016  Kelsey Pierce was seen back today in follow-up. She underwent a nuclear stress test which is negative for ischemia. She notes an improvement in her fatigue, particularly after discontinuing her lisinopril. She was found to have some acute renal insufficiency and hyperkalemia. The lisinopril was stopped to allow for this and she says that she feels better. It may be whatever caused her to have some renal dysfunction was the culprit for her symptoms. I subsequently increased her amlodipine to 10 mg to compensate for her blood pressure change although blood pressure is mildly elevated today and she notes more leg swelling.  12/16/2016  Kelsey Pierce was seen today in follow-up. Overall seems to be doing pretty well. She denies any worsening shortness of breath. She reports walking regularly without symptoms of chest pain or dyspnea. Blood pressure control is much better. Initially was 156/76 but a recheck blood pressure came down to 130/56. She's not had any significant swelling. She does report she takes chlorthalidone at night. She gets up usually once a night to urinate and this may be improved by taking the medicine the morning. Overall her morning blood pressures around 341 systolic. She does have mild aortic stenosis by echo in 2016. A stress test last year showed no ischemia with an EF of 68%.  PMHx:  Past Medical History:  Diagnosis Date  . Adenomatous colon polyp 2014   repeat colon in 5 years  . Bilateral cataracts   . Chest pain    2D ECHO, 05/29/2011 -EF >55%, normal; MYOVIEW, 05/29/2011 - normal  . Depression   . Diabetes ([place])   . Fracture, foot june 2014   left  . Glaucoma   . Hyperlipidemia   . Hypertension   .  Lichen sclerosus et atrophicus of the vulva 05/2014  . Neuropathy    legs  . PMB (postmenopausal bleeding)     Past Surgical History:  Procedure Laterality Date  . CARPAL TUNNEL RELEASE Bilateral   . CATARACT EXTRACTION W/ INTRAOCULAR LENS IMPLANT Bilateral 06/2015  . CESAREAN SECTION  1974  . CHOLECYSTECTOMY  1969  . ENDOMETRIAL BIOPSY  09-30-97   Dr Ricard Dillon  . HYSTEROSCOPY  02-10-97   D&C, polyp--focal hyperplasia w/o atypia  . TUBAL LIGATION Bilateral     FAMHx:  Family History  Problem Relation Age of Onset  . Cancer Mother        Lung cancer  . Stroke Father 53  . Heart disease Father   . Hypertension Father   . Cancer Brother        Liver cancer  . Cancer Maternal Grandmother        Colon cancer  . Colon cancer Maternal Grandmother 42  . Heart attack Maternal Grandfather   . Heart attack Paternal Grandmother   . Alzheimer's disease Paternal Grandfather   . Heart disease Brother 64    SOCHx:   reports that she has never smoked. She has never used smokeless tobacco. She reports that she does not drink alcohol or use drugs.  ALLERGIES:  Allergies  Allergen Reactions  . Erythromycin     Abdominal pain  . Lisinopril Other (See Comments)    Stopped due to acute kidney injury and hyperkalemia    ROS: Pertinent items noted in HPI and remainder of comprehensive ROS otherwise negative.  HOME MEDS: Current Outpatient Prescriptions  Medication Sig Dispense Refill  . ACCU-CHEK AVIVA PLUS test strip     . ALPRAZolam (XANAX) 0.25 MG tablet Take 0.25 mg by mouth every 8 (eight) hours.    Marland Kitchen amLODipine (NORVASC) 5 MG tablet Take 1.5 tablets (7.5 mg total) by mouth daily. 45 tablet 6  . chlorthalidone (HYGROTON) 25 MG tablet Take 0.5 tablets (12.5 mg total) by mouth daily. 45 tablet 3  . clobetasol ointment (TEMOVATE) 0.05 % Apply topically 2 (two) times a week. Use twice weekly as needed to control symptoms. 30 g 0  . DULoxetine (CYMBALTA) 30 MG capsule Take  30 mg by mouth daily.     Marland Kitchen HUMALOG 100 UNIT/ML injection Insulin pump    . HYDROcodone-acetaminophen (NORCO/VICODIN) 5-325 MG per tablet Take 1 tablet by mouth as needed.    . Insulin Human (INSULIN PUMP) 100 unit/ml SOLN Inject into the skin.    Marland Kitchen latanoprost (XALATAN) 0.005 % ophthalmic solution Place 1 drop into both eyes at bedtime.    . rosuvastatin (CRESTOR) 10 MG tablet Take 10 mg by mouth at bedtime.    . traZODone (DESYREL) 50 MG tablet Take 25 mg by mouth at bedtime.      No current facility-administered medications for this visit.     LABS/IMAGING: No results found for this or any previous visit (from the past 48 hour(s)). No results found.  VITALS: BP (!) 156/76   Pulse 69   Ht 5\' 2"  (1.575 m)  Wt 164 lb (74.4 kg)   LMP 08/06/1999   BMI 30.00 kg/m   EXAM: General appearance: alert and no distress Neck: no carotid bruit, no JVD and thyroid not enlarged, symmetric, no tenderness/mass/nodules Lungs: clear to auscultation bilaterally Heart: regular rate and rhythm, S1, S2 normal and systolic murmur: early systolic 2/6, crescendo at 2nd right intercostal space Abdomen: soft, non-tender; bowel sounds normal; no masses,  no organomegaly Extremities: extremities normal, atraumatic, no cyanosis or edema Pulses: 2+ and symmetric Skin: Skin color, texture, turgor normal. No rashes or lesions Neurologic: Grossly normal Psych: Mildly anxious  EKG: Normal sinus rhythm at 69  ASSESSMENT: 1. History of fatigue and dyspnea - low risk stress test, no ischemia (06/2016) 2. Coronary artery disease with moderate proximal LAD and RCA stenosis and a coronary calcium score of 678 (06/2015) - by CT coronary angiography 3. Hypertension-controlled 4. Anxiety  5. Palpitations - resolved 6. Insulin-dependent diabetes 7. Dyslipidemia 8. Depression 9. Murmur (mild aortic stenosis-05/2015)  PLAN: 1.   Kelsey Pierce seems to be doing much better. She does regular walking and denies any  chest pain or worsening dyspnea. She has mild aortic stenosis and will need a repeat echo next year for follow-up. Clinically it sounds mild on exam. Her blood pressure is now better controlled. We'll continue her current medications. Diabetes and cholesterol followed by her primary care provider.   Follow-up with me annually or sooner as necessary.  Pixie Casino, MD, [facility] Attending Cardiologist [place] 12/16/2016, 10:06 AM

## 2016-12-16 NOTE — Patient Instructions (Signed)
Medication Instructions: Your physician recommends that you continue on your current medications as directed. Please refer to the Current Medication list given to you today.  Follow-Up: Your physician wants you to follow-up in: 1 year with Dr. Hilty. You will receive a reminder letter in the mail two months in advance. If you don't receive a letter, please call our office to schedule the follow-up appointment.  If you need a refill on your cardiac medications before your next appointment, please call your pharmacy.  

## 2017-01-01 ENCOUNTER — Ambulatory Visit: Payer: Medicare Other | Admitting: Internal Medicine

## 2017-04-16 ENCOUNTER — Other Ambulatory Visit: Payer: Self-pay | Admitting: Internal Medicine

## 2017-06-13 ENCOUNTER — Other Ambulatory Visit: Payer: Self-pay | Admitting: Internal Medicine

## 2017-06-18 ENCOUNTER — Other Ambulatory Visit (HOSPITAL_COMMUNITY)
Admission: RE | Admit: 2017-06-18 | Discharge: 2017-06-18 | Disposition: A | Discharge status: Home / Self Care | Payer: Medicare Other | Source: Ambulatory Visit | Attending: Obstetrics and Gynecology | Admitting: Obstetrics and Gynecology

## 2017-06-18 ENCOUNTER — Ambulatory Visit (INDEPENDENT_AMBULATORY_CARE_PROVIDER_SITE_OTHER): Payer: Medicare Other | Admitting: Obstetrics and Gynecology

## 2017-06-18 ENCOUNTER — Encounter: Payer: Self-pay | Admitting: Obstetrics and Gynecology

## 2017-06-18 VITALS — BP 142/72 | HR 80 | Resp 14 | Ht 62.0 in | Wt 169.0 lb

## 2017-06-18 DIAGNOSIS — Z01419 Encounter for gynecological examination (general) (routine) without abnormal findings: Secondary | ICD-10-CM

## 2017-06-18 NOTE — Progress Notes (Addendum)
71 y.o. G5P2012 Widowed Caucasian female here for annual exam.    Hx lichen sclerosus.  Has clitoral enlargement which is probably more retraction of the clitoral hood due to lichen sclerosus.  Normal free testosterone level last year of 4.8. Using clobetasol ointment twice a week to control her vulvar symptoms.  Needs a refill.  No vaginal bleeding.   No urinary incontinence.  Not voiding often as she is not drinking a lot of fluid. "Has diarrhea more than she would like to have." Goes for long periods of time with no diarrhea and then have it for 2 -3 days.  ROS - depression, hair loss, heart murmur. Depression being treated through PCP, and is stable per patient.  Sees Dr. Debara Pickett, cardiology, for her heart murmur. Hair loss is chronic.  HgbA1C 6.9 with PCP.  PCP:  Burnard Bunting, MD   Patient's last menstrual period was 08/06/1999 (approximate).           Sexually active: No.  The current method of family planning is post menopausal status.    Exercising: No.   Smoker:  no  Health Maintenance: Pap: 05-10-15 Neg, 03-31-09 Neg History of abnormal Pap:  no MMG: 01-10-17 3D Density C/Bx clip in Rt.Br/Neg/BiRads1:[name] Colonoscopy: 03-25-13 polyp with Dr. Scarlette Shorts. Next due 03/2018. BMD: 2013  Result: Normal with Dr.Aronson TDaP:  01/2015 Gardasil:   no HIV:not needed per PCP Hep C: not needed per PCP Screening Labs:  Hb today: PCP, Urine today: not done   reports that  has never smoked. she has never used smokeless tobacco. She reports that she does not drink alcohol or use drugs.  Past Medical History:  Diagnosis Date  . Adenomatous colon polyp 2014   repeat colon in 5 years  . Bilateral cataracts   . Chest pain    2D ECHO, 05/29/2011 -EF >55%, normal; MYOVIEW, 05/29/2011 - normal  . Depression   . Diabetes ([place])   . Fracture, foot june 2014   left  . Glaucoma   . Hyperlipidemia   . Hypertension   . Lichen sclerosus et atrophicus of the vulva 05/2014  .  Neuropathy    legs  . PMB (postmenopausal bleeding)     Past Surgical History:  Procedure Laterality Date  . CARPAL TUNNEL RELEASE Bilateral   . CATARACT EXTRACTION W/ INTRAOCULAR LENS IMPLANT Bilateral 06/2015  . CESAREAN SECTION  1974  . CHOLECYSTECTOMY  1969  . ENDOMETRIAL BIOPSY  09-30-97   Dr Ricard Dillon  . HYSTEROSCOPY  02-10-97   D&C, polyp--focal hyperplasia w/o atypia  . TUBAL LIGATION Bilateral     Current Outpatient Medications  Medication Sig Dispense Refill  . ACCU-CHEK AVIVA PLUS test strip     . ALPRAZolam (XANAX) 0.25 MG tablet Take 0.25 mg by mouth every 8 (eight) hours.    Marland Kitchen amLODipine (NORVASC) 5 MG tablet TAKE 1 AND 1/2 TABLET BY MOUTH DAILY 45 tablet 6  . chlorthalidone (HYGROTON) 25 MG tablet TAKE 1/2 TABLET BY MOUTH DAILY 45 tablet 3  . clobetasol ointment (TEMOVATE) 0.05 % Apply topically 2 (two) times a week. Use twice weekly as needed to control symptoms. 30 g 0  . DULoxetine (CYMBALTA) 30 MG capsule Take 30 mg by mouth daily.     Marland Kitchen HUMALOG 100 UNIT/ML injection Insulin pump    . HYDROcodone-acetaminophen (NORCO/VICODIN) 5-325 MG per tablet Take 1 tablet by mouth as needed.    . Insulin Human (INSULIN PUMP) 100 unit/ml SOLN Inject into the skin.    Marland Kitchen  latanoprost (XALATAN) 0.005 % ophthalmic solution Place 1 drop into both eyes at bedtime.    Marland Kitchen PREVIDENT 5000 ENAMEL PROTECT 1.1-5 % PSTE See admin instructions.  5  . rosuvastatin (CRESTOR) 20 MG tablet Take 20 mg daily by mouth.  11  . traZODone (DESYREL) 50 MG tablet Take 25 mg by mouth at bedtime.      No current facility-administered medications for this visit.     Family History  Problem Relation Age of Onset  . Cancer Mother        Lung cancer  . Stroke Father 40  . Heart disease Father   . Hypertension Father   . Cancer Brother        Liver cancer  . Cancer Maternal Grandmother        Colon cancer  . Colon cancer Maternal Grandmother 46  . Heart attack Maternal Grandfather   . Heart  attack Paternal Grandmother   . Alzheimer's disease Paternal Grandfather   . Heart disease Brother 60    ROS:  Pertinent items are noted in HPI.  Otherwise, a comprehensive ROS was negative.  Exam:   BP (!) 142/72 (BP Location: Right Arm, Patient Position: Sitting, Cuff Size: Normal)   Pulse 80   Resp 14   Ht 5\' 2"  (1.575 m)   Wt 169 lb (76.7 kg)   LMP 08/06/1999 (Approximate)   BMI 30.91 kg/m     General appearance: alert, cooperative and appears stated age Head: Normocephalic, without obvious abnormality, atraumatic Neck: no adenopathy, supple, symmetrical, trachea midline and thyroid normal to inspection and palpation Lungs: clear to auscultation bilaterally Breasts: normal appearance, no masses or tenderness, No nipple retraction or dimpling, No nipple discharge or bleeding, No axillary or supraclavicular adenopathy Heart: regular rate and rhythm Abdomen: soft, non-tender; no masses, no organomegaly Extremities: extremities normal, atraumatic, no cyanosis or edema Skin: Skin color, texture, turgor normal. No rashes or lesions Lymph nodes: Cervical, supraclavicular, and axillary nodes normal. No abnormal inguinal nodes palpated Neurologic: Grossly normal  Pelvic: External genitalia: erythema of bilateral labia majora.  Clitoral enlargement with retraction of clitoral hood.              Urethra:  normal appearing urethra with no masses, tenderness or lesions              Bartholins and Skenes: normal                 Vagina: normal appearing vagina with normal color and discharge, no lesions              Cervix: no lesions              Pap taken: Yes.   Bimanual Exam:  Uterus:  normal size, contour, position, consistency, mobility, non-tender              Adnexa: no mass, fullness, tenderness              Rectal exam: Yes.  .  Confirms.              Anus:  normal sphincter tone, no lesions  Chaperone was present for exam.  Assessment:   Well woman visit with normal  exam. Lichen sclerosus.  It looks like the patient may be using too much of the clobetasol ointment.  Plan: Mammogram screening discussed. Recommended self breast awareness. Pap and HR HPV as above. Guidelines for Calcium, Vitamin D, regular exercise program including cardiovascular and weight bearing exercise. She will  stop using clobetasol regularly and use it only prn.  Her prn regimen will be twice a day for 2 weeks as needed. She does not need a refill. Kegel's recommended. Follow up annually and prn.    After visit summary provided.

## 2017-06-18 NOTE — Patient Instructions (Signed)

## 2017-06-19 LAB — CYTOLOGY - PAP: DIAGNOSIS: NEGATIVE

## 2017-11-30 ENCOUNTER — Other Ambulatory Visit: Payer: Self-pay | Admitting: Internal Medicine

## 2017-12-01 NOTE — Telephone Encounter (Signed)
REFILL 

## 2017-12-02 ENCOUNTER — Ambulatory Visit: Payer: Medicare Other | Admitting: Internal Medicine

## 2017-12-02 ENCOUNTER — Encounter: Payer: Self-pay | Admitting: Internal Medicine

## 2017-12-02 VITALS — BP 160/62 | HR 72 | Ht 62.0 in | Wt 167.2 lb

## 2017-12-02 DIAGNOSIS — I350 Nonrheumatic aortic (valve) stenosis: Secondary | ICD-10-CM | POA: Diagnosis not present

## 2017-12-02 DIAGNOSIS — I2510 Atherosclerotic heart disease of native coronary artery without angina pectoris: Secondary | ICD-10-CM

## 2017-12-02 DIAGNOSIS — I10 Essential (primary) hypertension: Secondary | ICD-10-CM | POA: Diagnosis not present

## 2017-12-02 DIAGNOSIS — E785 Hyperlipidemia, unspecified: Secondary | ICD-10-CM | POA: Diagnosis not present

## 2017-12-02 MED ORDER — CHLORTHALIDONE 25 MG PO TABS: 25.0000 mg | ORAL_TABLET | Freq: Every day | ORAL | 3 refills | Status: DC

## 2017-12-02 MED ORDER — EZETIMIBE 10 MG PO TABS: 10.0000 mg | ORAL_TABLET | Freq: Every day | ORAL | 3 refills | Status: DC

## 2017-12-02 MED ORDER — AMLODIPINE BESYLATE 10 MG PO TABS: 10.0000 mg | ORAL_TABLET | Freq: Every day | ORAL | 3 refills | Status: DC

## 2017-12-02 NOTE — Progress Notes (Signed)
OFFICE NOTE  Chief Complaint:  No complaints  Primary Care Physician: Burnard Bunting, MD  HPI:  Kelsey Pierce  is a 72 year old female with a history of epigastric pain, a systolic murmur, which on echo there was only trace amount of MR and mild TR. She also had mild concentric LVH and left atrial enlargement with a normal LV systolic function. There was not  impaired diastolic dysfunction. I have talked about some of the signs of hypertensive heart disease and felt that we should treat her more aggressively for her blood pressure and recommended starting lisinopril 20 mg daily. She has since followed up with Altha Harm, our clinical pharmacist, who had added 5 mg of amlodipine for blood pressures that remained elevated. I did review a two month blood pressure chart from her, which shows very good control of her blood pressure between 825 to 053 systolic. Heart rate has ranged basically in the 60s and low 70s.   Kelsey Pierce returns today for followup. Symptomatically, she is doing very well. Denies any chest pain, worsening shortness of breath, or any associated problems. She is overdue for an echocardiogram to evaluate for change in her valvular heart disease.  05/20/2016  Kelsey Pierce was seen today for follow-up. At her last office visit I repeated an echocardiogram as she had a louder systolic murmur. LVEF was 65-70% with grade 1 diastolic dysfunction and very mild aortic stenosis with a mean gradient of 8 mmHg. There was a questionable mobile density in the right atrium. A CT angiogram was recommended. This was performed on 06/19/2015. No mobile density was noted, however she was found to have coronary artery disease. Specifically her coronary artery calcium score was 678. There was moderate coronary disease in the proximal LAD and RCA as well as mild concentric LVH. She was asymptomatic the time and no further testing was undertaken however she reports over the past several months  she's become progressively more fatigue, particularly with exertion. She also has less energy overall and does get short of breath doing things like raking leaves. She denies any chest pain. She reports poor sleep at night however this is a chronic problem.  07/04/2016  Kelsey Pierce was seen back today in follow-up. She underwent a nuclear stress test which is negative for ischemia. She notes an improvement in her fatigue, particularly after discontinuing her lisinopril. She was found to have some acute renal insufficiency and hyperkalemia. The lisinopril was stopped to allow for this and she says that she feels better. It may be whatever caused her to have some renal dysfunction was the culprit for her symptoms. I subsequently increased her amlodipine to 10 mg to compensate for her blood pressure change although blood pressure is mildly elevated today and she notes more leg swelling.  12/16/2016  Mrs. Kamer was seen today in follow-up. Overall seems to be doing pretty well. She denies any worsening shortness of breath. She reports walking regularly without symptoms of chest pain or dyspnea. Blood pressure control is much better. Initially was 156/76 but a recheck blood pressure came down to 130/56. She's not had any significant swelling. She does report she takes chlorthalidone at night. She gets up usually once a night to urinate and this may be improved by taking the medicine the morning. Overall her morning blood pressures around 976 systolic. She does have mild aortic stenosis by echo in 2016. A stress test last year showed no ischemia with an EF of 68%.  12/02/2017  Kelsey Pierce returns today  for follow-up.  Overall she is without complaints.  She brought a list of blood pressures from home which indicate generally her blood pressure in the 229N and 989Q systolic.  At times she has been up to 165.  I do not see any blood pressures in the 120 range.  Today her blood pressure in the office was 160/62.  She  had labs with Dr. Reynaldo Minium last in June 2018.  Her total cholesterol is 175, HDL 71, LDL 90 and triglycerides 68.  Given her history of diabetes with cardiovascular risk, her goal LDL should be less than 70.  She is currently on rosuvastatin 20 mg daily.  PMHx:  Past Medical History:  Diagnosis Date  . Adenomatous colon polyp 2014   repeat colon in 5 years  . Bilateral cataracts   . Chest pain    2D ECHO, 05/29/2011 -EF >55%, normal; MYOVIEW, 05/29/2011 - normal  . Depression   . Diabetes ([place])   . Fracture, foot june 2014   left  . Glaucoma   . Hyperlipidemia   . Hypertension   . Lichen sclerosus et atrophicus of the vulva 05/2014  . Neuropathy    legs  . PMB (postmenopausal bleeding)     Past Surgical History:  Procedure Laterality Date  . CARPAL TUNNEL RELEASE Bilateral   . CATARACT EXTRACTION W/ INTRAOCULAR LENS IMPLANT Bilateral 06/2015  . CESAREAN SECTION  1974  . CHOLECYSTECTOMY  1969  . ENDOMETRIAL BIOPSY  09-30-97   Dr Ricard Dillon  . HYSTEROSCOPY  02-10-97   D&C, polyp--focal hyperplasia w/o atypia  . TUBAL LIGATION Bilateral     FAMHx:  Family History  Problem Relation Age of Onset  . Cancer Mother        Lung cancer  . Stroke Father 38  . Heart disease Father   . Hypertension Father   . Cancer Brother        Liver cancer  . Cancer Maternal Grandmother        Colon cancer  . Colon cancer Maternal Grandmother 57  . Heart attack Maternal Grandfather   . Heart attack Paternal Grandmother   . Alzheimer's disease Paternal Grandfather   . Heart disease Brother 10    SOCHx:   reports that she has never smoked. She has never used smokeless tobacco. She reports that she does not drink alcohol or use drugs.  ALLERGIES:  Allergies  Allergen Reactions  . Erythromycin     Abdominal pain  . Lisinopril Other (See Comments)    Stopped due to acute kidney injury and hyperkalemia    ROS: Pertinent items noted in HPI and remainder of comprehensive ROS  otherwise negative.  HOME MEDS: Current Outpatient Medications  Medication Sig Dispense Refill  . ACCU-CHEK AVIVA PLUS test strip     . ALPRAZolam (XANAX) 0.25 MG tablet Take 0.25 mg by mouth every 8 (eight) hours.    Marland Kitchen amLODipine (NORVASC) 5 MG tablet TAKE 1 AND 1/2 TABLET BY MOUTH DAILY 45 tablet 1  . chlorthalidone (HYGROTON) 25 MG tablet TAKE 1/2 TABLET BY MOUTH DAILY 45 tablet 3  . clobetasol ointment (TEMOVATE) 0.05 % Apply topically 2 (two) times a week. Use twice weekly as needed to control symptoms. 30 g 0  . DULoxetine (CYMBALTA) 30 MG capsule Take 30 mg by mouth daily.     Marland Kitchen HUMALOG 100 UNIT/ML injection Insulin pump    . HYDROcodone-acetaminophen (NORCO/VICODIN) 5-325 MG per tablet Take 1 tablet by mouth as needed.    Marland Kitchen  Insulin Human (INSULIN PUMP) 100 unit/ml SOLN Inject into the skin.    Marland Kitchen latanoprost (XALATAN) 0.005 % ophthalmic solution Place 1 drop into both eyes at bedtime.    Marland Kitchen PREVIDENT 5000 ENAMEL PROTECT 1.1-5 % PSTE See admin instructions.  5  . rosuvastatin (CRESTOR) 20 MG tablet Take 20 mg daily by mouth.  11  . traZODone (DESYREL) 50 MG tablet Take 25 mg by mouth at bedtime.      No current facility-administered medications for this visit.     LABS/IMAGING: No results found for this or any previous visit (from the past 48 hour(s)). No results found.  VITALS: BP (!) 160/62   Pulse 72   Ht 5\' 2"  (1.575 m)   Wt 167 lb 3.2 oz (75.8 kg)   LMP 08/06/1999 (Approximate)   BMI 30.58 kg/m   EXAM: General appearance: alert and no distress Neck: no carotid bruit, no JVD and thyroid not enlarged, symmetric, no tenderness/mass/nodules Lungs: clear to auscultation bilaterally Heart: regular rate and rhythm, S1, S2 normal and systolic murmur: early systolic 2/6, crescendo at 2nd right intercostal space Abdomen: soft, non-tender; bowel sounds normal; no masses,  no organomegaly Extremities: edema trace bilateral pedal edema Pulses: 2+ and symmetric Skin: Skin  color, texture, turgor normal. No rashes or lesions Neurologic: Grossly normal Psych: Pleasant  EKG: Normal sinus rhythm at 72-personally reviewed  ASSESSMENT: 1. History of fatigue and dyspnea - low risk stress test, no ischemia (06/2016) 2. Coronary artery disease with moderate proximal LAD and RCA stenosis and a coronary calcium score of 678 (06/2015) - by CT coronary angiography 3. Hypertension-controlled 4. Anxiety  5. Palpitations - resolved 6. Insulin-dependent diabetes 7. Dyslipidemia 8. Depression 9. Murmur (mild aortic stenosis-05/2015)  PLAN: 1.   Mrs. Kindall healing well and is asymptomatic however could be better medically optimized.  Blood pressure is running consistently elevated both at home and in the office.  I recommend repeated 10 mg daily and her chlorthalidone to 25 mg daily.  This should help offset some lower extremity edema and help get her blood pressure up to goal.  In addition her LDL cholesterol is not at goal given diabetes and moderate coronary artery disease by coronary artery CT angiography in 2016.  She will need aggressive lipid management.  I recommend adding ezetimibe 10 mg daily to her regimen and she should have repeat cholesterol testing during her annual follow-up with her primary care provider this summer.  With me annually or sooner as necessary.  Pixie Casino, MD, [facility], [place] Director of the [facility] Lipid Disorders &  Cardiovascular [facility] Diplomate of the American Board of Clinical Lipidology Attending Cardiologist  Direct Dial: [phone]  Fax: [phone]  Website:  [email or web address] 12/02/2017, 2:55 PM

## 2017-12-02 NOTE — Patient Instructions (Addendum)
Medication Instructions:   INCREASE amlodipine to 10mg  daily INCREASE chlorthalidone to 25mg  daily START zetia 10mg  daily  Labwork:  LIPID PANEL with PCP   Testing/Procedures:  Echocardiogram @ [address] - [address]rd Floor  Follow-Up:  ONE YEAR with Dr. Debara Pickett -- we will send a notice in [place] prior to when your next visit is due -- please call our office when you receive this notice to schedule an appointment   If you need a refill on your cardiac medications before your next appointment, please call your pharmacy.  Any Other Special Instructions Will Be Listed Below (If Applicable).

## 2017-12-09 ENCOUNTER — Other Ambulatory Visit: Payer: Self-pay

## 2017-12-09 ENCOUNTER — Ambulatory Visit (HOSPITAL_COMMUNITY): Payer: Medicare Other | Attending: Cardiovascular Disease

## 2017-12-09 DIAGNOSIS — I350 Nonrheumatic aortic (valve) stenosis: Secondary | ICD-10-CM

## 2017-12-09 DIAGNOSIS — I051 Rheumatic mitral insufficiency: Secondary | ICD-10-CM | POA: Insufficient documentation

## 2017-12-09 DIAGNOSIS — I420 Dilated cardiomyopathy: Secondary | ICD-10-CM | POA: Insufficient documentation

## 2017-12-09 DIAGNOSIS — I5030 Unspecified diastolic (congestive) heart failure: Secondary | ICD-10-CM | POA: Diagnosis not present

## 2018-02-25 ENCOUNTER — Encounter: Payer: Self-pay | Admitting: Obstetrics and Gynecology

## 2018-03-12 ENCOUNTER — Encounter: Payer: Self-pay | Admitting: Internal Medicine

## 2018-04-07 ENCOUNTER — Other Ambulatory Visit: Payer: Self-pay | Admitting: Obstetrics and Gynecology

## 2018-04-07 NOTE — Telephone Encounter (Signed)
Medication refill request: Clobetasol Last AEX:  06/18/17 BS Next AEX: 07/15/18 Last MMG (if hormonal medication request): 01/26/18 BIRADS 2 benign Refill authorized: 11/04/16 #30g w/0 refills; today please advise

## 2018-05-01 ENCOUNTER — Telehealth: Payer: Self-pay | Admitting: Internal Medicine

## 2018-05-01 NOTE — Telephone Encounter (Signed)
Received call from patient she stated she has had 2 episodes of chest pressure,chest burning that radiates into back and back of arms,each episode lasted appox 5 min.No chest pressure at present.Appointment scheduled with Almyra Deforest PA 05/04/18 at 2:00 pm.Advised to go to ED if needed.

## 2018-05-01 NOTE — Telephone Encounter (Signed)
New Message:    Pt c/o of Chest Pain: STAT if CP now or developed within 24 hours  1. Are you having CP right now? No   2. Are you experiencing any other symptoms (ex. SOB, nausea, vomiting, sweating)? No   3. How long have you been experiencing CP? 1 day   4. Is your CP continuous or coming and going? Coming and Going  5. Have you taken Nitroglycerin? No ?

## 2018-05-02 ENCOUNTER — Inpatient Hospital Stay (HOSPITAL_COMMUNITY)
Admission: EM | Admit: 2018-05-02 | Discharge: 2018-05-05 | DRG: 247 | Disposition: A | Discharge status: Home / Self Care | Payer: Medicare Other | Attending: Internal Medicine | Admitting: Internal Medicine

## 2018-05-02 ENCOUNTER — Other Ambulatory Visit: Payer: Self-pay

## 2018-05-02 DIAGNOSIS — Z6830 Body mass index (BMI) 30.0-30.9, adult: Secondary | ICD-10-CM

## 2018-05-02 DIAGNOSIS — E119 Type 2 diabetes mellitus without complications: Secondary | ICD-10-CM

## 2018-05-02 DIAGNOSIS — E669 Obesity, unspecified: Secondary | ICD-10-CM | POA: Diagnosis present

## 2018-05-02 DIAGNOSIS — E785 Hyperlipidemia, unspecified: Secondary | ICD-10-CM | POA: Diagnosis present

## 2018-05-02 DIAGNOSIS — R9431 Abnormal electrocardiogram [ECG] [EKG]: Secondary | ICD-10-CM | POA: Diagnosis present

## 2018-05-02 DIAGNOSIS — Z79899 Other long term (current) drug therapy: Secondary | ICD-10-CM

## 2018-05-02 DIAGNOSIS — E10649 Type 1 diabetes mellitus with hypoglycemia without coma: Secondary | ICD-10-CM | POA: Diagnosis present

## 2018-05-02 DIAGNOSIS — I25110 Atherosclerotic heart disease of native coronary artery with unstable angina pectoris: Secondary | ICD-10-CM | POA: Diagnosis present

## 2018-05-02 DIAGNOSIS — I214 Non-ST elevation (NSTEMI) myocardial infarction: Secondary | ICD-10-CM | POA: Diagnosis not present

## 2018-05-02 DIAGNOSIS — M797 Fibromyalgia: Secondary | ICD-10-CM | POA: Diagnosis present

## 2018-05-02 DIAGNOSIS — E162 Hypoglycemia, unspecified: Secondary | ICD-10-CM

## 2018-05-02 DIAGNOSIS — I2510 Atherosclerotic heart disease of native coronary artery without angina pectoris: Secondary | ICD-10-CM | POA: Diagnosis present

## 2018-05-02 DIAGNOSIS — F329 Major depressive disorder, single episode, unspecified: Secondary | ICD-10-CM | POA: Diagnosis present

## 2018-05-02 DIAGNOSIS — F419 Anxiety disorder, unspecified: Secondary | ICD-10-CM | POA: Diagnosis present

## 2018-05-02 DIAGNOSIS — I10 Essential (primary) hypertension: Secondary | ICD-10-CM | POA: Diagnosis present

## 2018-05-02 DIAGNOSIS — Z888 Allergy status to other drugs, medicaments and biological substances status: Secondary | ICD-10-CM

## 2018-05-02 DIAGNOSIS — Z955 Presence of coronary angioplasty implant and graft: Secondary | ICD-10-CM

## 2018-05-02 DIAGNOSIS — R079 Chest pain, unspecified: Secondary | ICD-10-CM | POA: Diagnosis present

## 2018-05-02 DIAGNOSIS — Z794 Long term (current) use of insulin: Secondary | ICD-10-CM

## 2018-05-02 DIAGNOSIS — E1042 Type 1 diabetes mellitus with diabetic polyneuropathy: Secondary | ICD-10-CM | POA: Diagnosis present

## 2018-05-02 DIAGNOSIS — IMO0001 Reserved for inherently not codable concepts without codable children: Secondary | ICD-10-CM

## 2018-05-02 DIAGNOSIS — H409 Unspecified glaucoma: Secondary | ICD-10-CM | POA: Diagnosis present

## 2018-05-02 LAB — CBG MONITORING, ED
GLUCOSE-CAPILLARY: 219 mg/dL — AB (ref 70–99)
Glucose-Capillary: 221 mg/dL — ABNORMAL HIGH (ref 70–99)

## 2018-05-02 LAB — I-STAT TROPONIN, ED: TROPONIN I, POC: 0.02 ng/mL (ref 0.00–0.08)

## 2018-05-02 NOTE — ED Notes (Signed)
IV team in room  

## 2018-05-02 NOTE — ED Triage Notes (Signed)
Pt from home for CP since Thurs, worsening tonight. Originally radiating to backs of upper arms now radiating down L arm.  324 asprin given by [facility] Denies N/V?SOB, some dizziness not orthostatic.  Hx myocardial fibrosis, cardiac arrythmia Vitals for [facility] 123/72; Hr 80. RR 16 Pt is type 1 diabetic, has insulin pump. Hypoglycemic episode around 1800 reported to [facility], sugar as low as 32. Was given sugar pills and sugar was 182, then 292. 5 units insulin approx 2100

## 2018-05-02 NOTE — ED Provider Notes (Signed)
[facility] EMERGENCY DEPARTMENT Provider Note   CSN: [id number] Arrival date & time: 05/02/18  2145  History   Chief Complaint Chief Complaint  Patient presents with  . Chest Pain    HPI Kelsey Pierce is a 72 y.o. female.  HPI Patient with 2 complaints: recurring Chest pain, T1DM with hypoglycemia to 30s  Chest Pain: Substernal radiating directly through to back and occasionally to left elbow.  She feels this is usually exertional but has been increasing in frequency of occurrence and beginning to happen spontaneously.  It generally last ~64min at a time.  She hasn't been nauseous/vomiting and has no respiratory symptoms.  She says she follows with Dr. Burnadette Peter for cardiology and the nurse there told them to come in Monday when they called in to discuss symptoms.  The most resent event was while watching a soccer game after walking much farther than her baseline from the parking lot.  She notes she does have GERD but thinks this is a new and discernable difference from her GERD discomfort.  She notes increasing edema in her legs but no orthopnea, she thinks the edema is related to amlodipine and states Dr. Burnadette Peter gave her a diuretic to counteract the amlodipine swelling.  She was taken off of lisinopril for renal concerns  Hypoglycemia: Patient has had multiple hypoglycemic events but none in >82yr.  Her daughter had called to ask if she wanted to go to dinner and when mom didn't pick up she went over to check on her and found her altered and with a CBG in the 30s, she seemed able to swallow food if placed in her mouth so she was given sugar pills and as she recovered she also ate some blue berry cereal.  EMS was called and enroute CBG was 290s so they administered 5u insulin at 2100.  CBG on ED exam was 221 ~1hr after insulin so additional was not ordered at that time.   Past Medical History:  Diagnosis Date  . Adenomatous colon polyp 2014   repeat colon in 5 years  .  Bilateral cataracts   . Chest pain    2D ECHO, 05/29/2011 -EF >55%, normal; MYOVIEW, 05/29/2011 - normal  . Depression   . Diabetes ([place])   . Fracture, foot june 2014   left  . Glaucoma   . Hyperlipidemia   . Hypertension   . Lichen sclerosus et atrophicus of the vulva 05/2014  . Neuropathy    legs  . PMB (postmenopausal bleeding)     Patient Active Problem List   Diagnosis Date Noted  . Essential hypertension 07/04/2016  . Hyperkalemia 07/04/2016  . Bilateral leg edema 07/04/2016  . Shortness of breath 05/20/2016  . Fatigue 05/20/2016  . Coronary artery disease involving native coronary artery of native heart without angina pectoris 05/20/2016  . Nonrheumatic aortic valve stenosis 05/20/2016  . Cardiac murmur 05/07/2015  . Aortic sclerosis 05/07/2015  . Palpitations 01/06/2013  . Anxiety 01/06/2013  . IDDM (insulin dependent diabetes mellitus) ([place]) 01/06/2013  . LVH (left ventricular hypertrophy) 01/06/2013  . Mitral regurgitation 01/06/2013  . Tricuspid regurgitation 01/06/2013  . Hyperlipidemia 01/06/2013    Past Surgical History:  Procedure Laterality Date  . CARPAL TUNNEL RELEASE Bilateral   . CATARACT EXTRACTION W/ INTRAOCULAR LENS IMPLANT Bilateral 06/2015  . CESAREAN SECTION  1974  . CHOLECYSTECTOMY  1969  . ENDOMETRIAL BIOPSY  09-30-97   Dr Ricard Dillon  . HYSTEROSCOPY  02-10-97   D&C, polyp--focal hyperplasia  w/o atypia  . TUBAL LIGATION Bilateral      OB History    Gravida  3   Para  2   Term  2   Preterm      AB  1   Living  2     SAB      TAB      Ectopic      Multiple      Live Births               Home Medications    Prior to Admission medications   Medication Sig Start Date End Date Taking? Authorizing Provider  ALPRAZolam (XANAX) 0.25 MG tablet Take 0.25 mg by mouth 3 (three) times daily as needed for anxiety.    Yes [provider]  amLODipine (NORVASC) 10 MG tablet Take 1 tablet (10 mg total) by mouth  daily. 12/02/17  Yes Hilty, Nadean Corwin, MD  chlorthalidone (HYGROTON) 25 MG tablet Take 1 tablet (25 mg total) by mouth daily. 12/02/17  Yes Hilty, Nadean Corwin, MD  Cholecalciferol (VITAMIN D) 2000 units tablet Take 2,000 Units by mouth daily.   Yes [provider]  clobetasol ointment (TEMOVATE) 0.05 % APPLY TOPICALLY 2 (TWO) TIMES A WEEK. USE TWICE WEEKLY AS NEEDED TO CONTROL SYMPTOMS. Patient taking differently: Apply 1 application topically 4 (four) times daily as needed (lichen sclerosus flares).  04/09/18  Yes Nunzio Cobbs, MD  DULoxetine (CYMBALTA) 30 MG capsule Take 30 mg by mouth daily.    Yes [provider]  ezetimibe (ZETIA) 10 MG tablet Take 1 tablet (10 mg total) by mouth daily. 12/02/17 08/01/18 Yes Hilty, Nadean Corwin, MD  HYDROcodone-acetaminophen (NORCO/VICODIN) 5-325 MG per tablet Take 1 tablet by mouth every 4 (four) hours as needed (pain).  04/20/14  Yes [provider]  Insulin Human (INSULIN PUMP) SOLN Inject into the skin See admin instructions. Use with Humalog   Yes [provider]  latanoprost (XALATAN) 0.005 % ophthalmic solution Place 1 drop into both eyes at bedtime.   Yes [provider]  rosuvastatin (CRESTOR) 20 MG tablet Take 20 mg by mouth at bedtime.  06/07/17  Yes [provider]  sodium fluoride (DENTA 5000 PLUS) 1.1 % CREA dental cream Place 1 application onto teeth at bedtime.   Yes [provider]  traZODone (DESYREL) 50 MG tablet Take 50 mg by mouth at bedtime.    Yes [provider]  ACCU-CHEK AVIVA PLUS test strip  03/03/13   [provider]    Family History Family History  Problem Relation Age of Onset  . Cancer Mother        Lung cancer  . Stroke Father 20  . Heart disease Father   . Hypertension Father   . Cancer Brother        Liver cancer  . Cancer Maternal Grandmother        Colon cancer  . Colon cancer Maternal Grandmother 5  . Heart attack Maternal  Grandfather   . Heart attack Paternal Grandmother   . Alzheimer's disease Paternal Grandfather   . Heart disease Brother 45    Social History Social History   Tobacco Use  . Smoking status: Never Smoker  . Smokeless tobacco: Never Used  Substance Use Topics  . Alcohol use: No    Alcohol/week: 0.0 standard drinks  . Drug use: No     Allergies   Erythromycin and Lisinopril   Review of Systems Review of Systems  Constitutional: Positive for activity change and fatigue. Negative for appetite change, chills, diaphoresis and fever.  HENT: Negative for congestion, ear pain, sinus pain, sore throat, trouble swallowing and voice change.   Eyes: Negative for photophobia and pain.  Respiratory: Positive for chest tightness. Negative for apnea, cough and shortness of breath.   Cardiovascular: Positive for chest pain and leg swelling. Negative for palpitations.  Gastrointestinal: Negative for abdominal distention, abdominal pain, constipation, diarrhea, nausea and vomiting.  Genitourinary: Negative for dysuria.  Musculoskeletal: Positive for back pain. Negative for neck pain.       Back pain radiating from chest  Neurological: Positive for weakness. Negative for syncope, light-headedness, numbness and headaches.  Psychiatric/Behavioral: Negative for confusion.     Physical Exam Updated Vital Signs BP (!) 145/58   Pulse (!) 102   Temp 97.7 F (36.5 C)   Resp 18   Ht 5\' 2"  (1.575 m)   Wt 73.5 kg   LMP 08/06/1999 (Approximate)   SpO2 99%   BMI 29.63 kg/m   Physical Exam  Constitutional: She is oriented to person, place, and time. She appears well-developed and well-nourished.  Non-toxic appearance. She appears ill.  HENT:  Head: Normocephalic.  Neck: Neck supple. No tracheal deviation present.  Cardiovascular: Normal rate and normal pulses. Exam reveals no friction rub.  Murmur heard. Pulses:      Dorsalis pedis pulses are 2+ on the right side, and 2+ on the left side.    2+ pitting edema to legs bilaterally  Pulmonary/Chest: Effort normal and breath sounds normal. No stridor. No respiratory distress.  Neurological: She is alert and oriented to person, place, and time. No cranial nerve deficit.  Skin: Capillary refill takes less than 2 seconds.  Psychiatric: Her behavior is normal. Her mood appears anxious. She is not agitated.  Nursing note and vitals reviewed.    ED Treatments / Results  Labs (all labs ordered are listed, but only abnormal results are displayed) Labs Reviewed  BASIC METABOLIC PANEL - Abnormal; Notable for the following components:      Result Value   Sodium 134 (*)    Potassium 3.4 (*)    Glucose, Bld 246 (*)    All other components within normal limits  CBG MONITORING, ED - Abnormal; Notable for the following components:   Glucose-Capillary 221 (*)    All other components within normal limits  CBG MONITORING, ED - Abnormal; Notable for the following components:   Glucose-Capillary 219 (*)    All other components within normal limits  CBC  I-STAT TROPONIN, ED    EKG EKG Interpretation  Date/Time:  Saturday May 02 2018 22:55:07 EDT Ventricular Rate:  92 PR Interval:    QRS Duration: 72 QT Interval:  450 QTC Calculation: 557 R Axis:   35 Text Interpretation:  Normal sinus rhythm No significant change since last tracing 17 Dec 2000 Confirmed by Pattricia Boss [phone]) on 05/03/2018 12:43:21 AM   Radiology Dg Chest 2 View  Result Date: 05/03/2018 CLINICAL DATA:  72 year old female with chest pain. EXAM: CHEST - 2 VIEW COMPARISON:  None. FINDINGS: There are bibasilar linear and platelike atelectasis. There is no focal consolidation, pleural effusion, or pneumothorax. The cardiac silhouette is within normal limits. No acute osseous pathology. IMPRESSION: No active cardiopulmonary disease. Electronically Signed   By: Anner Crete M.D.   On: 05/03/2018 00:46    Procedures Procedures (including critical care  time)  Medications Ordered in ED Medications - No data to display  Initial Impression / Assessment and Plan / ED Course  I have reviewed the triage vital signs and the nursing notes.  Pertinent labs & imaging results that were available during my care of the patient were reviewed by me and considered in my medical decision making (see chart for details).   Patient had aspirin enroute via EMS.  CXR/ECG, troponin and basic labs ordered.  Heart score 6, ECG did not indicate stemi.  Initial CBG on ED exam was 221, we did not order insulin as 5u had been administered via EMS ~1hr earlier  Initial troponin 0.02, CBC/BMP unremarkable.  Consulted hospital service for chest pain obs/hypoglycemia observation and they have agreed to admit   Final Clinical Impressions(s) / ED Diagnoses   Final diagnoses:  Chest pain, unspecified type  Hypoglycemia    ED Discharge Orders    None       Sherene Sires, DO 05/03/18 1448    Pattricia Boss, MD 05/04/18 [phone]

## 2018-05-03 ENCOUNTER — Other Ambulatory Visit: Payer: Self-pay

## 2018-05-03 ENCOUNTER — Observation Stay (HOSPITAL_BASED_OUTPATIENT_CLINIC_OR_DEPARTMENT_OTHER): Payer: Medicare Other

## 2018-05-03 ENCOUNTER — Emergency Department (HOSPITAL_COMMUNITY): Payer: Medicare Other

## 2018-05-03 ENCOUNTER — Encounter (HOSPITAL_COMMUNITY): Payer: Self-pay | Admitting: Family Medicine

## 2018-05-03 DIAGNOSIS — I214 Non-ST elevation (NSTEMI) myocardial infarction: Secondary | ICD-10-CM | POA: Diagnosis not present

## 2018-05-03 DIAGNOSIS — I340 Nonrheumatic mitral (valve) insufficiency: Secondary | ICD-10-CM

## 2018-05-03 DIAGNOSIS — E119 Type 2 diabetes mellitus without complications: Secondary | ICD-10-CM | POA: Diagnosis not present

## 2018-05-03 DIAGNOSIS — I2510 Atherosclerotic heart disease of native coronary artery without angina pectoris: Secondary | ICD-10-CM

## 2018-05-03 DIAGNOSIS — R079 Chest pain, unspecified: Secondary | ICD-10-CM | POA: Diagnosis not present

## 2018-05-03 DIAGNOSIS — R9431 Abnormal electrocardiogram [ECG] [EKG]: Secondary | ICD-10-CM

## 2018-05-03 DIAGNOSIS — M797 Fibromyalgia: Secondary | ICD-10-CM

## 2018-05-03 DIAGNOSIS — I10 Essential (primary) hypertension: Secondary | ICD-10-CM | POA: Diagnosis not present

## 2018-05-03 LAB — BASIC METABOLIC PANEL
ANION GAP: 8 (ref 5–15)
BUN: 17 mg/dL (ref 8–23)
CALCIUM: 9.6 mg/dL (ref 8.9–10.3)
CO2: 28 mmol/L (ref 22–32)
CREATININE: 0.88 mg/dL (ref 0.44–1.00)
Chloride: 98 mmol/L (ref 98–111)
Glucose, Bld: 246 mg/dL — ABNORMAL HIGH (ref 70–99)
Potassium: 3.4 mmol/L — ABNORMAL LOW (ref 3.5–5.1)
SODIUM: 134 mmol/L — AB (ref 135–145)

## 2018-05-03 LAB — TROPONIN I
TROPONIN I: 0.12 ng/mL — AB (ref <0.03)
Troponin I: 0.23 ng/mL (ref <0.03)

## 2018-05-03 LAB — GLUCOSE, CAPILLARY
GLUCOSE-CAPILLARY: 220 mg/dL — AB (ref 70–99)
GLUCOSE-CAPILLARY: 160 mg/dL — AB (ref 70–99)
GLUCOSE-CAPILLARY: 137 mg/dL — AB (ref 70–99)
GLUCOSE-CAPILLARY: 345 mg/dL — AB (ref 70–99)
GLUCOSE-CAPILLARY: 70 mg/dL (ref 70–99)
Glucose-Capillary: 163 mg/dL — ABNORMAL HIGH (ref 70–99)
Glucose-Capillary: 173 mg/dL — ABNORMAL HIGH (ref 70–99)
Glucose-Capillary: 280 mg/dL — ABNORMAL HIGH (ref 70–99)

## 2018-05-03 LAB — CBC
HCT: 41.4 % (ref 36.0–46.0)
HEMOGLOBIN: 13.7 g/dL (ref 12.0–15.0)
MCH: 29 pg (ref 26.0–34.0)
MCHC: 33.1 g/dL (ref 30.0–36.0)
MCV: 87.7 fL (ref 78.0–100.0)
Platelets: 274 10*3/uL (ref 150–400)
RBC: 4.72 MIL/uL (ref 3.87–5.11)
RDW: 13.2 % (ref 11.5–15.5)
WBC: 10.4 10*3/uL (ref 4.0–10.5)

## 2018-05-03 LAB — ECHOCARDIOGRAM COMPLETE
Height: 62 in
WEIGHTICAEL: 2599.66 [oz_av]

## 2018-05-03 LAB — MAGNESIUM: Magnesium: 1.8 mg/dL (ref 1.7–2.4)

## 2018-05-03 LAB — HEPARIN LEVEL (UNFRACTIONATED): HEPARIN UNFRACTIONATED: 0.28 [IU]/mL — AB (ref 0.30–0.70)

## 2018-05-03 LAB — CK: Total CK: 69 U/L (ref 38–234)

## 2018-05-03 MED ORDER — NITROGLYCERIN 0.4 MG SL SUBL: 0.4000 mg | SUBLINGUAL_TABLET | SUBLINGUAL | Status: DC | PRN

## 2018-05-03 MED ORDER — ASPIRIN EC 81 MG PO TBEC
81.0000 mg | DELAYED_RELEASE_TABLET | Freq: Every day | ORAL | Status: DC
  Administered 2018-05-03 – 2018-05-05 (×3): 81 mg via ORAL
  Filled 2018-05-03 (×3): qty 1

## 2018-05-03 MED ORDER — AMLODIPINE BESYLATE 10 MG PO TABS
10.0000 mg | ORAL_TABLET | Freq: Every day | ORAL | Status: DC
  Administered 2018-05-03 – 2018-05-05 (×3): 10 mg via ORAL
  Filled 2018-05-03 (×3): qty 1

## 2018-05-03 MED ORDER — ACETAMINOPHEN 325 MG PO TABS: 650.0000 mg | ORAL_TABLET | ORAL | Status: DC | PRN

## 2018-05-03 MED ORDER — TRAZODONE HCL 50 MG PO TABS
50.0000 mg | ORAL_TABLET | Freq: Every day | ORAL | Status: DC
  Administered 2018-05-03 – 2018-05-04 (×3): 50 mg via ORAL
  Filled 2018-05-03 (×3): qty 1

## 2018-05-03 MED ORDER — ALPRAZOLAM 0.25 MG PO TABS: 0.2500 mg | ORAL_TABLET | Freq: Three times a day (TID) | ORAL | Status: DC | PRN

## 2018-05-03 MED ORDER — VITAMIN D 1000 UNITS PO TABS
2000.0000 [IU] | ORAL_TABLET | Freq: Every day | ORAL | Status: DC
  Administered 2018-05-03 – 2018-05-05 (×3): 2000 [IU] via ORAL
  Filled 2018-05-03 (×3): qty 2

## 2018-05-03 MED ORDER — ALPRAZOLAM 0.25 MG PO TABS
0.2500 mg | ORAL_TABLET | Freq: Three times a day (TID) | ORAL | Status: DC | PRN
  Administered 2018-05-03: 0.5 mg via ORAL
  Administered 2018-05-04 (×2): 0.25 mg via ORAL
  Filled 2018-05-03: qty 1
  Filled 2018-05-03 (×2): qty 2

## 2018-05-03 MED ORDER — MAGNESIUM SULFATE IN D5W 1-5 GM/100ML-% IV SOLN
1.0000 g | Freq: Once | INTRAVENOUS | Status: AC
  Administered 2018-05-03: 1 g via INTRAVENOUS
  Filled 2018-05-03: qty 100

## 2018-05-03 MED ORDER — MORPHINE SULFATE (PF) 2 MG/ML IV SOLN: 1.0000 mg | INTRAVENOUS | Status: DC | PRN

## 2018-05-03 MED ORDER — ONDANSETRON HCL 4 MG/2ML IJ SOLN: 4.0000 mg | Freq: Four times a day (QID) | INTRAMUSCULAR | Status: DC | PRN

## 2018-05-03 MED ORDER — HEPARIN (PORCINE) IN NACL 100-0.45 UNIT/ML-% IJ SOLN
900.0000 [IU]/h | INTRAMUSCULAR | Status: DC
  Administered 2018-05-03: 800 [IU]/h via INTRAVENOUS
  Administered 2018-05-04: 900 [IU]/h via INTRAVENOUS
  Filled 2018-05-03 (×2): qty 250

## 2018-05-03 MED ORDER — GI COCKTAIL ~~LOC~~: 30.0000 mL | Freq: Three times a day (TID) | ORAL | Status: DC | PRN

## 2018-05-03 MED ORDER — INSULIN PUMP
Freq: Three times a day (TID) | SUBCUTANEOUS | Status: DC
  Administered 2018-05-03: 8.6 via SUBCUTANEOUS
  Administered 2018-05-03: 08:00:00 via SUBCUTANEOUS
  Filled 2018-05-03: qty 1

## 2018-05-03 MED ORDER — HEPARIN BOLUS VIA INFUSION
4000.0000 [IU] | Freq: Once | INTRAVENOUS | Status: AC
  Administered 2018-05-03: 4000 [IU] via INTRAVENOUS
  Filled 2018-05-03: qty 4000

## 2018-05-03 MED ORDER — DULOXETINE HCL 30 MG PO CPEP
30.0000 mg | ORAL_CAPSULE | Freq: Every day | ORAL | Status: DC
  Administered 2018-05-03 – 2018-05-05 (×3): 30 mg via ORAL
  Filled 2018-05-03 (×5): qty 1

## 2018-05-03 MED ORDER — LATANOPROST 0.005 % OP SOLN
1.0000 [drp] | Freq: Every day | OPHTHALMIC | Status: DC
  Administered 2018-05-04: 1 [drp] via OPHTHALMIC
  Filled 2018-05-03 (×2): qty 2.5

## 2018-05-03 MED ORDER — POTASSIUM CHLORIDE CRYS ER 20 MEQ PO TBCR
20.0000 meq | EXTENDED_RELEASE_TABLET | Freq: Once | ORAL | Status: AC
  Administered 2018-05-03: 20 meq via ORAL
  Filled 2018-05-03: qty 1

## 2018-05-03 MED ORDER — INSULIN PUMP: SUBCUTANEOUS | Status: DC

## 2018-05-03 MED ORDER — METOPROLOL TARTRATE 12.5 MG HALF TABLET
12.5000 mg | ORAL_TABLET | Freq: Two times a day (BID) | ORAL | Status: DC
  Administered 2018-05-03 – 2018-05-05 (×5): 12.5 mg via ORAL
  Filled 2018-05-03 (×5): qty 1

## 2018-05-03 MED ORDER — SODIUM FLUORIDE 1.1 % DT CREA: 1.0000 "application " | TOPICAL_CREAM | Freq: Every day | DENTAL | Status: DC

## 2018-05-03 MED ORDER — ROSUVASTATIN CALCIUM 20 MG PO TABS: 20.0000 mg | ORAL_TABLET | Freq: Every day | ORAL | Status: DC

## 2018-05-03 MED ORDER — ROSUVASTATIN CALCIUM 20 MG PO TABS
40.0000 mg | ORAL_TABLET | Freq: Every day | ORAL | Status: DC
  Administered 2018-05-03 – 2018-05-04 (×2): 40 mg via ORAL
  Filled 2018-05-03 (×2): qty 2

## 2018-05-03 MED ORDER — EZETIMIBE 10 MG PO TABS
10.0000 mg | ORAL_TABLET | Freq: Every day | ORAL | Status: DC
  Administered 2018-05-03 – 2018-05-05 (×3): 10 mg via ORAL
  Filled 2018-05-03 (×3): qty 1

## 2018-05-03 MED ORDER — NITROGLYCERIN 0.4 MG SL SUBL
0.4000 mg | SUBLINGUAL_TABLET | SUBLINGUAL | Status: DC | PRN
  Administered 2018-05-04: 0.4 mg via SUBLINGUAL
  Filled 2018-05-03: qty 1

## 2018-05-03 MED ORDER — CHLORTHALIDONE 25 MG PO TABS
25.0000 mg | ORAL_TABLET | Freq: Every day | ORAL | Status: DC
  Filled 2018-05-03: qty 1

## 2018-05-03 MED ORDER — ENOXAPARIN SODIUM 40 MG/0.4ML ~~LOC~~ SOLN
40.0000 mg | SUBCUTANEOUS | Status: DC
  Filled 2018-05-03 (×2): qty 0.4

## 2018-05-03 NOTE — Progress Notes (Signed)
  Echocardiogram 2D Echocardiogram has been performed.  Myles Tavella T Jannis Atkins 05/03/2018, 11:15 AM

## 2018-05-03 NOTE — H&P (Signed)
History and Physical    Kelsey Pierce:[id number] DOB: 05/09/1946 DOA: 05/02/2018  PCP: Burnard Bunting, MD   Patient coming from: Home   Chief Complaint: Chest pain   HPI: Kelsey Pierce is a 72 y.o. female with medical history significant for insulin-dependent diabetes mellitus, fibromyalgia, coronary artery disease, hypertension, and anxiety now presenting to the emergency department for evaluation of intermittent chest pain.  Patient reports that she experienced a burning pain in the central chest shortly after eating breakfast on 05/01/2018.  There was some radiation towards both arms and towards her back, and symptoms resolved spontaneously over the course of the morning.  The same symptoms recurred the following day.  She has been doing some light activity about the house without any chest pain.  She denies associated shortness of breath, nausea, diaphoresis, and denies lower extremity swelling or tenderness.  She has not experienced these same symptoms in the chest previously, but has had similar pains in her mid and upper back that she attributes to fibromyalgia.  She also has some tenderness to the chest with palpation, and also tender in her mid and upper back, as well as shoulders which she reports to be chronic and part of her fibromyalgia. States that she had some mild transient burning pain in the chest when she raised her arms up for CXR in the ED. she received full dose aspirin prior to arrival in the ED.  ED Course: Upon arrival to the ED, patient is found to be afebrile, saturating well on room air, and with vitals otherwise stable.  EKG features a sinus rhythm with PVC and QTc interval of 557 ms.  Chest x-ray is negative for acute cardiopulmonary disease.  Chemistry panel is notable for glucose of 246 and potassium 3.4.  She remained hemodynamically stable in the ED with no apparent respiratory distress and no return in her chest discomfort.  Hospitalists were asked to observe  the patient on the telemetry unit for ongoing evaluation and management.  Review of Systems:  All other systems reviewed and apart from HPI, are negative.  Past Medical History:  Diagnosis Date  . Adenomatous colon polyp 2014   repeat colon in 5 years  . Bilateral cataracts   . Chest pain    2D ECHO, 05/29/2011 -EF >55%, normal; MYOVIEW, 05/29/2011 - normal  . Depression   . Diabetes ([place])   . Fracture, foot june 2014   left  . Glaucoma   . Hyperlipidemia   . Hypertension   . Lichen sclerosus et atrophicus of the vulva 05/2014  . Neuropathy    legs  . PMB (postmenopausal bleeding)     Past Surgical History:  Procedure Laterality Date  . CARPAL TUNNEL RELEASE Bilateral   . CATARACT EXTRACTION W/ INTRAOCULAR LENS IMPLANT Bilateral 06/2015  . CESAREAN SECTION  1974  . CHOLECYSTECTOMY  1969  . ENDOMETRIAL BIOPSY  09-30-97   Dr Ricard Dillon  . HYSTEROSCOPY  02-10-97   D&C, polyp--focal hyperplasia w/o atypia  . TUBAL LIGATION Bilateral      reports that she has never smoked. She has never used smokeless tobacco. She reports that she does not drink alcohol or use drugs.  Allergies  Allergen Reactions  . Erythromycin Other (See Comments)    Abdominal pain  . Lisinopril Other (See Comments)    Stopped due to acute kidney injury and hyperkalemia    Family History  Problem Relation Age of Onset  . Cancer Mother  Lung cancer  . Stroke Father 40  . Heart disease Father   . Hypertension Father   . Cancer Brother        Liver cancer  . Cancer Maternal Grandmother        Colon cancer  . Colon cancer Maternal Grandmother 40  . Heart attack Maternal Grandfather   . Heart attack Paternal Grandmother   . Alzheimer's disease Paternal Grandfather   . Heart disease Brother 56     Prior to Admission medications   Medication Sig Start Date End Date Taking? Authorizing Provider  ALPRAZolam (XANAX) 0.25 MG tablet Take 0.25 mg by mouth 3 (three) times daily as  needed for anxiety.    Yes [provider]  amLODipine (NORVASC) 10 MG tablet Take 1 tablet (10 mg total) by mouth daily. 12/02/17  Yes Hilty, Nadean Corwin, MD  chlorthalidone (HYGROTON) 25 MG tablet Take 1 tablet (25 mg total) by mouth daily. 12/02/17  Yes Hilty, Nadean Corwin, MD  Cholecalciferol (VITAMIN D) 2000 units tablet Take 2,000 Units by mouth daily.   Yes [provider]  clobetasol ointment (TEMOVATE) 0.05 % APPLY TOPICALLY 2 (TWO) TIMES A WEEK. USE TWICE WEEKLY AS NEEDED TO CONTROL SYMPTOMS. Patient taking differently: Apply 1 application topically 4 (four) times daily as needed (lichen sclerosus flares).  04/09/18  Yes Nunzio Cobbs, MD  DULoxetine (CYMBALTA) 30 MG capsule Take 30 mg by mouth daily.    Yes [provider]  ezetimibe (ZETIA) 10 MG tablet Take 1 tablet (10 mg total) by mouth daily. 12/02/17 08/01/18 Yes Hilty, Nadean Corwin, MD  HYDROcodone-acetaminophen (NORCO/VICODIN) 5-325 MG per tablet Take 1 tablet by mouth every 4 (four) hours as needed (pain).  04/20/14  Yes [provider]  Insulin Human (INSULIN PUMP) SOLN Inject into the skin See admin instructions. Use with Humalog   Yes [provider]  latanoprost (XALATAN) 0.005 % ophthalmic solution Place 1 drop into both eyes at bedtime.   Yes [provider]  rosuvastatin (CRESTOR) 20 MG tablet Take 20 mg by mouth at bedtime.  06/07/17  Yes [provider]  sodium fluoride (DENTA 5000 PLUS) 1.1 % CREA dental cream Place 1 application onto teeth at bedtime.   Yes [provider]  traZODone (DESYREL) 50 MG tablet Take 50 mg by mouth at bedtime.    Yes [provider]  ACCU-CHEK AVIVA PLUS test strip  03/03/13   [provider]    Physical Exam: Vitals:   05/02/18 2315 05/02/18 2330 05/02/18 2359 05/03/18 0045  BP: (!) 147/61 (!) 145/58    Pulse: 94 94 97 (!) 102  Resp: 13 15 15 18   Temp:      SpO2: 98% 97% 98% 99%  Weight:        Height:         Constitutional: NAD, calm  Eyes: PERTLA, lids and conjunctivae normal ENMT: Mucous membranes are moist. Posterior pharynx clear of any exudate or lesions.   Neck: normal, supple, no masses, no thyromegaly Respiratory: clear to auscultation bilaterally, no wheezing, no crackles. Normal respiratory effort.   Cardiovascular: S1 & S2 heard, regular rate and rhythm. No extremity edema.   Abdomen: No distension, no tenderness, soft. Bowel sounds normal.  Musculoskeletal: no clubbing / cyanosis. Tender anterior chest wall, and tenderness involving soft tissues of upper and mid-back without redness or swelling.   Skin: no significant rashes, lesions, ulcers. Warm, dry, well-perfused. Neurologic: CN 2-12 grossly intact. Sensation intact. Strength  5/5 in all 4 limbs.  Psychiatric: Alert and oriented x 3. Calm, cooperative.    Labs on Admission: I have personally reviewed following labs and imaging studies  CBC: Recent Labs  Lab 05/02/18 2357  WBC 10.4  HGB 13.7  HCT 41.4  MCV 87.7  PLT 401   Basic Metabolic Panel: Recent Labs  Lab 05/02/18 2357  NA 134*  K 3.4*  CL 98  CO2 28  GLUCOSE 246*  BUN 17  CREATININE 0.88  CALCIUM 9.6   GFR: Estimated Creatinine Clearance: 52.7 mL/min (by C-G formula based on SCr of 0.88 mg/dL). Liver Function Tests: No results for input(s): AST, ALT, ALKPHOS, BILITOT, PROT, ALBUMIN in the last 168 hours. No results for input(s): LIPASE, AMYLASE in the last 168 hours. No results for input(s): AMMONIA in the last 168 hours. Coagulation Profile: No results for input(s): INR, PROTIME in the last 168 hours. Cardiac Enzymes: No results for input(s): CKTOTAL, CKMB, CKMBINDEX, TROPONINI in the last 168 hours. BNP (last 3 results) No results for input(s): PROBNP in the last 8760 hours. HbA1C: No results for input(s): HGBA1C in the last 72 hours. CBG: Recent Labs  Lab 05/02/18 2208 05/02/18 2351  GLUCAP 221* 219*   Lipid  Profile: No results for input(s): CHOL, HDL, LDLCALC, TRIG, CHOLHDL, LDLDIRECT in the last 72 hours. Thyroid Function Tests: No results for input(s): TSH, T4TOTAL, FREET4, T3FREE, THYROIDAB in the last 72 hours. Anemia Panel: No results for input(s): VITAMINB12, FOLATE, FERRITIN, TIBC, IRON, RETICCTPCT in the last 72 hours. Urine analysis:    Component Value Date/Time   BILIRUBINUR n 06/05/2016 1130   PROTEINUR trace 06/05/2016 1130   UROBILINOGEN negative 06/05/2016 1130   NITRITE n 06/05/2016 1130   LEUKOCYTESUR Negative 06/05/2016 1130   Sepsis Labs: @LABRCNTIP (procalcitonin:4,lacticidven:4) )No results found for this or any previous visit (from the past 240 hour(s)).   Radiological Exams on Admission: Dg Chest 2 View  Result Date: 05/03/2018 CLINICAL DATA:  72 year old female with chest pain. EXAM: CHEST - 2 VIEW COMPARISON:  None. FINDINGS: There are bibasilar linear and platelike atelectasis. There is no focal consolidation, pleural effusion, or pneumothorax. The cardiac silhouette is within normal limits. No acute osseous pathology. IMPRESSION: No active cardiopulmonary disease. Electronically Signed   By: Anner Crete M.D.   On: 05/03/2018 00:46    EKG: Independently reviewed. Normal sinus rhythm, QTc 557 ms.    Assessment/Plan   1. Chest pain; CAD   - Presents with 2 days of intermittent chest pain and has hx of CAD  - Description suggests this may be of GI or MSK etiology - EKG with sinus rhythm, PVC, no acute ischemic features  - CXR unremarkable  - Troponin normal in ED  - She received ASA 324 mg with EMS  - Continue cardiac monitoring, obtain serial troponin measurements, repeat EKG, continue statin, trial GI cocktail   2. Insulin-dependent DM  - No A1c on file  - Check CBG's, continue insulin pump   3. Hypertension  - BP at goal  - Continue chlorthalidone and Norvasc   4. Fibromyalgia  - Continue Cymbalta   5. Prolonged QT interval  - QTc is 557 ms  on EKG in ED  - Replace potassium to 4, mag to 2, avoid offending medications, continue cardiac monitoring, and repeat EKG in am    DVT prophylaxis: Lovenox  Code Status: Full  Family Communication: Daughter updated at bedside Consults called: None Admission status: Observation     Timothy S Opyd,  MD [facility] Pager [phone]  If 7PM-7AM, please contact night-coverage [email or web address] Password [email or web address]  05/03/2018, 1:15 AM

## 2018-05-03 NOTE — Progress Notes (Signed)
ANTICOAGULATION CONSULT NOTE - Initial Consult  Pharmacy Consult for Heparin Indication: chest pain/ACS  Allergies  Allergen Reactions  . Erythromycin Other (See Comments)    Abdominal pain  . Lisinopril Other (See Comments)    Stopped due to acute kidney injury and hyperkalemia    Patient Measurements: Height: 5\' 2"  (157.5 cm) Weight: 162 lb 7.7 oz (73.7 kg) IBW/kg (Calculated) : 50.1 Heparin Dosing Weight: 65.9 kg  Vital Signs: Temp: 98.2 F (36.8 C) (09/29 0628) Temp Source: Oral (09/29 0628) BP: 123/62 (09/29 0628) Pulse Rate: 79 (09/29 0628)  Labs: Recent Labs    05/02/18 2357 05/03/18 0210 05/03/18 0706  HGB 13.7  --   --   HCT 41.4  --   --   PLT 274  --   --   CREATININE 0.88  --   --   CKTOTAL  --  69  --   TROPONINI  --   --  0.12*    Estimated Creatinine Clearance: 52.7 mL/min (by C-G formula based on SCr of 0.88 mg/dL).   Medical History: Past Medical History:  Diagnosis Date  . Adenomatous colon polyp 2014   repeat colon in 5 years  . Bilateral cataracts   . Chest pain    2D ECHO, 05/29/2011 -EF >55%, normal; MYOVIEW, 05/29/2011 - normal  . Depression   . Diabetes ([place])   . Fracture, foot june 2014   left  . Glaucoma   . Hyperlipidemia   . Hypertension   . Lichen sclerosus et atrophicus of the vulva 05/2014  . Neuropathy    legs  . PMB (postmenopausal bleeding)     Assessment: 72 YO F with PMH of DM, HTN, CAD, fibromyalgia presenting with chest pain. Pharmacy consulted to dose heparin. Pt not on anticoagulation PTA.   Scr 0.88, Hgb and plts stable, no reports of bleeding.  Goal of Therapy:  Heparin level 0.3-0.7 units/ml Monitor platelets by anticoagulation protocol: Yes   Plan:  Give 4000 units bolus x 1 Start heparin infusion at 800 units/hr Check anti-Xa level in 8 hours  Monitor daily CBC, heparin level, and s/sx bleeding   Juanell Fairly, PharmD PGY1 Pharmacy Resident Phone [phone] 05/03/2018 9:28 AM

## 2018-05-03 NOTE — Progress Notes (Signed)
[facility] for Heparin Indication: chest pain/ACS  Allergies  Allergen Reactions  . Erythromycin Other (See Comments)    Abdominal pain  . Lisinopril Other (See Comments)    Stopped due to acute kidney injury and hyperkalemia    Patient Measurements: Height: 5\' 2"  (157.5 cm) Weight: 162 lb 7.7 oz (73.7 kg) IBW/kg (Calculated) : 50.1 Heparin Dosing Weight: 65.9 kg  Vital Signs: Temp: 98.5 F (36.9 C) (09/29 1235) Temp Source: Oral (09/29 1235) BP: 123/59 (09/29 1235) Pulse Rate: 66 (09/29 1235)  Labs: Recent Labs    05/02/18 2357 05/03/18 0210 05/03/18 0706 05/03/18 1230 05/03/18 1836  HGB 13.7  --   --   --   --   HCT 41.4  --   --   --   --   PLT 274  --   --   --   --   HEPARINUNFRC  --   --   --   --  0.28*  CREATININE 0.88  --   --   --   --   CKTOTAL  --  69  --   --   --   TROPONINI  --   --  0.12* 0.23*  --     Estimated Creatinine Clearance: 52.7 mL/min (by C-G formula based on SCr of 0.88 mg/dL).   Medical History: Past Medical History:  Diagnosis Date  . Adenomatous colon polyp 2014   repeat colon in 5 years  . Bilateral cataracts   . Chest pain    2D ECHO, 05/29/2011 -EF >55%, normal; MYOVIEW, 05/29/2011 - normal  . Depression   . Diabetes ([place])   . Fracture, foot june 2014   left  . Glaucoma   . Hyperlipidemia   . Hypertension   . Lichen sclerosus et atrophicus of the vulva 05/2014  . Neuropathy    legs  . PMB (postmenopausal bleeding)     Assessment: 72 YO F with PMH of DM, HTN, CAD, fibromyalgia presenting with chest pain. Pharmacy consulted to dose heparin. Pt not on anticoagulation PTA.  -heparin level is 0.28 and below goal on 800 units/hr    Goal of Therapy:  Heparin level 0.3-0.7 units/ml Monitor platelets by anticoagulation protocol: Yes   Plan:  -Increase heparin to 900 units/hr -daily heparin level and CBC  Hildred Laser, PharmD Clinical Pharmacist Please check Amion for pharmacy  contact number

## 2018-05-03 NOTE — Progress Notes (Signed)
Patient's BS is 70; advised patient to turn off insulin pump until I speak with MD. Patient given snack.

## 2018-05-03 NOTE — Progress Notes (Signed)
Patient admitted after midnight, please see H&P.  Here with chest pain and elevated troponin-- plan is for heart cath tomm, heparin gtt started. Patient very anxious-- has xanax ordered Continue insulin pump -echo pending  Kelsey Bear DO

## 2018-05-03 NOTE — Consult Note (Addendum)
Cardiology Consultation:   Patient ID: Kelsey Pierce MRN: [id number]; DOB: 1945/11/26  Admit date: 05/02/2018 Date of Consult: 05/03/2018  Primary Care Provider: Burnard Bunting, MD Primary Cardiologist: Pixie Casino, MD    Patient Profile:   Kelsey Pierce is a 72 y.o. female with a hx of epigastric pain, systolic murmur-trace MR and mild TR, CAD known by elevated calcium score on CCTA, hypertension, hyperlipidemia, insulin-dependent diabetes, fibromyalgia and depression who is being seen today for the evaluation of chest pain at the request of Dr. Eliseo Squires.  History of Present Illness:   Kelsey Pierce was last seen in our office on 12/02/2017 by Dr. Debara Pickett at which time her blood pressure was elevated.  Amlodipine 10 mg daily and chlorthalidone 25 mg daily were added. Her LDL was not to goal of <70 for diabetes and cardiac risk. Zetia 10 mg was added.   A coronary CTA on 06/19/2015 showed a calcium score of 678 which is at the 89th percentile for age and sex.  There was noted moderate CAD in the proximal LAD and RCA.  She had a normal exercise nuclear stress test in 05/2016 with normal EF.  Echocardiogram in 12/22/2017 showed EF 60-65%, no regional wall motion abnormalities, grade 1 diastolic dysfunction, mild MR and trivial TR.  Kelsey Pierce lives alone and is fully independent doing her own housework, shopping, cooking.  She drives occasionally.  She has not had any exertional chest discomfort or shortness of breath with activities.  On Thursday after eating breakfast she developed a central chest burning that radiated to her back and both armpits.  She got up and walked around and her discomfort resolved spontaneously and 10-15 minutes.  She had no associated symptoms.  No nausea, shortness of breath, dizziness, diaphoresis.  She had no further discomfort on that day.  She again had chest burning on Friday morning after eating breakfast and no more of the rest of the day.  She later went  shopping with her daughter and had no symptoms at all.  Yesterday she had some chest burning after lunch.  She went to her grandsons soccer game without any symptoms.  She had repeated chest burning after supper last night and this is why she came to the hospital.  Currently she has some mild chest tenderness to palpation which she attributes to fibromyalgia.  No epigastric tenderness that I can elicit.  She has had epigastric pain in the past but she says this current discomfort does not feel similar.   Past Medical History:  Diagnosis Date  . Adenomatous colon polyp 2014   repeat colon in 5 years  . Bilateral cataracts   . Chest pain    2D ECHO, 05/29/2011 -EF >55%, normal; MYOVIEW, 05/29/2011 - normal  . Depression   . Diabetes ([place])   . Fracture, foot june 2014   left  . Glaucoma   . Hyperlipidemia   . Hypertension   . Lichen sclerosus et atrophicus of the vulva 05/2014  . Neuropathy    legs  . PMB (postmenopausal bleeding)     Past Surgical History:  Procedure Laterality Date  . CARPAL TUNNEL RELEASE Bilateral   . CATARACT EXTRACTION W/ INTRAOCULAR LENS IMPLANT Bilateral 06/2015  . CESAREAN SECTION  1974  . CHOLECYSTECTOMY  1969  . ENDOMETRIAL BIOPSY  09-30-97   Dr Ricard Dillon  . HYSTEROSCOPY  02-10-97   D&C, polyp--focal hyperplasia w/o atypia  . TUBAL LIGATION Bilateral      Home Medications:  Prior to Admission medications   Medication Sig Start Date End Date Taking? Authorizing Provider  ALPRAZolam (XANAX) 0.25 MG tablet Take 0.25 mg by mouth 3 (three) times daily as needed for anxiety.    Yes [provider]  amLODipine (NORVASC) 10 MG tablet Take 1 tablet (10 mg total) by mouth daily. 12/02/17  Yes Hilty, Nadean Corwin, MD  chlorthalidone (HYGROTON) 25 MG tablet Take 1 tablet (25 mg total) by mouth daily. 12/02/17  Yes Hilty, Nadean Corwin, MD  Cholecalciferol (VITAMIN D) 2000 units tablet Take 2,000 Units by mouth daily.   Yes [provider]    clobetasol ointment (TEMOVATE) 0.05 % APPLY TOPICALLY 2 (TWO) TIMES A WEEK. USE TWICE WEEKLY AS NEEDED TO CONTROL SYMPTOMS. Patient taking differently: Apply 1 application topically 4 (four) times daily as needed (lichen sclerosus flares).  04/09/18  Yes Nunzio Cobbs, MD  DULoxetine (CYMBALTA) 30 MG capsule Take 30 mg by mouth daily.    Yes [provider]  ezetimibe (ZETIA) 10 MG tablet Take 1 tablet (10 mg total) by mouth daily. 12/02/17 08/01/18 Yes Hilty, Nadean Corwin, MD  HYDROcodone-acetaminophen (NORCO/VICODIN) 5-325 MG per tablet Take 1 tablet by mouth every 4 (four) hours as needed (pain).  04/20/14  Yes [provider]  Insulin Human (INSULIN PUMP) SOLN Inject into the skin See admin instructions. Use with Humalog   Yes [provider]  latanoprost (XALATAN) 0.005 % ophthalmic solution Place 1 drop into both eyes at bedtime.   Yes [provider]  rosuvastatin (CRESTOR) 20 MG tablet Take 20 mg by mouth at bedtime.  06/07/17  Yes [provider]  sodium fluoride (DENTA 5000 PLUS) 1.1 % CREA dental cream Place 1 application onto teeth at bedtime.   Yes [provider]  traZODone (DESYREL) 50 MG tablet Take 50 mg by mouth at bedtime.    Yes [provider]  ACCU-CHEK AVIVA PLUS test strip  03/03/13   [provider]    Inpatient Medications: Scheduled Meds: . amLODipine  10 mg Oral Daily  . aspirin EC  81 mg Oral Daily  . chlorthalidone  25 mg Oral Daily  . cholecalciferol  2,000 Units Oral Daily  . DULoxetine  30 mg Oral Daily  . enoxaparin (LOVENOX) injection  40 mg Subcutaneous Q24H  . ezetimibe  10 mg Oral Daily  . insulin pump   Subcutaneous TID AC, HS, 0200  . latanoprost  1 drop Both Eyes QHS  . rosuvastatin  20 mg Oral q1800  . traZODone  50 mg Oral QHS   Continuous Infusions:  PRN Meds: acetaminophen, ALPRAZolam, gi cocktail, morphine injection, nitroGLYCERIN  Allergies:    Allergies   Allergen Reactions  . Erythromycin Other (See Comments)    Abdominal pain  . Lisinopril Other (See Comments)    Stopped due to acute kidney injury and hyperkalemia    Social History:   Social History   Socioeconomic History  . Marital status: Widowed    Spouse name: Not on file  . Number of children: 2  . Years of education: Not on file  . Highest education level: Not on file  Occupational History  . Not on file  Social Needs  . Financial resource strain: Not on file  . Food insecurity:    Worry: Not on file    Inability: Not on file  . Transportation needs:    Medical: Not on file    Non-medical: Not on file  Tobacco Use  .  Smoking status: Never Smoker  . Smokeless tobacco: Never Used  Substance and Sexual Activity  . Alcohol use: No    Alcohol/week: 0.0 standard drinks  . Drug use: No  . Sexual activity: Never    Partners: Male    Birth control/protection: Post-menopausal  Lifestyle  . Physical activity:    Days per week: Not on file    Minutes per session: Not on file  . Stress: Not on file  Relationships  . Social connections:    Talks on phone: Not on file    Gets together: Not on file    Attends religious service: Not on file    Active member of club or organization: Not on file    Attends meetings of clubs or organizations: Not on file    Relationship status: Not on file  . Intimate partner violence:    Fear of current or ex partner: Not on file    Emotionally abused: Not on file    Physically abused: Not on file    Forced sexual activity: Not on file  Other Topics Concern  . Not on file  Social History Narrative  . Not on file    Family History:    Family History  Problem Relation Age of Onset  . Cancer Mother        Lung cancer  . Stroke Father 51  . Heart disease Father   . Hypertension Father   . Cancer Brother        Liver cancer  . Cancer Maternal Grandmother        Colon cancer  . Colon cancer Maternal Grandmother 75  . Heart  attack Maternal Grandfather   . Heart attack Paternal Grandmother   . Alzheimer's disease Paternal Grandfather   . Heart disease Brother 60     ROS:  Please see the history of present illness.   All other ROS reviewed and negative.     Physical Exam/Data:   Vitals:   05/03/18 0045 05/03/18 0115 05/03/18 0143 05/03/18 0628  BP:   (!) 148/67 123/62  Pulse: (!) 102 94 95 79  Resp: 18 13 16 16   Temp:   97.9 F (36.6 C) 98.2 F (36.8 C)  TempSrc:   Oral Oral  SpO2: 99% 95% 98% 96%  Weight:   73.7 kg   Height:   5\' 2"  (1.575 m)     Intake/Output Summary (Last 24 hours) at 05/03/2018 0802 Last data filed at 05/03/2018 0403 Gross per 24 hour  Intake 100.52 ml  Output -  Net 100.52 ml   Filed Weights   05/02/18 2158 05/03/18 0143  Weight: 73.5 kg 73.7 kg   Body mass index is 29.72 kg/m.  General:  Well nourished, well developed, in no acute distress HEENT: normal Lymph: no adenopathy Neck: no JVD Endocrine:  No thryomegaly Vascular: No carotid bruits; FA pulses 2+ bilaterally without bruits  Cardiac:  normal S1, S2; RRR; 3/6 left chest systolic murmur Lungs:  clear to auscultation bilaterally, no wheezing, rhonchi or rales  Abd: soft, nontender, no hepatomegaly  Ext: no edema Musculoskeletal:  No deformities, BUE and BLE strength normal and equal Skin: warm and dry  Neuro:  CNs 2-12 intact, no focal abnormalities noted Psych:  Normal affect   EKG:  The EKG was personally reviewed and demonstrates: Sinus rhythm with PACs/PVCs, low voltage QRS Telemetry:  Telemetry was personally reviewed and demonstrates: This rhythm 60s-70s  Relevant CV Studies:  Echocardiogram 12/09/2017 Study Conclusions - Left  ventricle: The cavity size was normal. Systolic function was   normal. The estimated ejection fraction was in the range of 60%   to 65%. Wall motion was normal; there were no regional wall   motion abnormalities. Doppler parameters are consistent with   abnormal left  ventricular relaxation (grade 1 diastolic   dysfunction). Doppler parameters are consistent with   indeterminate ventricular filling pressure. - Aortic valve: Transvalvular velocity was within the normal range.   There was no stenosis. There was no regurgitation. - Mitral valve: Calcified annulus. Transvalvular velocity was   within the normal range. There was no evidence for stenosis.   There was mild regurgitation. - Left atrium: The atrium was mildly dilated. - Right ventricle: The cavity size was normal. Wall thickness was   normal. Systolic function was normal. - Atrial septum: No defect or patent foramen ovale was identified   by color flow Doppler. - Tricuspid valve: There was trivial regurgitation. - Pulmonary arteries: Systolic pressure was within the normal   range. PA peak pressure: 30 mm Hg (S).   Exercise nuclear stress test 05/30/2016 Study Highlights    The left ventricular ejection fraction is hyperdynamic (>65%).  Nuclear stress EF: 68%.  Blood pressure demonstrated a normal response to exercise.  There was no ST segment deviation noted during stress.  The study is normal.   Normal stress nuclear study with no ischemia or infarction; EF 68 with normal wall motion.    Coronary CTA 06/19/2015 Aorta:  Normal size. Aortic Valve:  Trileaflet.  No calcifications. Coronary Arteries:  Normal coronary origin with right dominance. Left main is a large vessel with no plaque. LM gives rise to LAD, ramus intermedius and LCX arteries. Proximal LAD has mild non-calcified plaque with associated stenosis 25-50%. Mid LAD has moderate predominantly calcified plaque with associated stenosis 50-69%. Distal LAD has no significant plaque. A rather small diagonal branch has mild plaque with associated stenosis 25-50% stenosis. Ramus intermedium is a medium size vessel with no significant stenosis. LCX artery is rather small and difficult is interpret but has no obvious  plaque. RCA is a large dominant vessel. The interpretation is significantly affected by motion in its proximal and mid portion. There is moderate mixed, predominantly calcified plaque in the proximal portion of RCA with associated stenosis 50-69%. Mid RCA is not interpretable. Distal RCA/PDA/PLA has no significant plaque. There is no mass seen in the right atrium. There is mild concentric IMPRESSION: 1. Coronary calcium score of 678. This was 64 percentile for age and sex matched control. 2. Normal coronary origin with right dominance. 3. There is moderate CAD in proximal LAD and RCA. Mid RCA is not interpretable due to motion. If the patient is asymptomatic an aggressive medical therapy is recommended. 4. No mass was seen within the right atrium. 5.  Mild concentric left ventricular hypertrophy. Ena Dawley Electronically Signed   By: Ena Dawley  Laboratory Data:  Chemistry Recent Labs  Lab 05/02/18 2357  NA 134*  K 3.4*  CL 98  CO2 28  GLUCOSE 246*  BUN 17  CREATININE 0.88  CALCIUM 9.6  GFRNONAA >60  GFRAA >60  ANIONGAP 8    No results for input(s): PROT, ALBUMIN, AST, ALT, ALKPHOS, BILITOT in the last 168 hours. Hematology Recent Labs  Lab 05/02/18 2357  WBC 10.4  RBC 4.72  HGB 13.7  HCT 41.4  MCV 87.7  MCH 29.0  MCHC 33.1  RDW 13.2  PLT 274   Cardiac EnzymesNo results  for input(s): TROPONINI in the last 168 hours.  Recent Labs  Lab 05/02/18 2346  TROPIPOC 0.02    BNPNo results for input(s): BNP, PROBNP in the last 168 hours.  DDimer No results for input(s): DDIMER in the last 168 hours.  Radiology/Studies:  Dg Chest 2 View  Result Date: 05/03/2018 CLINICAL DATA:  72 year old female with chest pain. EXAM: CHEST - 2 VIEW COMPARISON:  None. FINDINGS: There are bibasilar linear and platelike atelectasis. There is no focal consolidation, pleural effusion, or pneumothorax. The cardiac silhouette is within normal limits. No acute osseous  pathology. IMPRESSION: No active cardiopulmonary disease. Electronically Signed   By: Anner Crete M.D.   On: 05/03/2018 00:46    Assessment and Plan:   Chest pain -Patient with known prior CAD by coronary CTA on 06/19/2015 with calcium score of 678 which is at the 89th percentile for age and sex.  There was noted moderate CAD in the proximal LAD and RCA.  She had a normal nuclear stress test in 05/2016 and normal EF by echo in 12/2017. -The patient presented with 3 days of chest burning only after eating.  No associated symptoms and no exertional chest discomfort or shortness of breath with activity. -1 troponin was negative at 2346 last night, prone and just resulted this morning at 0.12 -Chest x-ray showed no active cardiopulmonary disease -EKG looks a little different than her prior EKGs in the office with low voltage and prolonged QT -The patient's symptoms are more consistent with GI nature.  She may be having some signs of diabetic changes such as gastroparesis since her symptoms are occurring after she eats. -With her known history of elevated calcium score, hypertension, hyperlipidemia and diabetes, and newly positive troponin this morning of 0.12 we will discuss possible cardiac catheterization with attending cardiologist  Hypertension -Home medications include amlodipine 10 mg daily, chlorthalidone 25 mg daily -Blood pressure was mildly elevated during the night, better this morning 123/62. -Continue current medications.    Hyperlipidemia -Goal LDL <70 given her CVD risk and diabetes -On Crestor 20 mg daily.  Zetia 10 mg daily was added in April for LDL greater than goal.  Labs are followed at her primary care, not available in epic.  She reports that her follow-up lipids in July showed LDL of 85.  Insulin-dependent diabetes -On insulin pump.  Followed by primary care.  A1c not available in epic     For questions or updates, please contact [facility] Please consult  [email or web address] for contact info under     Signed, Daune Perch, NP  05/03/2018 8:02 AM   As above, patient seen and examined.  Briefly she is a 72 year old female with past medical history of diabetes mellitus for approximately 50 years, hypertension, hyperlipidemia, prior elevated calcium score, depression for evaluation of non-ST elevation myocardial infarction.  Over the past 3 days patient has had intermittent substernal chest pressure/burning.  Seems to have occurred after eating.  Radiates to back and left arm.  Initial episode lasted approximately 5 minutes and resolve spontaneously.  Had an episode yesterday lasting 1 hour.  No associated symptoms.  Presently pain-free.  Admitted and cardiology asked to evaluate.  Potassium 3.4.  Hemoglobin 13.7.  Initial troponin 0 0.02 with follow-up 0.12.  Electrocardiogram shows sinus rhythm with low voltage and no ST changes.  1 non-ST elevation myocardial infarction-patient admitted with concerning chest pain and troponin minimally elevated.  She also has 50 years of diabetes mellitus and previous elevated calcium score on CTA.  Plan cardiac catheterization.  The risks and benefits including myocardial infarction, CVA and death discussed and she agrees to proceed.  We will treat with aspirin and add metoprolol 12.5 mg twice daily.  Add IV heparin.  Increase Crestor to 40 mg daily.  2 hypertension-we will add metoprolol as outlined above for non-ST elevation myocardial infarction.  Hold chlorthalidone prior to catheterization.  Follow blood pressure and adjust regimen as needed.  3 hyperlipidemia-given coronary artery disease would increase Crestor to 40 mg daily.  Check lipids and liver in 4 weeks.  4 diabetes mellitus-Per primary care.  5 systolic murmur noted on examination likely flow murmur.  Check echocardiogram.  Kirk Ruths, MD

## 2018-05-04 ENCOUNTER — Ambulatory Visit: Payer: Medicare Other | Admitting: Physician Assistant

## 2018-05-04 ENCOUNTER — Encounter (HOSPITAL_COMMUNITY)
Admission: EM | Disposition: A | Discharge status: Home / Self Care | Payer: Self-pay | Source: Home / Self Care | Attending: Internal Medicine

## 2018-05-04 ENCOUNTER — Encounter (HOSPITAL_COMMUNITY): Payer: Self-pay | Admitting: Interventional Cardiology

## 2018-05-04 DIAGNOSIS — E10649 Type 1 diabetes mellitus with hypoglycemia without coma: Secondary | ICD-10-CM | POA: Diagnosis present

## 2018-05-04 DIAGNOSIS — I10 Essential (primary) hypertension: Secondary | ICD-10-CM | POA: Diagnosis present

## 2018-05-04 DIAGNOSIS — F329 Major depressive disorder, single episode, unspecified: Secondary | ICD-10-CM | POA: Diagnosis present

## 2018-05-04 DIAGNOSIS — Z79899 Other long term (current) drug therapy: Secondary | ICD-10-CM | POA: Diagnosis not present

## 2018-05-04 DIAGNOSIS — E785 Hyperlipidemia, unspecified: Secondary | ICD-10-CM | POA: Diagnosis present

## 2018-05-04 DIAGNOSIS — Z6830 Body mass index (BMI) 30.0-30.9, adult: Secondary | ICD-10-CM | POA: Diagnosis not present

## 2018-05-04 DIAGNOSIS — R079 Chest pain, unspecified: Secondary | ICD-10-CM

## 2018-05-04 DIAGNOSIS — I200 Unstable angina: Secondary | ICD-10-CM | POA: Diagnosis not present

## 2018-05-04 DIAGNOSIS — E119 Type 2 diabetes mellitus without complications: Secondary | ICD-10-CM | POA: Diagnosis not present

## 2018-05-04 DIAGNOSIS — Z888 Allergy status to other drugs, medicaments and biological substances status: Secondary | ICD-10-CM | POA: Diagnosis not present

## 2018-05-04 DIAGNOSIS — H409 Unspecified glaucoma: Secondary | ICD-10-CM | POA: Diagnosis present

## 2018-05-04 DIAGNOSIS — I25110 Atherosclerotic heart disease of native coronary artery with unstable angina pectoris: Secondary | ICD-10-CM | POA: Diagnosis not present

## 2018-05-04 DIAGNOSIS — M797 Fibromyalgia: Secondary | ICD-10-CM | POA: Diagnosis present

## 2018-05-04 DIAGNOSIS — I214 Non-ST elevation (NSTEMI) myocardial infarction: Secondary | ICD-10-CM | POA: Diagnosis present

## 2018-05-04 DIAGNOSIS — F419 Anxiety disorder, unspecified: Secondary | ICD-10-CM | POA: Diagnosis present

## 2018-05-04 DIAGNOSIS — E162 Hypoglycemia, unspecified: Secondary | ICD-10-CM | POA: Diagnosis present

## 2018-05-04 DIAGNOSIS — Z794 Long term (current) use of insulin: Secondary | ICD-10-CM | POA: Diagnosis not present

## 2018-05-04 DIAGNOSIS — E669 Obesity, unspecified: Secondary | ICD-10-CM | POA: Diagnosis present

## 2018-05-04 DIAGNOSIS — E1042 Type 1 diabetes mellitus with diabetic polyneuropathy: Secondary | ICD-10-CM | POA: Diagnosis present

## 2018-05-04 HISTORY — PX: LEFT HEART CATH AND CORONARY ANGIOGRAPHY: CATH118249

## 2018-05-04 HISTORY — PX: CORONARY STENT INTERVENTION: CATH118234

## 2018-05-04 LAB — BASIC METABOLIC PANEL
Anion gap: 8 (ref 5–15)
BUN: 13 mg/dL (ref 8–23)
CALCIUM: 10 mg/dL (ref 8.9–10.3)
CHLORIDE: 102 mmol/L (ref 98–111)
CO2: 29 mmol/L (ref 22–32)
CREATININE: 0.92 mg/dL (ref 0.44–1.00)
GFR calc Af Amer: >60 mL/min (ref >60)
GFR calc non Af Amer: 60 mL/min — ABNORMAL LOW (ref >60)
Glucose, Bld: 211 mg/dL — ABNORMAL HIGH (ref 70–99)
Potassium: 3.1 mmol/L — ABNORMAL LOW (ref 3.5–5.1)
SODIUM: 139 mmol/L (ref 135–145)

## 2018-05-04 LAB — CBC
HEMATOCRIT: 43.9 % (ref 36.0–46.0)
Hemoglobin: 14.5 g/dL (ref 12.0–15.0)
MCH: 29 pg (ref 26.0–34.0)
MCHC: 33 g/dL (ref 30.0–36.0)
MCV: 87.8 fL (ref 78.0–100.0)
Platelets: 315 10*3/uL (ref 150–400)
RBC: 5 MIL/uL (ref 3.87–5.11)
RDW: 13.7 % (ref 11.5–15.5)
WBC: 9.8 10*3/uL (ref 4.0–10.5)

## 2018-05-04 LAB — POCT ACTIVATED CLOTTING TIME
ACTIVATED CLOTTING TIME: 224 s
Activated Clotting Time: 384 seconds
Activated Clotting Time: 428 seconds

## 2018-05-04 LAB — GLUCOSE, CAPILLARY
GLUCOSE-CAPILLARY: 176 mg/dL — AB (ref 70–99)
Glucose-Capillary: 200 mg/dL — ABNORMAL HIGH (ref 70–99)
Glucose-Capillary: 270 mg/dL — ABNORMAL HIGH (ref 70–99)
Glucose-Capillary: 288 mg/dL — ABNORMAL HIGH (ref 70–99)
Glucose-Capillary: 217 mg/dL — ABNORMAL HIGH (ref 70–99)
Glucose-Capillary: 228 mg/dL — ABNORMAL HIGH (ref 70–99)

## 2018-05-04 LAB — HEPARIN LEVEL (UNFRACTIONATED): Heparin Unfractionated: 0.43 IU/mL (ref 0.30–0.70)

## 2018-05-04 LAB — TROPONIN I: TROPONIN I: 0.09 ng/mL — AB (ref <0.03)

## 2018-05-04 SURGERY — LEFT HEART CATH AND CORONARY ANGIOGRAPHY
Anesthesia: LOCAL

## 2018-05-04 MED ORDER — HEPARIN SODIUM (PORCINE) 5000 UNIT/ML IJ SOLN
5000.0000 [IU] | Freq: Three times a day (TID) | INTRAMUSCULAR | Status: DC
  Administered 2018-05-05: 5000 [IU] via SUBCUTANEOUS
  Filled 2018-05-04: qty 1

## 2018-05-04 MED ORDER — SODIUM CHLORIDE 0.9 % WEIGHT BASED INFUSION: 1.0000 mL/kg/h | INTRAVENOUS | Status: DC

## 2018-05-04 MED ORDER — FENTANYL CITRATE (PF) 100 MCG/2ML IJ SOLN
INTRAMUSCULAR | Status: AC
  Filled 2018-05-04: qty 2

## 2018-05-04 MED ORDER — SODIUM CHLORIDE 0.9% FLUSH
3.0000 mL | Freq: Two times a day (BID) | INTRAVENOUS | Status: DC
  Administered 2018-05-04 – 2018-05-05 (×2): 3 mL via INTRAVENOUS

## 2018-05-04 MED ORDER — TICAGRELOR 90 MG PO TABS
ORAL_TABLET | ORAL | Status: AC
  Filled 2018-05-04: qty 2

## 2018-05-04 MED ORDER — SODIUM CHLORIDE 0.9 % WEIGHT BASED INFUSION: 3.0000 mL/kg/h | INTRAVENOUS | Status: DC

## 2018-05-04 MED ORDER — SODIUM CHLORIDE 0.9 % WEIGHT BASED INFUSION
1.0000 mL/kg/h | INTRAVENOUS | Status: AC
  Administered 2018-05-04 (×2): 1 mL/kg/h via INTRAVENOUS

## 2018-05-04 MED ORDER — SODIUM CHLORIDE 0.9 % IV SOLN: 250.0000 mL | INTRAVENOUS | Status: DC | PRN

## 2018-05-04 MED ORDER — POTASSIUM CHLORIDE CRYS ER 20 MEQ PO TBCR
60.0000 meq | EXTENDED_RELEASE_TABLET | Freq: Once | ORAL | Status: AC
  Administered 2018-05-04: 60 meq via ORAL
  Filled 2018-05-04: qty 3

## 2018-05-04 MED ORDER — SODIUM CHLORIDE 0.9% FLUSH: 3.0000 mL | INTRAVENOUS | Status: DC | PRN

## 2018-05-04 MED ORDER — NITROGLYCERIN 1 MG/10 ML FOR IR/CATH LAB
INTRA_ARTERIAL | Status: AC
  Filled 2018-05-04: qty 10

## 2018-05-04 MED ORDER — HEPARIN (PORCINE) IN NACL 1000-0.9 UT/500ML-% IV SOLN
INTRAVENOUS | Status: AC
  Filled 2018-05-04: qty 1000

## 2018-05-04 MED ORDER — VERAPAMIL HCL 2.5 MG/ML IV SOLN
INTRAVENOUS | Status: DC | PRN
  Administered 2018-05-04: 10 mL via INTRA_ARTERIAL

## 2018-05-04 MED ORDER — TICAGRELOR 90 MG PO TABS
ORAL_TABLET | ORAL | Status: DC | PRN
  Administered 2018-05-04: 180 mg via ORAL

## 2018-05-04 MED ORDER — ASPIRIN 81 MG PO CHEW: 81.0000 mg | CHEWABLE_TABLET | ORAL | Status: DC

## 2018-05-04 MED ORDER — LIDOCAINE HCL (PF) 1 % IJ SOLN
INTRAMUSCULAR | Status: AC
  Filled 2018-05-04: qty 30

## 2018-05-04 MED ORDER — ASPIRIN 81 MG PO CHEW: 81.0000 mg | CHEWABLE_TABLET | Freq: Every day | ORAL | Status: DC

## 2018-05-04 MED ORDER — ONDANSETRON HCL 4 MG/2ML IJ SOLN: 4.0000 mg | Freq: Four times a day (QID) | INTRAMUSCULAR | Status: DC | PRN

## 2018-05-04 MED ORDER — INSULIN PUMP
Freq: Three times a day (TID) | SUBCUTANEOUS | Status: DC
  Administered 2018-05-04: 18:00:00 4 via SUBCUTANEOUS
  Administered 2018-05-04 – 2018-05-05 (×2): via SUBCUTANEOUS
  Administered 2018-05-05: 07:00:00 2.7 via SUBCUTANEOUS
  Filled 2018-05-04: qty 1

## 2018-05-04 MED ORDER — ACETAMINOPHEN 325 MG PO TABS: 650.0000 mg | ORAL_TABLET | ORAL | Status: DC | PRN

## 2018-05-04 MED ORDER — HYDRALAZINE HCL 20 MG/ML IJ SOLN: 5.0000 mg | INTRAMUSCULAR | Status: AC | PRN

## 2018-05-04 MED ORDER — SODIUM CHLORIDE 0.9 % WEIGHT BASED INFUSION
3.0000 mL/kg/h | INTRAVENOUS | Status: DC
  Administered 2018-05-04: 3 mL/kg/h via INTRAVENOUS

## 2018-05-04 MED ORDER — LIDOCAINE HCL (PF) 1 % IJ SOLN
INTRAMUSCULAR | Status: DC | PRN
  Administered 2018-05-04: 2 mL

## 2018-05-04 MED ORDER — INSULIN PUMP
SUBCUTANEOUS | Status: DC
  Administered 2018-05-04: 4.8 via SUBCUTANEOUS
  Filled 2018-05-04: qty 1

## 2018-05-04 MED ORDER — MIDAZOLAM HCL 2 MG/2ML IJ SOLN
INTRAMUSCULAR | Status: AC
  Filled 2018-05-04: qty 2

## 2018-05-04 MED ORDER — INSULIN PUMP: Freq: Three times a day (TID) | SUBCUTANEOUS | Status: DC

## 2018-05-04 MED ORDER — MAGNESIUM SULFATE 2 GM/50ML IV SOLN
2.0000 g | Freq: Once | INTRAVENOUS | Status: AC
  Administered 2018-05-04: 2 g via INTRAVENOUS
  Filled 2018-05-04: qty 50

## 2018-05-04 MED ORDER — HEPARIN (PORCINE) IN NACL 1000-0.9 UT/500ML-% IV SOLN
INTRAVENOUS | Status: DC | PRN
  Administered 2018-05-04 (×2): 500 mL

## 2018-05-04 MED ORDER — HEPARIN (PORCINE) IN NACL 1000-0.9 UT/500ML-% IV SOLN
INTRAVENOUS | Status: AC
  Filled 2018-05-04: qty 500

## 2018-05-04 MED ORDER — FENTANYL CITRATE (PF) 100 MCG/2ML IJ SOLN
INTRAMUSCULAR | Status: DC | PRN
  Administered 2018-05-04 (×2): 25 ug via INTRAVENOUS

## 2018-05-04 MED ORDER — VERAPAMIL HCL 2.5 MG/ML IV SOLN
INTRAVENOUS | Status: AC
  Filled 2018-05-04: qty 2

## 2018-05-04 MED ORDER — MIDAZOLAM HCL 2 MG/2ML IJ SOLN
INTRAMUSCULAR | Status: DC | PRN
  Administered 2018-05-04 (×2): 1 mg via INTRAVENOUS

## 2018-05-04 MED ORDER — LABETALOL HCL 5 MG/ML IV SOLN: 10.0000 mg | INTRAVENOUS | Status: AC | PRN

## 2018-05-04 MED ORDER — IOHEXOL 350 MG/ML SOLN
INTRAVENOUS | Status: DC | PRN
  Administered 2018-05-04: 175 mL

## 2018-05-04 MED ORDER — TICAGRELOR 90 MG PO TABS
90.0000 mg | ORAL_TABLET | Freq: Two times a day (BID) | ORAL | Status: DC
  Administered 2018-05-05 (×2): 90 mg via ORAL
  Filled 2018-05-04 (×2): qty 1

## 2018-05-04 MED ORDER — HEPARIN SODIUM (PORCINE) 1000 UNIT/ML IJ SOLN
INTRAMUSCULAR | Status: DC | PRN
  Administered 2018-05-04: 2000 [IU] via INTRAVENOUS
  Administered 2018-05-04: 6000 [IU] via INTRAVENOUS
  Administered 2018-05-04: 4000 [IU] via INTRAVENOUS

## 2018-05-04 SURGICAL SUPPLY — 20 items
BALLN EMERGE MR 2.75X15 (BALLOONS) ×2
BALLN SAPPHIRE ~~LOC~~ 3.5X15 (BALLOONS) ×1 IMPLANT
BALLOON EMERGE MR 2.75X15 (BALLOONS) IMPLANT
CATH 5FR JL3.5 JR4 ANG PIG MP (CATHETERS) ×1 IMPLANT
CATH VISTA GUIDE 6FR JR4 (CATHETERS) ×1 IMPLANT
DEVICE RAD COMP TR BAND LRG (VASCULAR PRODUCTS) ×1 IMPLANT
GLIDESHEATH SLEND A-KIT 6F 22G (SHEATH) ×1 IMPLANT
GUIDEWIRE INQWIRE 1.5J.035X260 (WIRE) IMPLANT
INQWIRE 1.5J .035X260CM (WIRE) ×2
KIT ENCORE 26 ADVANTAGE (KITS) ×1 IMPLANT
KIT ESSENTIALS PG (KITS) ×1 IMPLANT
KIT HEART LEFT (KITS) ×2 IMPLANT
PACK CARDIAC CATHETERIZATION (CUSTOM PROCEDURE TRAY) ×2 IMPLANT
SHEATH PROBE COVER 6X72 (BAG) ×1 IMPLANT
STENT SIERRA 3.00 X 28 MM (Permanent Stent) ×1 IMPLANT
TRANSDUCER W/STOPCOCK (MISCELLANEOUS) ×2 IMPLANT
TUBING CIL FLEX 10 FLL-RA (TUBING) ×2 IMPLANT
WIRE ASAHI PROWATER 180CM (WIRE) ×1 IMPLANT
WIRE COUGAR XT STRL 190CM (WIRE) ×1 IMPLANT
WIRE HI TORQ VERSACORE-J 145CM (WIRE) ×1 IMPLANT

## 2018-05-04 NOTE — Progress Notes (Signed)
Progress Note    Kelsey Pierce  TLX:[id number] DOB: [birth year]  DOA: 05/02/2018 PCP: Burnard Bunting, MD    Brief Narrative:    Medical records reviewed and are as summarized below:  Kelsey Pierce is an 72 y.o. female with medical history significant for insulin-dependent diabetes mellitus, fibromyalgia, coronary artery disease, hypertension, and anxiety now presenting to the emergency department for evaluation of intermittent chest pain.  Patient reports that she experienced a burning pain in the central chest shortly after eating breakfast on 05/01/2018.  There was some radiation towards both arms and towards her back, and symptoms resolved spontaneously over the course of the morning.  The same symptoms recurred the following day.  She has been doing some light activity about the house without any chest pain.  She denies associated shortness of breath, nausea, diaphoresis, and denies lower extremity swelling or tenderness.  She has not experienced these same symptoms in the chest previously, but has had similar pains in her mid and upper back that she attributes to fibromyalgia.  She also has some tenderness to the chest with palpation, and also tender in her mid and upper back, as well as shoulders which she reports to be chronic and part of her fibromyalgia. States that she had some mild transient burning pain in the chest when she raised her arms up for CXR in the ED. she received full dose aspirin prior to arrival in the ED.  Assessment/Plan:   Principal Problem:   Chest pain Active Problems:   Anxiety   IDDM (insulin dependent diabetes mellitus) ([place])   Coronary artery disease involving native coronary artery of native heart without angina pectoris   Essential hypertension   Prolonged QT interval   Fibromyalgia  NSTEMI -s/p cath with stent placement:  High-grade culprit obstruction in the mid right coronary, 95%.  The culprit was treated with angioplasty and stenting  using a Sierra 28 x 3.0 mm stent postdilated to 3.5 mm at 15 atm reducing the stenosis to less than 10% with TIMI grade III flow.  Widely patent left main  50 to 70% mid LAD.  First diagonal contains 70% segmental ostial to proximal narrowing.  Circumflex artery is widely patent without significant obstructive disease.  Normal left ventricular function with EF 55 to 60%.  LVEDP is normal at 14 mmHg  RECOMMENDATIONS:   Aspirin and Brilinta for 12 months.  Aggressive risk factor modification: A1c less than 7, LDL less than 70, 150 minutes of moderate aerobic activity per week, target blood pressure 130/80 mmHg.   Insulin-dependent DM  - Check CBG's, continue insulin pump   Hypertension  - BP at goal  - Continue chlorthalidone and Norvasc    Fibromyalgia  - Continue Cymbalta   obesity Body mass index is 30.44 kg/m.   Family Communication/Anticipated D/C date and plan/Code Status   DVT prophylaxis: Lovenox ordered. Code Status: Full Code.  Family Communication:  Disposition Plan: home in AM   Medical Consultants:    cards     Subjective:   Has not had any food since procedure  Objective:    Vitals:   05/04/18 1258 05/04/18 1303 05/04/18 1307 05/04/18 1312  BP: (!) 120/54 (!) 91/46 (!) 101/54 (!) 109/50  Pulse: 69 67 69 69  Resp: 19 13 (!) 25 17  Temp:      TempSrc:      SpO2: 95% 93% 94% 95%  Weight:      Height:  Intake/Output Summary (Last 24 hours) at 05/04/2018 1341 Last data filed at 05/03/2018 1500 Gross per 24 hour  Intake 81.74 ml  Output -  Net 81.74 ml   Filed Weights   05/02/18 2158 05/03/18 0143 05/04/18 0500  Weight: 73.5 kg 73.7 kg 75.5 kg    Exam: In bed with band on wrist, no bleeding rrr No increased work of breathing No LE edema  Data Reviewed:   I have personally reviewed following labs and imaging studies:  Labs: Labs show the following:   Basic Metabolic Panel: Recent Labs  Lab 05/02/18 2357  05/03/18 0210 05/04/18 0540  NA 134*  --  139  K 3.4*  --  3.1*  CL 98  --  102  CO2 28  --  29  GLUCOSE 246*  --  211*  BUN 17  --  13  CREATININE 0.88  --  0.92  CALCIUM 9.6  --  10.0  MG  --  1.8  --    GFR Estimated Creatinine Clearance: 51.1 mL/min (by C-G formula based on SCr of 0.92 mg/dL). Liver Function Tests: No results for input(s): AST, ALT, ALKPHOS, BILITOT, PROT, ALBUMIN in the last 168 hours. No results for input(s): LIPASE, AMYLASE in the last 168 hours. No results for input(s): AMMONIA in the last 168 hours. Coagulation profile No results for input(s): INR, PROTIME in the last 168 hours.  CBC: Recent Labs  Lab 05/02/18 2357 05/04/18 0540  WBC 10.4 9.8  HGB 13.7 14.5  HCT 41.4 43.9  MCV 87.7 87.8  PLT 274 315   Cardiac Enzymes: Recent Labs  Lab 05/03/18 0210 05/03/18 0706 05/03/18 1230  CKTOTAL 69  --   --   TROPONINI  --  0.12* 0.23*   BNP (last 3 results) No results for input(s): PROBNP in the last 8760 hours. CBG: Recent Labs  Lab 05/03/18 2350 05/04/18 0504 05/04/18 0818 05/04/18 1001 05/04/18 1238  GLUCAP 163* 200* 270* 288* 217*   D-Dimer: No results for input(s): DDIMER in the last 72 hours. Hgb A1c: No results for input(s): HGBA1C in the last 72 hours. Lipid Profile: No results for input(s): CHOL, HDL, LDLCALC, TRIG, CHOLHDL, LDLDIRECT in the last 72 hours. Thyroid function studies: No results for input(s): TSH, T4TOTAL, T3FREE, THYROIDAB in the last 72 hours.  Invalid input(s): FREET3 Anemia work up: No results for input(s): VITAMINB12, FOLATE, FERRITIN, TIBC, IRON, RETICCTPCT in the last 72 hours. Sepsis Labs: Recent Labs  Lab 05/02/18 2357 05/04/18 0540  WBC 10.4 9.8    Microbiology No results found for this or any previous visit (from the past 240 hour(s)).  Procedures and diagnostic studies:  Dg Chest 2 View  Result Date: 05/03/2018 CLINICAL DATA:  72 year old female with chest pain. EXAM: CHEST - 2 VIEW  COMPARISON:  None. FINDINGS: There are bibasilar linear and platelike atelectasis. There is no focal consolidation, pleural effusion, or pneumothorax. The cardiac silhouette is within normal limits. No acute osseous pathology. IMPRESSION: No active cardiopulmonary disease. Electronically Signed   By: Anner Crete M.D.   On: 05/03/2018 00:46    Medications:   . amLODipine  10 mg Oral Daily  . aspirin EC  81 mg Oral Daily  . cholecalciferol  2,000 Units Oral Daily  . DULoxetine  30 mg Oral Daily  . ezetimibe  10 mg Oral Daily  . insulin pump   Subcutaneous Q4H  . latanoprost  1 drop Both Eyes QHS  . metoprolol tartrate  12.5 mg Oral BID  .  rosuvastatin  40 mg Oral q1800  . traZODone  50 mg Oral QHS   Continuous Infusions: . heparin 900 Units/hr (05/04/18 1113)     LOS: 0 days   Geradine Girt  [facility]   *Please refer to [email or web address], password [email or web address] to get updated schedule on who will round on this patient, as hospitalists switch teams weekly. If 7PM-7AM, please contact night-coverage at [email or web address], password [email or web address] for any overnight needs.  05/04/2018, 1:41 PM

## 2018-05-04 NOTE — Progress Notes (Signed)
Report given to cath lab at bedside

## 2018-05-04 NOTE — Care Management Note (Addendum)
Case Management Note  Patient Details  Name: Kelsey Pierce MRN: [id number] Date of Birth: 03-Aug-1946  Subjective/Objective:        Presents with CP/ NSTEMI, hx of diabetes mellitus, fibromyalgia, coronary artery disease, hypertension, and anxiety. Resides alone. Prior to hospital visit, independent with ADL's , DME usage.  Delma Freeze (Daughter)     [phone]      PCP: Dr. Camelia Eng  Action/Plan: Transition to home when medically stable....NCM  following for transitional care needs.  [facility] [place] [facility] per NCM to confirm medication( Brilinta ) in stock, however was told med not available.  NCM called [facility] regarding the meds to bed program in which the pt can have his Rx medications delivered @ bedside prior to d/c. [facility] would only need the prescriptions sent electronically via epic by the MD to [facility] and medication will be delivered to pt prior to d/c.   Expected Discharge Date:                  Expected Discharge Plan:     In-House Referral:     Discharge planning Services  CM Consult  Post Acute Care Choice:    Choice offered to:     DME Arranged:    DME Agency:     HH Arranged:    HH Agency:     Status of Service:  In process, will continue to follow  If discussed at Long Length of Stay Meetings, dates discussed:    Additional Comments:  Sharin Mons, RN 05/04/2018, 1:59 PM

## 2018-05-04 NOTE — Progress Notes (Signed)
Benefits check in process for Brilinta 90 mg twice a day.NCM to f/u with results. Whitman Hero RN,BSN,CM [phone]

## 2018-05-04 NOTE — Progress Notes (Signed)
Pt now states pain free. Daughter at bedside

## 2018-05-04 NOTE — Progress Notes (Signed)
TRBAND REMOVAL  LOCATION:    right radial  DEFLATED PER PROTOCOL:    Yes.    TIME BAND OFF / DRESSING APPLIED:    1700   SITE UPON ARRIVAL:    Level 0  SITE AFTER BAND REMOVAL:    Level 0  CIRCULATION SENSATION AND MOVEMENT:    Within Normal Limits   Yes.    COMMENTS:   Tolerated procedure well 

## 2018-05-04 NOTE — Progress Notes (Signed)
Patient insisted on turning Insulin pump on after rechecking BS and it was 165.

## 2018-05-04 NOTE — Care Management (Signed)
05-04-18  BENEFITS CHECK:  # 6.   S/W JADA  @ [facility] # [phone]  BRILINTA  90 MG BID COVER-YES CO-PAY- $ 35.00 TIER- 2 DRUG PRIOR APPROVAL- NO  PR# EFERRED PHARMACY : YES  [facility]

## 2018-05-04 NOTE — Progress Notes (Signed)
Inpatient Diabetes Program Recommendations  AACE/ADA: New Consensus Statement on Inpatient Glycemic Control (2019)  Target Ranges:  Prepandial:   less than 140 mg/dL      Peak postprandial:   less than 180 mg/dL (1-2 hours)      Critically ill patients:  140 - 180 mg/dL  Results for Kelsey Pierce, Kelsey Pierce (MRN [id number]) as of 05/04/2018 09:41  Ref. Range 05/03/2018 08:04 05/03/2018 12:58 05/03/2018 17:47 05/03/2018 21:50 05/03/2018 23:50 05/04/2018 05:04  Glucose-Capillary Latest Ref Range: 70 - 99 mg/dL 173 (H) 137 (H) 345 (H)  8.6 units via pump 70 163 (H) 200 (H)    Review of Glycemic Control  Diabetes history: DM1 (makes NO insulin; requires basal, correction, and meal coverage insulin) Outpatient Diabetes medications: Medtronic insulin pump with Humalog Current orders for Inpatient glycemic control: Insulin Pump  Spoke with patient regarding diabetes and home regimen for diabetes management.  Patient states that she has DM1 and has had for 72 years old and she is followed by Dr. Reynaldo Minium for diabetes management. Patient uses a Medtronic 670G insulin pump with Humalog insulin as an outpatient. Patient has insulin pump connected but currently off since 7am. Patient states that she was told she needed to cut her insulin pump off at midnight but she did not feel comfortable doing that so she waited and turned it off this morning. Patient's current glucose is 270 mg/dl. Asked patient to restart her insulin pump which she did at 9:15 am. Reviewed insulin pump settings:  Current insulin pump settings are as follows:  Basal insulin  12A 0.9 units/hour 7A 1.0 units/hour 9A 1.10 units/hour 6P 1.0 units/hour 7P 0.9 units/hour Total daily basal insulin: 23.7 units/24 hours  Carb Coverage 1:12 1 unit for every 10 grams of carbohydrates  Insulin Sensitivity 1:35 1 unit drops blood glucose 35 mg/dl  Target Glucose Goals 12A 120 mg/dl  Active Insulin: 3 hours  Discussed insulin pump use while  inpatient. Explained that she will continue to use her insulin pump and we will ask that her glucose be checked Q4H while NPO waiting on cath (add on; likely to be done this afternoon).  Encouraged patient to call nurse if she felt her glucose was getting low.  If patient experiences any issues with hypoglycemia would recommend decreasing basal rates by 20% (will need attending doctor to give an order for patient to do so).   Patient verbalized understanding of information discussed and states that she does not have any further questions related to diabetes at this time. Talked with Everlene Farrier, RN an asked to page Inpatient Diabetes Coordinator if any questions or issues arise.  NURSING: Once insulin pump order set is ordered please print off the Patient insulin pump contract and flow sheet. The insulin pump contract should be signed by the patient and then placed in the chart. The patient insulin pump flow sheet will be completed by the patient at the bedside and the RN caring for the patient will use the patient's flow sheet to document in the [facility]. RN will need to complete the Nursing Insulin Pump Flowsheet at least once a shift. Patient will need to keep extra insulin pump supplies at the bedside at all times.   Thanks, Barnie Alderman, RN, MSN, CDE Diabetes Coordinator Inpatient Diabetes Program [phone] (Team Pager from 8am to 5pm)

## 2018-05-04 NOTE — Progress Notes (Signed)
Kelsey Pierce for Heparin Indication: chest pain/ACS  Allergies  Allergen Reactions  . Erythromycin Other (See Comments)    Abdominal pain  . Lisinopril Other (See Comments)    Stopped due to acute kidney injury and hyperkalemia    Patient Measurements: Height: 5\' 2"  (157.5 cm) Weight: 166 lb 7.2 oz (75.5 kg) IBW/kg (Calculated) : 50.1 Heparin Dosing Weight: 65.9 kg  Vital Signs: Temp: 98.7 F (37.1 C) (09/30 0500) Temp Source: Oral (09/30 0500) BP: 137/62 (09/30 0500) Pulse Rate: 67 (09/30 0500)  Labs: Recent Labs    05/02/18 2357 05/03/18 0210 05/03/18 0706 05/03/18 1230 05/03/18 1836 05/04/18 0540  HGB 13.7  --   --   --   --  14.5  HCT 41.4  --   --   --   --  43.9  PLT 274  --   --   --   --  315  HEPARINUNFRC  --   --   --   --  0.28* 0.43  CREATININE 0.88  --   --   --   --  0.92  CKTOTAL  --  69  --   --   --   --   TROPONINI  --   --  0.12* 0.23*  --   --     Estimated Creatinine Clearance: 51.1 mL/min (by C-G formula based on SCr of 0.92 mg/dL).   Medical History: Past Medical History:  Diagnosis Date  . Adenomatous colon polyp 2014   repeat colon in 5 years  . Bilateral cataracts   . Chest pain    2D ECHO, 05/29/2011 -EF >55%, normal; MYOVIEW, 05/29/2011 - normal  . Depression   . Diabetes ([place])   . Fracture, foot june 2014   left  . Glaucoma   . Hyperlipidemia   . Hypertension   . Lichen sclerosus et atrophicus of the vulva 05/2014  . Neuropathy    legs  . PMB (postmenopausal bleeding)     Assessment: 72 YO F with PMH of DM, HTN, CAD, fibromyalgia presenting with chest pain. Pharmacy consulted to dose heparin. Pt not on anticoagulation PTA.  -heparin level is 0.43 and at goal on 900 units/hr    Goal of Therapy:  Heparin level 0.3-0.7 units/ml Monitor platelets by anticoagulation protocol: Yes   Plan:  -Continue heparin at 900 units/hr -confirmatory heparin level in 8 hours -daily heparin level  and CBC  Brand Siever A. Levada Dy, PharmD, [place] Pager: [phone] Please utilize Amion for appropriate phone number to reach the unit pharmacist ([place])

## 2018-05-04 NOTE — Progress Notes (Signed)
.   Progress Note  Patient Name: Kelsey Pierce Date of Encounter: 05/04/2018  Primary Cardiologist: Pixie Casino, MD    Subjective   Nervous about cath no chest pain   Inpatient Medications    Scheduled Meds: . amLODipine  10 mg Oral Daily  . [START ON 05/05/2018] aspirin  81 mg Oral Pre-Cath  . aspirin EC  81 mg Oral Daily  . cholecalciferol  2,000 Units Oral Daily  . DULoxetine  30 mg Oral Daily  . ezetimibe  10 mg Oral Daily  . insulin pump   Subcutaneous TID AC, HS, 0200  . latanoprost  1 drop Both Eyes QHS  . metoprolol tartrate  12.5 mg Oral BID  . rosuvastatin  40 mg Oral q1800  . traZODone  50 mg Oral QHS   Continuous Infusions: . sodium chloride    . [START ON 05/05/2018] sodium chloride     Followed by  . [START ON 05/05/2018] sodium chloride    . heparin 900 Units/hr (05/03/18 2157)   PRN Meds: sodium chloride, acetaminophen, ALPRAZolam, gi cocktail, morphine injection, nitroGLYCERIN   Vital Signs    Vitals:   05/03/18 1235 05/04/18 0500 05/04/18 0902 05/04/18 0911  BP: (!) 123/59 137/62 (!) 154/94 (!) 164/70  Pulse: 66 67    Resp: 16 18 16 16   Temp: 98.5 F (36.9 C) 98.7 F (37.1 C)    TempSrc: Oral Oral    SpO2: 96% 92% 100% 98%  Weight:  75.5 kg    Height:        Intake/Output Summary (Last 24 hours) at 05/04/2018 1009 Last data filed at 05/03/2018 1500 Gross per 24 hour  Intake 81.74 ml  Output -  Net 81.74 ml   Filed Weights   05/02/18 2158 05/03/18 0143 05/04/18 0500  Weight: 73.5 kg 73.7 kg 75.5 kg    Telemetry    NSR 05/04/2018  - Personally Reviewed  ECG    NSR  Normal  - Personally Reviewed  Physical Exam  Anxious  GEN: No acute distress.   Neck: No JVD Cardiac: RRR, no murmurs, rubs, or gallops.  Respiratory: Clear to auscultation bilaterally. GI: Soft, nontender, non-distended  MS: No edema; No deformity. Neuro:  Nonfocal  Psych: Normal affect   Labs    Chemistry Recent Labs  Lab 05/02/18 2357  05/04/18 0540  NA 134* 139  K 3.4* 3.1*  CL 98 102  CO2 28 29  GLUCOSE 246* 211*  BUN 17 13  CREATININE 0.88 0.92  CALCIUM 9.6 10.0  GFRNONAA >60 60*  GFRAA >60 >60  ANIONGAP 8 8     Hematology Recent Labs  Lab 05/02/18 2357 05/04/18 0540  WBC 10.4 9.8  RBC 4.72 5.00  HGB 13.7 14.5  HCT 41.4 43.9  MCV 87.7 87.8  MCH 29.0 29.0  MCHC 33.1 33.0  RDW 13.2 13.7  PLT 274 315    Cardiac Enzymes Recent Labs  Lab 05/03/18 0706 05/03/18 1230  TROPONINI 0.12* 0.23*    Recent Labs  Lab 05/02/18 2346  TROPIPOC 0.02     BNPNo results for input(s): BNP, PROBNP in the last 168 hours.   DDimer No results for input(s): DDIMER in the last 168 hours.   Radiology    Dg Chest 2 View  Result Date: 05/03/2018 CLINICAL DATA:  72 year old female with chest pain. EXAM: CHEST - 2 VIEW COMPARISON:  None. FINDINGS: There are bibasilar linear and platelike atelectasis. There is no focal consolidation, pleural effusion, or  pneumothorax. The cardiac silhouette is within normal limits. No acute osseous pathology. IMPRESSION: No active cardiopulmonary disease. Electronically Signed   By: Anner Crete M.D.   On: 05/03/2018 00:46    Cardiac Studies   TTE EF 55-60%   Patient Profile     72 y.o. female admitted with chest pain and elevated troponin for cath 05/04/18  Assessment & Plan    Chest Pain:  ECG ok no documented CAD before troponin .12 to .23.  TTE with normal EF and no RWMAls  On heparin for cath today discussed risks willing to proceed has good radial pulse On board for Dr Tamala Julian  HLD:  On statin   DM:  Insulin pump DM coordinator to see per primary service  HTN:  Well controlled.  Continue current medications and low sodium Dash type diet.         For questions or updates, please contact [place] Please consult [email or web address] for contact info under        Signed, Jenkins Rouge, MD  05/04/2018, 10:09 AM

## 2018-05-04 NOTE — Progress Notes (Signed)
Pt c/o chest pain burning through to back 7/10 bp 154/94

## 2018-05-04 NOTE — Interval H&P Note (Signed)
Cath Lab Visit (complete for each Cath Lab visit)  Clinical Evaluation Leading to the Procedure:   ACS: No.  Non-ACS:    Anginal Classification: CCS III  Anti-ischemic medical therapy: Minimal Therapy (1 class of medications)  Non-Invasive Test Results: No non-invasive testing performed  Prior CABG: No previous CABG      History and Physical Interval Note:  05/04/2018 11:47 AM  Kelsey Pierce  has presented today for surgery, with the diagnosis of NSTEMI  The various methods of treatment have been discussed with the patient and family. After consideration of risks, benefits and other options for treatment, the patient has consented to  Procedure(s): LEFT HEART CATH AND CORONARY ANGIOGRAPHY (N/A) as a surgical intervention .  The patient's history has been reviewed, patient examined, no change in status, stable for surgery.  I have reviewed the patient's chart and labs.  Questions were answered to the patient's satisfaction.     Belva Crome III

## 2018-05-04 NOTE — Progress Notes (Signed)
Diabetic coordinator at bedside.  

## 2018-05-04 NOTE — H&P (View-Only) (Signed)
.   Progress Note  Patient Name: Kelsey Pierce Date of Encounter: 05/04/2018  Primary Cardiologist: Pixie Casino, MD    Subjective   Nervous about cath no chest pain   Inpatient Medications    Scheduled Meds: . amLODipine  10 mg Oral Daily  . [START ON 05/05/2018] aspirin  81 mg Oral Pre-Cath  . aspirin EC  81 mg Oral Daily  . cholecalciferol  2,000 Units Oral Daily  . DULoxetine  30 mg Oral Daily  . ezetimibe  10 mg Oral Daily  . insulin pump   Subcutaneous TID AC, HS, 0200  . latanoprost  1 drop Both Eyes QHS  . metoprolol tartrate  12.5 mg Oral BID  . rosuvastatin  40 mg Oral q1800  . traZODone  50 mg Oral QHS   Continuous Infusions: . sodium chloride    . [START ON 05/05/2018] sodium chloride     Followed by  . [START ON 05/05/2018] sodium chloride    . heparin 900 Units/hr (05/03/18 2157)   PRN Meds: sodium chloride, acetaminophen, ALPRAZolam, gi cocktail, morphine injection, nitroGLYCERIN   Vital Signs    Vitals:   05/03/18 1235 05/04/18 0500 05/04/18 0902 05/04/18 0911  BP: (!) 123/59 137/62 (!) 154/94 (!) 164/70  Pulse: 66 67    Resp: 16 18 16 16   Temp: 98.5 F (36.9 C) 98.7 F (37.1 C)    TempSrc: Oral Oral    SpO2: 96% 92% 100% 98%  Weight:  75.5 kg    Height:        Intake/Output Summary (Last 24 hours) at 05/04/2018 1009 Last data filed at 05/03/2018 1500 Gross per 24 hour  Intake 81.74 ml  Output -  Net 81.74 ml   Filed Weights   05/02/18 2158 05/03/18 0143 05/04/18 0500  Weight: 73.5 kg 73.7 kg 75.5 kg    Telemetry    NSR 05/04/2018  - Personally Reviewed  ECG    NSR  Normal  - Personally Reviewed  Physical Exam  Anxious  GEN: No acute distress.   Neck: No JVD Cardiac: RRR, no murmurs, rubs, or gallops.  Respiratory: Clear to auscultation bilaterally. GI: Soft, nontender, non-distended  MS: No edema; No deformity. Neuro:  Nonfocal  Psych: Normal affect   Labs    Chemistry Recent Labs  Lab 05/02/18 2357  05/04/18 0540  NA 134* 139  K 3.4* 3.1*  CL 98 102  CO2 28 29  GLUCOSE 246* 211*  BUN 17 13  CREATININE 0.88 0.92  CALCIUM 9.6 10.0  GFRNONAA >60 60*  GFRAA >60 >60  ANIONGAP 8 8     Hematology Recent Labs  Lab 05/02/18 2357 05/04/18 0540  WBC 10.4 9.8  RBC 4.72 5.00  HGB 13.7 14.5  HCT 41.4 43.9  MCV 87.7 87.8  MCH 29.0 29.0  MCHC 33.1 33.0  RDW 13.2 13.7  PLT 274 315    Cardiac Enzymes Recent Labs  Lab 05/03/18 0706 05/03/18 1230  TROPONINI 0.12* 0.23*    Recent Labs  Lab 05/02/18 2346  TROPIPOC 0.02     BNPNo results for input(s): BNP, PROBNP in the last 168 hours.   DDimer No results for input(s): DDIMER in the last 168 hours.   Radiology    Dg Chest 2 View  Result Date: 05/03/2018 CLINICAL DATA:  72 year old female with chest pain. EXAM: CHEST - 2 VIEW COMPARISON:  None. FINDINGS: There are bibasilar linear and platelike atelectasis. There is no focal consolidation, pleural effusion, or  pneumothorax. The cardiac silhouette is within normal limits. No acute osseous pathology. IMPRESSION: No active cardiopulmonary disease. Electronically Signed   By: Anner Crete M.D.   On: 05/03/2018 00:46    Cardiac Studies   TTE EF 55-60%   Patient Profile     72 y.o. female admitted with chest pain and elevated troponin for cath 05/04/18  Assessment & Plan    Chest Pain:  ECG ok no documented CAD before troponin .12 to .23.  TTE with normal EF and no RWMAls  On heparin for cath today discussed risks willing to proceed has good radial pulse On board for Dr Tamala Julian  HLD:  On statin   DM:  Insulin pump DM coordinator to see per primary service  HTN:  Well controlled.  Continue current medications and low sodium Dash type diet.         For questions or updates, please contact [place] Please consult [email or web address] for contact info under        Signed, Jenkins Rouge, MD  05/04/2018, 10:09 AM

## 2018-05-04 NOTE — Progress Notes (Signed)
Pt sttes she gave herself 4.8 units insulin through insulin pump after cbg of 288

## 2018-05-05 DIAGNOSIS — I200 Unstable angina: Secondary | ICD-10-CM

## 2018-05-05 LAB — BASIC METABOLIC PANEL
Anion gap: 8 (ref 5–15)
BUN: 12 mg/dL (ref 8–23)
CALCIUM: 8.8 mg/dL — AB (ref 8.9–10.3)
CHLORIDE: 107 mmol/L (ref 98–111)
CO2: 23 mmol/L (ref 22–32)
CREATININE: 0.83 mg/dL (ref 0.44–1.00)
GFR calc Af Amer: >60 mL/min (ref >60)
GFR calc non Af Amer: >60 mL/min (ref >60)
GLUCOSE: 116 mg/dL — AB (ref 70–99)
Potassium: 3.1 mmol/L — ABNORMAL LOW (ref 3.5–5.1)
Sodium: 138 mmol/L (ref 135–145)

## 2018-05-05 LAB — CBC
HCT: 38.5 % (ref 36.0–46.0)
Hemoglobin: 12.8 g/dL (ref 12.0–15.0)
MCH: 29.1 pg (ref 26.0–34.0)
MCHC: 33.2 g/dL (ref 30.0–36.0)
MCV: 87.5 fL (ref 78.0–100.0)
PLATELETS: 250 10*3/uL (ref 150–400)
RBC: 4.4 MIL/uL (ref 3.87–5.11)
RDW: 13.6 % (ref 11.5–15.5)
WBC: 8.3 10*3/uL (ref 4.0–10.5)

## 2018-05-05 LAB — GLUCOSE, CAPILLARY
Glucose-Capillary: 208 mg/dL — ABNORMAL HIGH (ref 70–99)
Glucose-Capillary: 96 mg/dL (ref 70–99)
Glucose-Capillary: 116 mg/dL — ABNORMAL HIGH (ref 70–99)
Glucose-Capillary: 270 mg/dL — ABNORMAL HIGH (ref 70–99)

## 2018-05-05 LAB — HEMOGLOBIN AND HEMATOCRIT, BLOOD
HEMATOCRIT: 40.8 % (ref 36.0–46.0)
Hemoglobin: 13.8 g/dL (ref 12.0–15.0)

## 2018-05-05 MED ORDER — NITROGLYCERIN 0.4 MG SL SUBL: 0.4000 mg | SUBLINGUAL_TABLET | SUBLINGUAL | 0 refills | Status: DC | PRN

## 2018-05-05 MED ORDER — ASPIRIN 81 MG PO TBEC: 81.0000 mg | DELAYED_RELEASE_TABLET | Freq: Every day | ORAL | 0 refills | Status: AC

## 2018-05-05 MED ORDER — ROSUVASTATIN CALCIUM 40 MG PO TABS: 40.0000 mg | ORAL_TABLET | Freq: Every day | ORAL | 0 refills | Status: DC

## 2018-05-05 MED ORDER — METOPROLOL TARTRATE 25 MG PO TABS: 12.5000 mg | ORAL_TABLET | Freq: Two times a day (BID) | ORAL | 0 refills | Status: AC

## 2018-05-05 MED ORDER — HEART ATTACK BOUNCING BOOK
Freq: Once | Status: AC
  Administered 2018-05-05: 02:00:00
  Filled 2018-05-05: qty 1

## 2018-05-05 MED ORDER — LOPERAMIDE HCL 2 MG PO CAPS: 2.0000 mg | ORAL_CAPSULE | ORAL | Status: DC | PRN

## 2018-05-05 MED ORDER — POTASSIUM CHLORIDE CRYS ER 20 MEQ PO TBCR
40.0000 meq | EXTENDED_RELEASE_TABLET | Freq: Once | ORAL | Status: AC
  Administered 2018-05-05: 08:00:00 40 meq via ORAL
  Filled 2018-05-05: qty 2

## 2018-05-05 MED ORDER — TICAGRELOR 90 MG PO TABS: 90.0000 mg | ORAL_TABLET | Freq: Two times a day (BID) | ORAL | 1 refills | Status: DC

## 2018-05-05 MED ORDER — ANGIOPLASTY BOOK
Freq: Once | Status: AC
  Administered 2018-05-05: 02:00:00
  Filled 2018-05-05: qty 1

## 2018-05-05 MED FILL — BRILINTA 90 MG TABLET: 90 | 30 days supply | Qty: 60 | Fill #0 | Status: TO

## 2018-05-05 NOTE — Progress Notes (Signed)
CARDIAC REHAB PHASE I   PRE:  Rate/Rhythm: 76 SR    BP: sitting 144/55    SaO2:   MODE:  Ambulation: 420 ft   POST:  Rate/Rhythm: 80 SR    BP: sitting 148/55     SaO2:   Pt feels weak from being in bed over weekend. Slightly shaky. No angina. Ed completed with pt and daughters.Understands the importance of Brilinta. Will refer to [place].  [place], ACSM 05/05/2018 9:11 AM

## 2018-05-05 NOTE — Discharge Summary (Signed)
Physician Discharge Summary  Kelsey Pierce ZDG:[id number] DOB: Jan 21, 1946 DOA: 05/02/2018  PCP: Burnard Bunting, MD  Admit date: 05/02/2018 Discharge date: 05/05/2018  Admitted From: Home  Disposition: Home   Recommendations for Outpatient Follow-up:  1. Follow up with PCP in 1-2 weeks 2. Please obtain BMP/CBC in one week 3. Follow up with cardiology for further care/   Home Health: no  Discharge Condition: stable.  CODE STATUS: full code.  Diet recommendation: Carb Modified   Brief/Interim Summary: Kelsey Pierce is an 72 y.o. female with medical history significant forinsulin-dependent diabetes mellitus, fibromyalgia, coronary artery disease, hypertension, and anxiety now presenting to the emergency department for evaluation of intermittent chest pain. Patient reports that she experienced a burning pain in the central chest shortly after eating breakfast on 05/01/2018. There was some radiation towards both arms and towards her back, and symptoms resolved spontaneously over the course of the morning. The same symptoms recurred the following day. She has been doing some light activity about the house without any chest pain. She denies associated shortness of breath, nausea, diaphoresis, and denies lower extremity swelling or tenderness. She has not experienced these same symptoms in the chest previously, but has had similar pains in her mid and upper back that she attributes to fibromyalgia. She also has some tenderness to the chest with palpation, and also tender in her mid and upper back, as well as shoulders which she reports to be chronic and part of her fibromyalgia. States that she had some mild transient burning pain in the chest when she raised her arms up for CXR in the ED.she received full dose aspirin prior to arrival in the ED.  NSTEMI -s/p cath with stent placement:  High-grade culprit obstruction in the mid right coronary, 95%.  The culprit was treated with  angioplasty and stenting using a Sierra 28 x 3.0 mm stent postdilated to 3.5 mm at 15 atm reducing the stenosis to less than 10% with TIMI grade III flow.  Widely patent left main  50 to 70% mid LAD. First diagonal contains 70% segmental ostial to proximal narrowing.  Circumflex artery is widely patent without significant obstructive disease.  Normal left ventricular function with EF 55 to 60%. LVEDP is normal at 14 mmHg  RECOMMENDATIONS:   Aspirin and Brilinta for 12 months.  Aggressive risk factor modification: A1c less than 7, LDL less than 70, 150 minutes of moderate aerobic activity per week, target blood pressure 130/80 mmHg.  Stable to be discharge today.    Insulin-dependent DM -Check CBG's, continue insulin pump  Hypertension -BP at goal -Continue   Norvasc -hold chlorthalidone, started on metoprolol./ SBP yesterday 100  Fibromyalgia -Continue Cymbalta   obesity Body mass index is 30.44 kg/m.  Diarrhea; resolved. Hb stable. No evidence of bleeding.   Discharge Diagnoses:  Principal Problem:   Chest pain Active Problems:   Anxiety   IDDM (insulin dependent diabetes mellitus) ([place])   Coronary artery disease involving native coronary artery of native heart without angina pectoris   Essential hypertension   Prolonged QT interval   Fibromyalgia   NSTEMI (non-ST elevated myocardial infarction) [facility])    Discharge Instructions  Discharge Instructions    Amb Referral to Cardiac Rehabilitation   Complete by:  As directed    Diagnosis:   Coronary Stents PTCA     Diet - low sodium heart healthy   Complete by:  As directed    Increase activity slowly   Complete by:  As directed  Allergies as of 05/05/2018      Reactions   Erythromycin Other (See Comments)   Abdominal pain   Lisinopril Other (See Comments)   Stopped due to acute kidney injury and hyperkalemia      Medication List    STOP taking these medications    chlorthalidone 25 MG tablet Commonly known as:  HYGROTON     TAKE these medications   ACCU-CHEK AVIVA PLUS test strip Generic drug:  glucose blood   ALPRAZolam 0.25 MG tablet Commonly known as:  XANAX Take 0.25 mg by mouth 3 (three) times daily as needed for anxiety.   amLODipine 10 MG tablet Commonly known as:  NORVASC Take 1 tablet (10 mg total) by mouth daily.   aspirin 81 MG EC tablet Take 1 tablet (81 mg total) by mouth daily. Start taking on:  05/06/2018   clobetasol ointment 0.05 % Commonly known as:  TEMOVATE APPLY TOPICALLY 2 (TWO) TIMES A WEEK. USE TWICE WEEKLY AS NEEDED TO CONTROL SYMPTOMS. What changed:    how much to take  when to take this  reasons to take this  additional instructions   DENTA 5000 PLUS 1.1 % Crea dental cream Generic drug:  sodium fluoride Place 1 application onto teeth at bedtime.   DULoxetine 30 MG capsule Commonly known as:  CYMBALTA Take 30 mg by mouth daily.   ezetimibe 10 MG tablet Commonly known as:  ZETIA Take 1 tablet (10 mg total) by mouth daily.   HYDROcodone-acetaminophen 5-325 MG tablet Commonly known as:  NORCO/VICODIN Take 1 tablet by mouth every 4 (four) hours as needed (pain).   insulin pump Soln Inject into the skin See admin instructions. Use with Humalog   latanoprost 0.005 % ophthalmic solution Commonly known as:  XALATAN Place 1 drop into both eyes at bedtime.   metoprolol tartrate 25 MG tablet Commonly known as:  LOPRESSOR Take 0.5 tablets (12.5 mg total) by mouth 2 (two) times daily.   nitroGLYCERIN 0.4 MG SL tablet Commonly known as:  NITROSTAT Place 1 tablet (0.4 mg total) under the tongue every 5 (five) minutes x 3 doses as needed for chest pain.   rosuvastatin 40 MG tablet Commonly known as:  CRESTOR Take 1 tablet (40 mg total) by mouth daily at 6 PM. What changed:    medication strength  how much to take  when to take this   ticagrelor 90 MG Tabs tablet Commonly known as:   BRILINTA Take 1 tablet (90 mg total) by mouth 2 (two) times daily.   traZODone 50 MG tablet Commonly known as:  DESYREL Take 50 mg by mouth at bedtime.   Vitamin D 2000 units tablet Take 2,000 Units by mouth daily.      Follow-up Information    Pixie Casino, MD Follow up on 05/19/2018.   Specialty:  Cardiology Why:  Please arrive 15 minutes early for your 10:15am post-hospital cardiology follow-up appointment Contact information: [place] [address] [phone]          Allergies  Allergen Reactions  . Erythromycin Other (See Comments)    Abdominal pain  . Lisinopril Other (See Comments)    Stopped due to acute kidney injury and hyperkalemia    Consultations:  Cardiology    Procedures/Studies: Dg Chest 2 View  Result Date: 05/03/2018 CLINICAL DATA:  72 year old female with chest pain. EXAM: CHEST - 2 VIEW COMPARISON:  None. FINDINGS: There are bibasilar linear and platelike atelectasis. There is no focal consolidation,  pleural effusion, or pneumothorax. The cardiac silhouette is within normal limits. No acute osseous pathology. IMPRESSION: No active cardiopulmonary disease. Electronically Signed   By: Anner Crete M.D.   On: 05/03/2018 00:46      Subjective: She denies chest pain.  She had 4 BM yesterday.  No BM since 2 am, last night. She repot one of the BM was black, subsequently brown.  Patient had BM later today and nurse reported BM green no black.   Discharge Exam: Vitals:   05/05/18 0800 05/05/18 1051  BP: (!) 144/55 (!) 143/73  Pulse: 76 (!) 58  Resp: (!) 27 17  Temp: 98.7 F (37.1 C) 98.4 F (36.9 C)  SpO2: 96% 98%   Vitals:   05/05/18 0200 05/05/18 0647 05/05/18 0800 05/05/18 1051  BP: (!) 106/34 (!) 131/48 (!) 144/55 (!) 143/73  Pulse: 60 63 76 (!) 58  Resp: 15 18 (!) 27 17  Temp:   98.7 F (37.1 C) 98.4 F (36.9 C)  TempSrc:   Oral   SpO2: 94% 96% 96% 98%  Weight: 71.8 kg     Height:         General: Pt is alert, awake, not in acute distress Cardiovascular: RRR, S1/S2 +, no rubs, no gallops Respiratory: CTA bilaterally, no wheezing, no rhonchi Abdominal: Soft, NT, ND, bowel sounds + Extremities: no edema, no cyanosis    The results of significant diagnostics from this hospitalization (including imaging, microbiology, ancillary and laboratory) are listed below for reference.     Microbiology: No results found for this or any previous visit (from the past 240 hour(s)).   Labs: BNP (last 3 results) No results for input(s): BNP in the last 8760 hours. Basic Metabolic Panel: Recent Labs  Lab 05/02/18 2357 05/03/18 0210 05/04/18 0540 05/05/18 0327  NA 134*  --  139 138  K 3.4*  --  3.1* 3.1*  CL 98  --  102 107  CO2 28  --  29 23  GLUCOSE 246*  --  211* 116*  BUN 17  --  13 12  CREATININE 0.88  --  0.92 0.83  CALCIUM 9.6  --  10.0 8.8*  MG  --  1.8  --   --    Liver Function Tests: No results for input(s): AST, ALT, ALKPHOS, BILITOT, PROT, ALBUMIN in the last 168 hours. No results for input(s): LIPASE, AMYLASE in the last 168 hours. No results for input(s): AMMONIA in the last 168 hours. CBC: Recent Labs  Lab 05/02/18 2357 05/04/18 0540 05/05/18 0327 05/05/18 1124  WBC 10.4 9.8 8.3  --   HGB 13.7 14.5 12.8 ABNORMAL  HCT 41.4 43.9 38.5 ABNORMAL  MCV 87.7 87.8 87.5  --   PLT 274 315 250  --    Cardiac Enzymes: Recent Labs  Lab 05/03/18 0210 05/03/18 0706 05/03/18 1230 05/04/18 1057  CKTOTAL 69  --   --   --   TROPONINI  --  0.12* 0.23* 0.09*   BNP: Invalid input(s): POCBNP CBG: Recent Labs  Lab 05/04/18 1633 05/04/18 2118 05/05/18 0227 05/05/18 0613 05/05/18 1228  GLUCAP 228* 176* 96 116* 270*   D-Dimer No results for input(s): DDIMER in the last 72 hours. Hgb A1c No results for input(s): HGBA1C in the last 72 hours. Lipid Profile No results for input(s): CHOL, HDL, LDLCALC, TRIG, CHOLHDL, LDLDIRECT in the last 72  hours. Thyroid function studies No results for input(s): TSH, T4TOTAL, T3FREE, THYROIDAB in the last 72 hours.  Invalid  input(s): FREET3 Anemia work up No results for input(s): VITAMINB12, FOLATE, FERRITIN, TIBC, IRON, RETICCTPCT in the last 72 hours. Urinalysis    Component Value Date/Time   BILIRUBINUR n 06/05/2016 1130   PROTEINUR trace 06/05/2016 1130   UROBILINOGEN negative 06/05/2016 1130   NITRITE n 06/05/2016 1130   LEUKOCYTESUR Negative 06/05/2016 1130   Sepsis Labs Invalid input(s): PROCALCITONIN,  WBC,  LACTICIDVEN Microbiology No results found for this or any previous visit (from the past 240 hour(s)).   Time coordinating discharge: 35 minutes.   SIGNED:   Elmarie Shiley, MD  [facility] 05/05/2018, 12:55 PM Pager [phone]  If 7PM-7AM, please contact night-coverage [email or web address] Password [email or web address]

## 2018-05-05 NOTE — Progress Notes (Signed)
.   Progress Note  Patient Name: Kelsey Pierce Date of Encounter: 05/05/2018  Primary Cardiologist: Pixie Casino, MD    Subjective   No complaints   Inpatient Medications    Scheduled Meds: . amLODipine  10 mg Oral Daily  . aspirin EC  81 mg Oral Daily  . cholecalciferol  2,000 Units Oral Daily  . DULoxetine  30 mg Oral Daily  . ezetimibe  10 mg Oral Daily  . insulin pump   Subcutaneous TID AC, HS, 0200  . latanoprost  1 drop Both Eyes QHS  . metoprolol tartrate  12.5 mg Oral BID  . rosuvastatin  40 mg Oral q1800  . sodium chloride flush  3 mL Intravenous Q12H  . ticagrelor  90 mg Oral BID  . traZODone  50 mg Oral QHS   Continuous Infusions: . sodium chloride     PRN Meds: sodium chloride, acetaminophen, ALPRAZolam, gi cocktail, loperamide, morphine injection, nitroGLYCERIN, ondansetron (ZOFRAN) IV, sodium chloride flush   Vital Signs    Vitals:   05/05/18 0100 05/05/18 0200 05/05/18 0647 05/05/18 0800  BP: (!) 118/36 (!) 106/34 (!) 131/48 (!) 144/55  Pulse: 64 60 63 76  Resp: 20 15 18  (!) 27  Temp:    98.7 F (37.1 C)  TempSrc:    Oral  SpO2: 95% 94% 96% 96%  Weight:  71.8 kg    Height:        Intake/Output Summary (Last 24 hours) at 05/05/2018 0905 Last data filed at 05/05/2018 0700 Gross per 24 hour  Intake 662 ml  Output 450 ml  Net 212 ml   Filed Weights   05/03/18 0143 05/04/18 0500 05/05/18 0200  Weight: 73.7 kg 75.5 kg 71.8 kg    Telemetry    NSR 05/05/2018  - Personally Reviewed  ECG    NSR  Normal  - Personally Reviewed  Physical Exam  Anxious  GEN: No acute distress.   Neck: No JVD Cardiac: RRR, no murmurs, rubs, or gallops.  Respiratory: Clear to auscultation bilaterally. GI: Soft, nontender, non-distended  MS: No edema; No deformity. Neuro:  Nonfocal  Psych: Normal affect  Right radial cath sight minor bruising   Labs    Chemistry Recent Labs  Lab 05/02/18 2357 05/04/18 0540 05/05/18 0327  NA 134* 139 138  K  3.4* 3.1* 3.1*  CL 98 102 107  CO2 28 29 23   GLUCOSE 246* 211* 116*  BUN 17 13 12   CREATININE 0.88 0.92 0.83  CALCIUM 9.6 10.0 8.8*  GFRNONAA >60 60* >60  GFRAA >60 >60 >60  ANIONGAP 8 8 8      Hematology Recent Labs  Lab 05/02/18 2357 05/04/18 0540 05/05/18 0327  WBC 10.4 9.8 8.3  RBC 4.72 5.00 4.40  HGB 13.7 14.5 12.8  HCT 41.4 43.9 38.5  MCV 87.7 87.8 87.5  MCH 29.0 29.0 29.1  MCHC 33.1 33.0 33.2  RDW 13.2 13.7 13.6  PLT 274 315 250    Cardiac Enzymes Recent Labs  Lab 05/03/18 0706 05/03/18 1230 05/04/18 1057  TROPONINI 0.12* 0.23* 0.09*    Recent Labs  Lab 05/02/18 2346  TROPIPOC 0.02     BNPNo results for input(s): BNP, PROBNP in the last 168 hours.   DDimer No results for input(s): DDIMER in the last 168 hours.   Radiology    No results found.  Cardiac Studies   TTE EF 55-60%   Patient Profile     72 y.o. female admitted with chest  pain and elevated troponin cath 05/04/18 with DES to 99% stenosed RCA  Assessment & Plan    Chest Pain:  Post DES to RCA DAT for a year ok to d/c home   HLD:  On statin   DM:  Insulin pump DM coordinator to see per primary service  HTN:  Well controlled.  Continue current medications and low sodium Dash type diet.       F/U [place] office then Dr Debara Pickett   For questions or updates, please contact [facility] Please consult [email or web address] for contact info under        Signed, Jenkins Rouge, MD  05/05/2018, 9:05 AM

## 2018-05-07 ENCOUNTER — Telehealth (HOSPITAL_COMMUNITY): Payer: Self-pay

## 2018-05-07 NOTE — Telephone Encounter (Signed)
Made initial call to patient in regards to Cardiac Rehab - Patient stated she is interested in the program.  Explained scheduling process, went over insurance, and patient verbalized understanding. Will contact for scheduling after f/u appt has been completed.

## 2018-05-09 IMAGING — NM NM MISC PROCEDURE
6 series · 36 of 36 positions shown · non-contrast
Comparison: none

[Series 1: wbr_r-proj_st wbr rest · 6.40mm/px · 6 of 64 frames shown]
[frame 6/64]
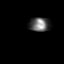
[frame 16/64]
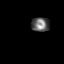
[frame 27/64]
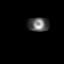
[frame 38/64]
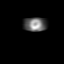
[frame 48/64]
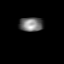
[frame 59/64]
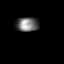

[Series 1: wbr rest · 6.40mm/px · 6 of 64 frames shown]
[frame 6/64]
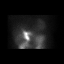
[frame 16/64]
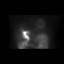
[frame 27/64]
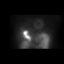
[frame 38/64]
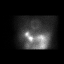
[frame 48/64]
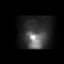
[frame 59/64]
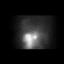

[Series 2: wbr_s-proj_st wbr stress-gsp · 6.40mm/px · 6 of 512 frames shown]
[frame 43/512]
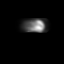
[frame 128/512]
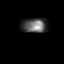
[frame 214/512]
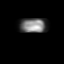
[frame 299/512]
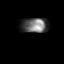
[frame 384/512]
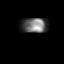
[frame 470/512]
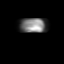

[Series 2: wbr stress-gsp · 6.40mm/px · 6 of 512 frames shown]
[frame 43/512]
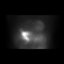
[frame 128/512]
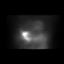
[frame 214/512]
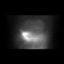
[frame 299/512]
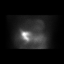
[frame 384/512]
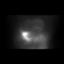
[frame 470/512]
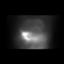

[Series 3: wbr_s-proj_st wbr stress-sum-em · 6.40mm/px · 6 of 64 frames shown]
[frame 6/64]
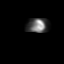
[frame 16/64]
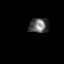
[frame 27/64]
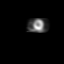
[frame 38/64]
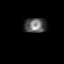
[frame 48/64]
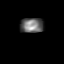
[frame 59/64]
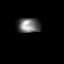

[Series 3: wbr stress-sum-em · 6.40mm/px · 6 of 64 frames shown]
[frame 6/64]
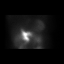
[frame 16/64]
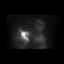
[frame 27/64]
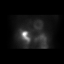
[frame 38/64]
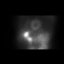
[frame 48/64]
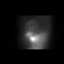
[frame 59/64]
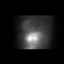

[36 of 36 positions shown; findings below may reference images not displayed]

Canned report from images found in remote index.

Refer to host system for actual result text.

## 2018-05-19 ENCOUNTER — Encounter: Payer: Self-pay | Admitting: Internal Medicine

## 2018-05-19 ENCOUNTER — Ambulatory Visit: Payer: Medicare Other | Admitting: Internal Medicine

## 2018-05-19 VITALS — BP 104/60 | HR 55 | Ht 62.0 in | Wt 167.0 lb

## 2018-05-19 DIAGNOSIS — Z955 Presence of coronary angioplasty implant and graft: Secondary | ICD-10-CM

## 2018-05-19 DIAGNOSIS — M7989 Other specified soft tissue disorders: Secondary | ICD-10-CM

## 2018-05-19 DIAGNOSIS — I739 Peripheral vascular disease, unspecified: Secondary | ICD-10-CM

## 2018-05-19 DIAGNOSIS — E785 Hyperlipidemia, unspecified: Secondary | ICD-10-CM | POA: Diagnosis not present

## 2018-05-19 NOTE — Progress Notes (Signed)
OFFICE NOTE  Chief Complaint:  Hospital follow-up  Primary Care Physician: Burnard Bunting, MD  HPI:  Kelsey Pierce  is a 72 year old female with a history of epigastric pain, a systolic murmur, which on echo there was only trace amount of MR and mild TR. She also had mild concentric LVH and left atrial enlargement with a normal LV systolic function. There was not  impaired diastolic dysfunction. I have talked about some of the signs of hypertensive heart disease and felt that we should treat her more aggressively for her blood pressure and recommended starting lisinopril 20 mg daily. She has since followed up with Kelsey Pierce, our clinical pharmacist, who had added 5 mg of amlodipine for blood pressures that remained elevated. I did review a two month blood pressure chart from her, which shows very good control of her blood pressure between 937 to 902 systolic. Heart rate has ranged basically in the 60s and low 70s.   Kelsey Pierce returns today for followup. Symptomatically, she is doing very well. Denies any chest pain, worsening shortness of breath, or any associated problems. She is overdue for an echocardiogram to evaluate for change in her valvular heart disease.  05/20/2016  Kelsey Pierce was seen today for follow-up. At her last office visit I repeated an echocardiogram as she had a louder systolic murmur. LVEF was 65-70% with grade 1 diastolic dysfunction and very mild aortic stenosis with a mean gradient of 8 mmHg. There was a questionable mobile density in the right atrium. A CT angiogram was recommended. This was performed on 06/19/2015. No mobile density was noted, however she was found to have coronary artery disease. Specifically her coronary artery calcium score was 678. There was moderate coronary disease in the proximal LAD and RCA as well as mild concentric LVH. She was asymptomatic the time and no further testing was undertaken however she reports over the past several  months she's become progressively more fatigue, particularly with exertion. She also has less energy overall and does get short of breath doing things like raking leaves. She denies any chest pain. She reports poor sleep at night however this is a chronic problem.  07/04/2016  Kelsey Pierce was seen back today in follow-up. She underwent a nuclear stress test which is negative for ischemia. She notes an improvement in her fatigue, particularly after discontinuing her lisinopril. She was found to have some acute renal insufficiency and hyperkalemia. The lisinopril was stopped to allow for this and she says that she feels better. It may be whatever caused her to have some renal dysfunction was the culprit for her symptoms. I subsequently increased her amlodipine to 10 mg to compensate for her blood pressure change although blood pressure is mildly elevated today and she notes more leg swelling.  12/16/2016  Kelsey Pierce was seen today in follow-up. Overall seems to be doing pretty well. She denies any worsening shortness of breath. She reports walking regularly without symptoms of chest pain or dyspnea. Blood pressure control is much better. Initially was 156/76 but a recheck blood pressure came down to 130/56. She's not had any significant swelling. She does report she takes chlorthalidone at night. She gets up usually once a night to urinate and this may be improved by taking the medicine the morning. Overall her morning blood pressures around 409 systolic. She does have mild aortic stenosis by echo in 2016. A stress test last year showed no ischemia with an EF of 68%.  12/02/2017  Kelsey Pierce returns today  for follow-up.  Overall she is without complaints.  She brought a list of blood pressures from home which indicate generally her blood pressure in the 528U and 132G systolic.  At times she has been up to 165.  I do not see any blood pressures in the 120 range.  Today her blood pressure in the office was  160/62.  She had labs with Dr. Reynaldo Minium last in June 2018.  Her total cholesterol is 175, HDL 71, LDL 90 and triglycerides 68.  Given her history of diabetes with cardiovascular risk, her goal LDL should be less than 70.  She is currently on rosuvastatin 20 mg daily.  05/19/2018  Kelsey Pierce is seen today in hospital follow-up.  Unfortunately she presented with chest pain concerning for unstable angina in the end of September.  Previously I had further adjusted her blood pressure and cholesterol medications.  This chest discomfort radiated through to her mid back.  She did rule in for non-STEMI with elevated troponin up to 0.23.  Ultimately she underwent left heart catheterization on 05/04/2018 which showed 95% mid RCA stenosis treated with a [place] 28 x 3 mm stent postdilated to 3.5 mm and there was additional 50 to 70% mid LAD stenosis with a diagonal vessel that was 70% stenotic.  LVEDP was normal.  LVEF was normal.  Since discharge she is done well without any worsening shortness of breath or recurrent chest pain.  Aggressive medical therapy was recommended.  She has been on aspirin and Brilinta but notes she does get shortness of breath after taking her Brilinta which is improves with caffeine.  Her lipid profile was not reassessed during hospitalization.  Despite adding Zetia in April, her last labs from PCP were in July which showed an LDL of 85, still not at goal LDL less than 70.  She is also long-standing insulin-dependent diabetic but is not on Jardiance which is independent cardiovascular risk reduction in diabetics.  PMHx:  Past Medical History:  Diagnosis Date  . Adenomatous colon polyp 2014   repeat colon in 5 years  . Bilateral cataracts   . Chest pain    2D ECHO, 05/29/2011 -EF >55%, normal; MYOVIEW, 05/29/2011 - normal  . Depression   . Diabetes ([place])   . Fracture, foot june 2014   left  . Glaucoma   . Hyperlipidemia   . Hypertension   . Lichen sclerosus et atrophicus of the vulva  05/2014  . Neuropathy    legs  . PMB (postmenopausal bleeding)     Past Surgical History:  Procedure Laterality Date  . CARPAL TUNNEL RELEASE Bilateral   . CATARACT EXTRACTION W/ INTRAOCULAR LENS IMPLANT Bilateral 06/2015  . CESAREAN SECTION  1974  . CHOLECYSTECTOMY  1969  . CORONARY STENT INTERVENTION N/A 05/04/2018   Procedure: CORONARY STENT INTERVENTION;  Surgeon: Belva Crome, MD;  Location: [facility];  Service: Cardiovascular;  Laterality: N/A;  . ENDOMETRIAL BIOPSY  09-30-97   Dr Ricard Dillon  . HYSTEROSCOPY  02-10-97   D&C, polyp--focal hyperplasia w/o atypia  . LEFT HEART CATH AND CORONARY ANGIOGRAPHY N/A 05/04/2018   Procedure: LEFT HEART CATH AND CORONARY ANGIOGRAPHY;  Surgeon: Belva Crome, MD;  Location: [facility];  Service: Cardiovascular;  Laterality: N/A;  . TUBAL LIGATION Bilateral     FAMHx:  Family History  Problem Relation Age of Onset  . Cancer Mother        Lung cancer  . Stroke Father 31  . Heart disease  Father   . Hypertension Father   . Cancer Brother        Liver cancer  . Cancer Maternal Grandmother        Colon cancer  . Colon cancer Maternal Grandmother 35  . Heart attack Maternal Grandfather   . Heart attack Paternal Grandmother   . Alzheimer's disease Paternal Grandfather   . Heart disease Brother 61    SOCHx:   reports that she has never smoked. She has never used smokeless tobacco. She reports that she does not drink alcohol or use drugs.  ALLERGIES:  Allergies  Allergen Reactions  . Erythromycin Other (See Comments)    Abdominal pain  . Lisinopril Other (See Comments)    Stopped due to acute kidney injury and hyperkalemia    ROS: Pertinent items noted in HPI and remainder of comprehensive ROS otherwise negative.  HOME MEDS: Current Outpatient Medications  Medication Sig Dispense Refill  . ACCU-CHEK AVIVA PLUS test strip     . ALPRAZolam (XANAX) 0.25 MG tablet Take 0.25 mg by mouth 3 (three) times  daily as needed for anxiety.     Marland Kitchen amLODipine (NORVASC) 10 MG tablet Take 1 tablet (10 mg total) by mouth daily. 90 tablet 3  . aspirin EC 81 MG EC tablet Take 1 tablet (81 mg total) by mouth daily. 30 tablet 0  . Cholecalciferol (VITAMIN D) 2000 units tablet Take 2,000 Units by mouth daily.    . clobetasol ointment (TEMOVATE) 0.05 % APPLY TOPICALLY 2 (TWO) TIMES A WEEK. USE TWICE WEEKLY AS NEEDED TO CONTROL SYMPTOMS. (Patient taking differently: Apply 1 application topically 4 (four) times daily as needed (lichen sclerosus flares). ) 30 g 0  . DULoxetine (CYMBALTA) 30 MG capsule Take 30 mg by mouth daily.     Marland Kitchen ezetimibe (ZETIA) 10 MG tablet Take 1 tablet (10 mg total) by mouth daily. 90 tablet 3  . HYDROcodone-acetaminophen (NORCO/VICODIN) 5-325 MG per tablet Take 1 tablet by mouth every 4 (four) hours as needed (pain).     . Insulin Human (INSULIN PUMP) SOLN Inject into the skin See admin instructions. Use with Humalog    . latanoprost (XALATAN) 0.005 % ophthalmic solution Place 1 drop into both eyes at bedtime.    . metoprolol tartrate (LOPRESSOR) 25 MG tablet Take 0.5 tablets (12.5 mg total) by mouth 2 (two) times daily. 60 tablet 0  . nitroGLYCERIN (NITROSTAT) 0.4 MG SL tablet Place 1 tablet (0.4 mg total) under the tongue every 5 (five) minutes x 3 doses as needed for chest pain. 30 tablet 0  . rosuvastatin (CRESTOR) 40 MG tablet Take 1 tablet (40 mg total) by mouth daily at 6 PM. 30 tablet 0  . sodium fluoride (DENTA 5000 PLUS) 1.1 % CREA dental cream Place 1 application onto teeth at bedtime.    . ticagrelor (BRILINTA) 90 MG TABS tablet Take 1 tablet (90 mg total) by mouth 2 (two) times daily. 60 tablet 1  . traZODone (DESYREL) 50 MG tablet Take 50 mg by mouth at bedtime.      No current facility-administered medications for this visit.     LABS/IMAGING: No results found for this or any previous visit (from the past 48 hour(s)). No results found.  VITALS: BP 104/60   Pulse (!) 55    Ht 5\' 2"  (1.575 m)   Wt 167 lb (75.8 kg)   LMP 08/06/1999 (Approximate)   BMI 30.54 kg/m   EXAM: General appearance: alert and no distress Neck: no carotid  bruit, no JVD and thyroid not enlarged, symmetric, no tenderness/mass/nodules Lungs: clear to auscultation bilaterally Heart: regular rate and rhythm, S1, S2 normal and systolic murmur: early systolic 2/6, crescendo at 2nd right intercostal space Abdomen: soft, non-tender; bowel sounds normal; no masses,  no organomegaly Extremities: edema trace bilateral pedal edema Pulses: 2+ and symmetric Skin: Skin color, texture, turgor normal. No rashes or lesions Neurologic: Grossly normal Psych: Pleasant  EKG: Sinus bradycardia 55-personally reviewed  ASSESSMENT: 1. NSTEMI-status post PCI to the RCA with DES (04/2018) - residual 50-70% mid-LAD stenosis 2. Coronary artery disease with moderate proximal LAD and RCA stenosis and a coronary calcium score of 678 (06/2015) - by CT coronary angiography 3. Hypertension-controlled 4. Anxiety  5. Palpitations - resolved 6. Insulin-dependent diabetes 7. Dyslipidemia 8. Depression 9. Murmur (mild aortic stenosis-05/2015)  PLAN: 1.   Kelsey Pierce had non-STEMI and is status post PCI to the right coronary artery with drug-eluting stent in September 2019.  She has residual 50 to 70% mid LAD stenosis.  Aggressive lipid and risk factor modification is warranted.  Her diabetes is fairly well controlled with A1c of 6.6 on insulin.  She may benefit from the addition of Jardiance irrespective of A1c based on data from the EMPA-REG study - will defer to PCP on this.  With regards to dyslipidemia, her LDL was not at goal in July.  This is on Zetia and she had taken it for several months.  Most likely she will need to start on a PCSK9 inhibitor.  I discussed this with her at length today as well as her daughter and we will try to obtain prior authorization for this.  We will also repeat a lipid profile.  If LDL  remains above 70 then I feel she would be a good candidate to start on Repatha.  Only, she is complaining of some left leg heaviness.  There is intermittent swelling.  She was previously on a diuretic which was read moved due to low potassium.  Her swelling is worse when walking or sitting for long periods of time.  She may benefit from compression stockings.  Although her pain does come on with walking and is reserved relieved at rest, one would consider claudication as a diagnosis.  Exam does reveal relatively preserved pulses, but would recommend lower extremity arterial Dopplers given her coronary history.  Plan follow-up with me afterward to review dopplers. I may also need to switch Brilinta to Plavix if she remains dyspneic.  Pixie Casino, MD, [facility], [place] Director of the Advanced Lipid Disorders &  Cardiovascular Risk Reduction Clinic Diplomate of the American Board of Clinical Lipidology Attending Cardiologist  Direct Dial: [phone]  Fax: [phone]  Website:  [email or web address] Fries 05/19/2018, 10:37 AM

## 2018-05-19 NOTE — Patient Instructions (Addendum)
Medication Instructions:  Continue current medications  Dr. Debara Pickett has recommended Repatha - please complete patient assistance application  If you need a refill on your cardiac medications before your next appointment, please call your pharmacy.   Lab work: FASTING lab work to check cholesterol - please have completed this week (10/16-10/18) If you have labs (blood work) drawn today and your tests are completely normal, you will receive your results only by: Marland Kitchen MyChart Message (if you have MyChart) OR . A paper copy in the mail If you have any lab test that is abnormal or we need to change your treatment, we will call you to review the results.  Testing/Procedures: Your physician has requested that you have a lower extremity arterial duplex. This test is an ultrasound of the arteries in the legs. It looks at arterial blood flow in the legs. Allow one hour for Lower Arterial scans. There are no restrictions or special instructions -- done @ Dr. Lysbeth Penner office  Follow-Up: At [facility], you and your health needs are our priority.  As part of our continuing mission to provide you with exceptional heart care, we have created designated Provider Care Teams.  These Care Teams include your primary Cardiologist (physician) and Advanced Practice Providers (APPs -  Physician Assistants and Nurse Practitioners) who all work together to provide you with the care you need, when you need it. You will need a follow up appointment in 1 month.  You may see Pixie Casino, MD or one of the following Advanced Practice Providers on your designated Care Team: [place], [place] . Fabian Sharp, PA-C  Any Other Special Instructions Will Be Listed Below (If Applicable).

## 2018-05-20 ENCOUNTER — Telehealth (HOSPITAL_COMMUNITY): Payer: Self-pay

## 2018-05-20 ENCOUNTER — Other Ambulatory Visit: Payer: Self-pay | Admitting: Internal Medicine

## 2018-05-20 DIAGNOSIS — I739 Peripheral vascular disease, unspecified: Secondary | ICD-10-CM

## 2018-05-20 NOTE — Telephone Encounter (Signed)
Attempted to call patient in regards to Cardiac Rehab - unable to LM

## 2018-05-22 ENCOUNTER — Ambulatory Visit (HOSPITAL_COMMUNITY)
Admission: RE | Admit: 2018-05-22 | Discharge: 2018-05-22 | Disposition: A | Discharge status: Home / Self Care | Payer: Medicare Other | Source: Ambulatory Visit | Attending: Cardiovascular Disease | Admitting: Cardiovascular Disease

## 2018-05-22 DIAGNOSIS — I739 Peripheral vascular disease, unspecified: Secondary | ICD-10-CM | POA: Insufficient documentation

## 2018-05-22 DIAGNOSIS — M7989 Other specified soft tissue disorders: Secondary | ICD-10-CM | POA: Insufficient documentation

## 2018-05-23 LAB — COMPREHENSIVE METABOLIC PANEL
ALK PHOS: 134 IU/L — AB (ref 39–117)
ALT: 135 IU/L — ABNORMAL HIGH (ref 0–32)
AST: 135 IU/L — AB (ref 0–40)
Albumin/Globulin Ratio: 1.4 (ref 1.2–2.2)
Albumin: 4.1 g/dL (ref 3.5–4.8)
BILIRUBIN TOTAL: 0.4 mg/dL (ref 0.0–1.2)
BUN/Creatinine Ratio: 14 (ref 12–28)
BUN: 14 mg/dL (ref 8–27)
CHLORIDE: 102 mmol/L (ref 96–106)
CO2: 23 mmol/L (ref 20–29)
Calcium: 9.5 mg/dL (ref 8.7–10.3)
Creatinine, Ser: 1 mg/dL (ref 0.57–1.00)
GFR calc Af Amer: 64 mL/min/{1.73_m2} (ref >59)
GFR calc non Af Amer: 56 mL/min/{1.73_m2} — ABNORMAL LOW (ref >59)
GLOBULIN, TOTAL: 2.9 g/dL (ref 1.5–4.5)
GLUCOSE: 167 mg/dL — AB (ref 65–99)
Potassium: 4.9 mmol/L (ref 3.5–5.2)
SODIUM: 140 mmol/L (ref 134–144)
Total Protein: 7 g/dL (ref 6.0–8.5)

## 2018-05-23 LAB — LIPID PANEL
CHOLESTEROL TOTAL: 124 mg/dL (ref 100–199)
Chol/HDL Ratio: 2.1 ratio (ref 0.0–4.4)
HDL: 60 mg/dL (ref >39)
LDL CALC: 49 mg/dL (ref 0–99)
Triglycerides: 75 mg/dL (ref 0–149)
VLDL CHOLESTEROL CAL: 15 mg/dL (ref 5–40)

## 2018-05-27 NOTE — Telephone Encounter (Signed)
Called patient to see if she was interested in participating in the Cardiac Rehab Program. Patient stated yes. Patient will come in for orientation on 07/21/18 @ 8:15AM and will attend the 9:45AM exercise class.  Mailed homework package.

## 2018-06-19 ENCOUNTER — Ambulatory Visit: Payer: Medicare Other | Admitting: Physician Assistant

## 2018-06-19 ENCOUNTER — Encounter: Payer: Self-pay | Admitting: Physician Assistant

## 2018-06-19 VITALS — BP 143/72 | HR 59 | Resp 16 | Ht 62.0 in | Wt 155.8 lb

## 2018-06-19 DIAGNOSIS — R7401 Elevation of levels of liver transaminase levels: Secondary | ICD-10-CM

## 2018-06-19 DIAGNOSIS — R740 Nonspecific elevation of levels of transaminase and lactic acid dehydrogenase [LDH]: Secondary | ICD-10-CM | POA: Diagnosis not present

## 2018-06-19 DIAGNOSIS — I10 Essential (primary) hypertension: Secondary | ICD-10-CM

## 2018-06-19 DIAGNOSIS — I2510 Atherosclerotic heart disease of native coronary artery without angina pectoris: Secondary | ICD-10-CM

## 2018-06-19 DIAGNOSIS — I340 Nonrheumatic mitral (valve) insufficiency: Secondary | ICD-10-CM

## 2018-06-19 DIAGNOSIS — E785 Hyperlipidemia, unspecified: Secondary | ICD-10-CM | POA: Diagnosis not present

## 2018-06-19 DIAGNOSIS — E119 Type 2 diabetes mellitus without complications: Secondary | ICD-10-CM

## 2018-06-19 NOTE — Patient Instructions (Signed)
Medication Instructions:  STOP Crestor  If you need a refill on your cardiac medications before your next appointment, please call your pharmacy.   Lab work: Your physician recommends that you return for lab work in: 2 WEEKS with Hepatic Function If you have labs (blood work) drawn today and your tests are completely normal, you will receive your results only by: Marland Kitchen MyChart Message (if you have MyChart) OR . A paper copy in the mail If you have any lab test that is abnormal or we need to change your treatment, we will call you to review the results.  Testing/Procedures: None   Follow-Up: At [facility], you and your health needs are our priority.  As part of our continuing mission to provide you with exceptional heart care, we have created designated Provider Care Teams.  These Care Teams include your primary Cardiologist (physician) and Advanced Practice Providers (APPs -  Physician Assistants and Nurse Practitioners) who all work together to provide you with the care you need, when you need it.  Your physician recommends that you schedule a follow-up appointment in: 2 months with Dr Debara Pickett. Any Other Special Instructions Will Be Listed Below (If Applicable).

## 2018-06-19 NOTE — Progress Notes (Signed)
Cardiology Office Note    Date:  06/21/2018   ID:  Kelsey Pierce, DOB [birth year], MRN [id number]  PCP:  Burnard Bunting, MD  Cardiologist:  Dr. Debara Pickett   Chief Complaint  Patient presents with  . Coronary Artery Disease    seen for Dr. Debara Pickett.     History of Present Illness:  Kelsey Pierce is a 72 y.o. female with PMH of epigastric pain, heart murmur, DM, HTN, HLD and CAD.  Last Myoview obtained on 05/30/2016 showed EF 68%, no ischemia or infarction.  She was admitted in September with NSTEMI.  Echocardiogram obtained on 05/03/2018 showed EF 55 to 60%, mild hypokinesis of inferolateral myocardium, mild MR, PA peak pressure 39 mmHg.  She underwent cardiac catheterization on 8/38 that showed 95% mid RCA lesion treated with [place] 3.0 x 28 mm DES postdilated to 3.5 mm, widely patent left main, 50 to 70% mid LAD lesion, 70% D1 lesion, widely patent left circumflex artery, EF 55 to 60% by LV gram.  Postprocedure, patient was placed on aspirin and Brilinta.  She was seen by Dr. Debara Pickett for post hospital visit on 10/15, she did have some shortness of breath with Brilinta, this improved with caffeine.  Repeat lipid panel obtained on 10/18 showed very well-controlled cholesterol profile with LDL 49, but AST and ALT were elevated. ABI obtained on 10/18 was normal.  Patient presents today for cardiology office visit.  She is feeling well without any kind of chest pain.  I will discontinue her Crestor given the elevated liver enzyme.  Based on her previous lab work in July, her liver function was normal at the time.  She will be off of statin for 2 weeks before repeating liver function test.  If her liver enzymes normalized, I will start her on 80 mg of Lipitor and recheck a fasting lipid panel 6 weeks later prior to her follow-up with Dr. Debara Pickett.   Past Medical History:  Diagnosis Date  . Adenomatous colon polyp 2014   repeat colon in 5 years  . Bilateral cataracts   . Chest pain    2D ECHO,  05/29/2011 -EF >55%, normal; MYOVIEW, 05/29/2011 - normal  . Depression   . Diabetes ([place])   . Fracture, foot june 2014   left  . Glaucoma   . Hyperlipidemia   . Hypertension   . Lichen sclerosus et atrophicus of the vulva 05/2014  . Neuropathy    legs  . PMB (postmenopausal bleeding)     Past Surgical History:  Procedure Laterality Date  . CARPAL TUNNEL RELEASE Bilateral   . CATARACT EXTRACTION W/ INTRAOCULAR LENS IMPLANT Bilateral 06/2015  . CESAREAN SECTION  1974  . CHOLECYSTECTOMY  1969  . CORONARY STENT INTERVENTION N/A 05/04/2018   Procedure: CORONARY STENT INTERVENTION;  Surgeon: Belva Crome, MD;  Location: [facility];  Service: Cardiovascular;  Laterality: N/A;  . ENDOMETRIAL BIOPSY  09-30-97   Dr Ricard Dillon  . HYSTEROSCOPY  02-10-97   D&C, polyp--focal hyperplasia w/o atypia  . LEFT HEART CATH AND CORONARY ANGIOGRAPHY N/A 05/04/2018   Procedure: LEFT HEART CATH AND CORONARY ANGIOGRAPHY;  Surgeon: Belva Crome, MD;  Location: [facility];  Service: Cardiovascular;  Laterality: N/A;  . TUBAL LIGATION Bilateral     Current Medications: Outpatient Medications Prior to Visit  Medication Sig Dispense Refill  . ACCU-CHEK AVIVA PLUS test strip     . ALPRAZolam (XANAX) 0.25 MG tablet Take 0.25 mg by mouth 3 (three)  times daily as needed for anxiety.     Marland Kitchen amLODipine (NORVASC) 10 MG tablet Take 1 tablet (10 mg total) by mouth daily. 90 tablet 3  . aspirin EC 81 MG EC tablet Take 1 tablet (81 mg total) by mouth daily. 30 tablet 0  . Cholecalciferol (VITAMIN D) 2000 units tablet Take 2,000 Units by mouth daily.    . clobetasol ointment (TEMOVATE) 0.05 % APPLY TOPICALLY 2 (TWO) TIMES A WEEK. USE TWICE WEEKLY AS NEEDED TO CONTROL SYMPTOMS. (Patient taking differently: Apply 1 application topically 4 (four) times daily as needed (lichen sclerosus flares). ) 30 g 0  . DULoxetine (CYMBALTA) 30 MG capsule Take 30 mg by mouth daily.     Marland Kitchen ezetimibe (ZETIA) 10 MG  tablet Take 1 tablet (10 mg total) by mouth daily. 90 tablet 3  . HYDROcodone-acetaminophen (NORCO/VICODIN) 5-325 MG per tablet Take 1 tablet by mouth every 4 (four) hours as needed (pain).     . Insulin Human (INSULIN PUMP) SOLN Inject into the skin See admin instructions. Use with Humalog    . latanoprost (XALATAN) 0.005 % ophthalmic solution Place 1 drop into both eyes at bedtime.    . metoprolol tartrate (LOPRESSOR) 25 MG tablet Take 0.5 tablets (12.5 mg total) by mouth 2 (two) times daily. 60 tablet 0  . nitroGLYCERIN (NITROSTAT) 0.4 MG SL tablet Place 1 tablet (0.4 mg total) under the tongue every 5 (five) minutes x 3 doses as needed for chest pain. 30 tablet 0  . sodium fluoride (DENTA 5000 PLUS) 1.1 % CREA dental cream Place 1 application onto teeth at bedtime.    . ticagrelor (BRILINTA) 90 MG TABS tablet Take 1 tablet (90 mg total) by mouth 2 (two) times daily. 60 tablet 1  . traZODone (DESYREL) 50 MG tablet Take 50 mg by mouth at bedtime.     . rosuvastatin (CRESTOR) 40 MG tablet Take 1 tablet (40 mg total) by mouth daily at 6 PM. 30 tablet 0   No facility-administered medications prior to visit.      Allergies:   Erythromycin and Lisinopril   Social History   Socioeconomic History  . Marital status: Widowed    Spouse name: Not on file  . Number of children: 2  . Years of education: Not on file  . Highest education level: Not on file  Occupational History  . Not on file  Social Needs  . Financial resource strain: Not on file  . Food insecurity:    Worry: Not on file    Inability: Not on file  . Transportation needs:    Medical: Not on file    Non-medical: Not on file  Tobacco Use  . Smoking status: Never Smoker  . Smokeless tobacco: Never Used  Substance and Sexual Activity  . Alcohol use: No    Alcohol/week: 0.0 standard drinks  . Drug use: No  . Sexual activity: Never    Partners: Male    Birth control/protection: Post-menopausal  Lifestyle  . Physical  activity:    Days per week: Not on file    Minutes per session: Not on file  . Stress: Not on file  Relationships  . Social connections:    Talks on phone: Not on file    Gets together: Not on file    Attends religious service: Not on file    Active member of club or organization: Not on file    Attends meetings of clubs or organizations: Not on file  Relationship status: Not on file  Other Topics Concern  . Not on file  Social History Narrative  . Not on file     Family History:  The patient's family history includes Alzheimer's disease in her paternal grandfather; Cancer in her brother, maternal grandmother, and mother; Colon cancer (age of onset: 78) in her maternal grandmother; Heart attack in her maternal grandfather and paternal grandmother; Heart disease in her father; Heart disease (age of onset: 28) in her brother; Hypertension in her father; Stroke (age of onset: 25) in her father.   ROS:   Please see the history of present illness.    ROS All other systems reviewed and are negative.   PHYSICAL EXAM:   VS:  BP (!) 143/72   Pulse (!) 59   Resp 16   Ht '5\' 2"'  (1.575 m)   Wt 155 lb 12.8 oz (70.7 kg)   LMP 08/06/1999 (Approximate)   SpO2 100%   BMI 28.50 kg/m    GEN: Well nourished, well developed, in no acute distress  HEENT: normal  Neck: no JVD, carotid bruits, or masses Cardiac: RRR; no murmurs, rubs, or gallops,no edema  Respiratory:  clear to auscultation bilaterally, normal work of breathing GI: soft, nontender, nondistended, + BS MS: no deformity or atrophy  Skin: warm and dry, no rash Neuro:  Alert and Oriented x 3, Strength and sensation are intact Psych: euthymic mood, full affect  Wt Readings from Last 3 Encounters:  06/19/18 155 lb 12.8 oz (70.7 kg)  05/19/18 167 lb (75.8 kg)  05/05/18 158 lb 4.6 oz (71.8 kg)      Studies/Labs Reviewed:   EKG:  EKG is not ordered today.    Recent Labs: 05/03/2018: Magnesium 1.8 05/05/2018: Hemoglobin 13.8;  Platelets 250 05/22/2018: ALT 135; BUN 14; Creatinine, Ser 1.00; Potassium 4.9; Sodium 140   Lipid Panel    Component Value Date/Time   CHOL 124 05/22/2018 1013   TRIG 75 05/22/2018 1013   HDL 60 05/22/2018 1013   CHOLHDL 2.1 05/22/2018 1013   LDLCALC 49 05/22/2018 1013    Additional studies/ records that were reviewed today include:   Myoview 05/30/2016 Study Highlights     The left ventricular ejection fraction is hyperdynamic (>65%).  Nuclear stress EF: 68%.  Blood pressure demonstrated a normal response to exercise.  There was no ST segment deviation noted during stress.  The study is normal.   Normal stress nuclear study with no ischemia or infarction; EF 68 with normal wall motion.    Echo 05/03/2018 LV EF: 55% -   60% Study Conclusions  - Left ventricle: The cavity size was normal. Wall thickness was   increased in a pattern of mild LVH. Systolic function was normal.   The estimated ejection fraction was in the range of 55% to 60%.   There is mild hypokinesis of the inferolateral myocardium. The   study is not technically sufficient to allow evaluation of LV   diastolic function. - Mitral valve: Calcified annulus. There was mild regurgitation. - Right ventricle: The cavity size was mildly dilated. - Pulmonary arteries: Systolic pressure was mildly increased. PA   peak pressure: 39 mm Hg (S).  Impressions:  - Mild hypokinesis of the inferolateral wall with overall preserved   LV systolic function; mild LVH; mild MR; mild RVE; mild TR with   mild pulmonary hypertension.    Cath 05/04/2018  High-grade culprit obstruction in the mid right coronary, 95%.  The culprit was treated with angioplasty and  stenting using a [place] 28 x 3.0 mm stent postdilated to 3.5 mm at 15 atm reducing the stenosis to less than 10% with TIMI grade III flow.  Widely patent left main  50 to 70% mid LAD.  First diagonal contains 70% segmental ostial to proximal  narrowing.  Circumflex artery is widely patent without significant obstructive disease.  Normal left ventricular function with EF 55 to 60%.  LVEDP is normal at 14 mmHg  RECOMMENDATIONS:   Aspirin and Brilinta for 12 months.  Aggressive risk factor modification: A1c less than 7, LDL less than 70, 150 minutes of moderate aerobic activity per week, target blood pressure 130/80 mmHg.  Recommend uninterrupted dual antiplatelet therapy with Aspirin 72m daily and Ticagrelor 985mtwice daily for a minimum of 12 months (ACS - Class I recommendation).   ABI 05/22/2018 Summary: Right: Resting right ankle-brachial index is within normal range. No evidence of significant right lower extremity arterial disease. The right toe-brachial index is normal.  Left: Resting left ankle-brachial index is within normal range. No evidence of significant left lower extremity arterial disease. The left toe-brachial index is normal.   ASSESSMENT:    1. Coronary artery disease involving native coronary artery of native heart without angina pectoris   2. Elevated transaminase level   3. Essential hypertension   4. Hyperlipidemia, unspecified hyperlipidemia type   5. Controlled type 2 diabetes mellitus without complication, without long-term current use of insulin (HC[facility]akes[facility]  6. Nonrheumatic mitral valve regurgitation      PLAN:  In order of problems listed above:  1. CAD: Continue aspirin and Brilinta.  Denies any chest pain.  2. Hyperlipidemia: Previously on Crestor, due to recent elevated transaminase level, will hold Crestor and obtain liver function test in 2 weeks.  If liver function tests returned to normal, can consider start on Lipitor 80 mg daily  3. Hypertension: Blood pressure well controlled on current medication  4. DM2: Managed by primary care provider  5. Mitral valve regurgitation: Mild on last echocardiogram.    Medication Adjustments/Labs and Tests Ordered: Current medicines are  reviewed at length with the patient today.  Concerns regarding medicines are outlined above.  Medication changes, Labs and Tests ordered today are listed in the Patient Instructions below. Patient Instructions  Medication Instructions:  STOP Crestor  If you need a refill on your cardiac medications before your next appointment, please call your pharmacy.   Lab work: Your physician recommends that you return for lab work in: 2 WEEKS with Hepatic Function If you have labs (blood work) drawn today and your tests are completely normal, you will receive your results only by: . Marland KitchenyChart Message (if you have MyChart) OR . A paper copy in the mail If you have any lab test that is abnormal or we need to change your treatment, we will call you to review the results.  Testing/Procedures: None   Follow-Up: [facility] are our [facility].  As part of our continuing mission to provide you with exceptional heart care, we have created designated Provider Care Teams.  These Care Teams include your primary Cardiologist (physician) and Advanced Practice Providers (APPs -  Physician Assistants and Nurse Practitioners) who all work together to provide you with the care you need, when you need it.  Your physician recommends that you schedule a follow-up appointment in: 2 months with Dr HiDebara PickettAny Other Special Instructions Will Be Listed Below (If Applicable).     Signed, HaAlmyra Deforest  PA  06/21/2018 10:59 PM    [facility] [place], [place], [place]  [address] Phone: [phone]; Fax: [phone]

## 2018-06-21 ENCOUNTER — Encounter: Payer: Self-pay | Admitting: Physician Assistant

## 2018-06-30 ENCOUNTER — Other Ambulatory Visit: Payer: Self-pay

## 2018-06-30 MED ORDER — TICAGRELOR 90 MG PO TABS: 90.0000 mg | ORAL_TABLET | Freq: Two times a day (BID) | ORAL | 1 refills | Status: DC

## 2018-06-30 NOTE — Addendum Note (Signed)
Addended by: De Burrs on: 06/30/2018 05:31 PM   Modules accepted: Orders

## 2018-07-01 ENCOUNTER — Telehealth: Payer: Self-pay | Admitting: Internal Medicine

## 2018-07-01 MED ORDER — TICAGRELOR 90 MG PO TABS: 90.0000 mg | ORAL_TABLET | Freq: Two times a day (BID) | ORAL | 6 refills | Status: DC

## 2018-07-01 NOTE — Telephone Encounter (Signed)
New message     *STAT* If patient is at the pharmacy, call can be transferred to refill team.   1. Which medications need to be refilled? (please list name of each medication and dose if known) ticagrelor (BRILINTA) 90 MG TABS tablet  2. Which pharmacy/location (including street and city if local pharmacy) is medication to be sent to?[id number] - [place],  - [place].  3. Do they need a 30 day or 90 day supply? [place]

## 2018-07-01 NOTE — Telephone Encounter (Signed)
Script sent as requested ./cy

## 2018-07-06 LAB — HEPATIC FUNCTION PANEL
ALK PHOS: 111 IU/L (ref 39–117)
ALT: 17 IU/L (ref 0–32)
AST: 26 IU/L (ref 0–40)
Albumin: 4.2 g/dL (ref 3.5–4.8)
Bilirubin Total: 0.4 mg/dL (ref 0.0–1.2)
Bilirubin, Direct: 0.11 mg/dL (ref 0.00–0.40)
Total Protein: 7.2 g/dL (ref 6.0–8.5)

## 2018-07-07 ENCOUNTER — Other Ambulatory Visit: Payer: Self-pay

## 2018-07-07 ENCOUNTER — Telehealth: Payer: Self-pay | Admitting: Internal Medicine

## 2018-07-07 DIAGNOSIS — I10 Essential (primary) hypertension: Secondary | ICD-10-CM

## 2018-07-07 DIAGNOSIS — E785 Hyperlipidemia, unspecified: Secondary | ICD-10-CM

## 2018-07-07 MED ORDER — ATORVASTATIN CALCIUM 80 MG PO TABS: 80.0000 mg | ORAL_TABLET | Freq: Every day | ORAL | 3 refills | Status: DC

## 2018-07-07 NOTE — Telephone Encounter (Signed)
Patient called back, given lab results. Patient verbalized understanding.

## 2018-07-07 NOTE — Telephone Encounter (Signed)
Follow Up:      Returning your call from today. 

## 2018-07-08 ENCOUNTER — Telehealth: Payer: Self-pay | Admitting: Internal Medicine

## 2018-07-08 NOTE — Telephone Encounter (Signed)
Spoke with Kelsey Pierce from PCP office.   PCP recommended that she take crestor 40mg  daily which was on previous med list per 11/15. She was taken off this d/t LFTs and a recheck of labs had normalized so PA recommended atorvastatin 80mg  QD. Patient called her PCP office to notify them that she prefers to take crestor as she had SE on atorvastatin in the past.   Advise Kelsey Pierce I will call patient for clarification on this issue.   Called patient - she reports muscle aches & pains on atorvastatin but she tolerated crestor. Will route to [facility], who made med change, for further recommendations.

## 2018-07-10 ENCOUNTER — Telehealth (HOSPITAL_COMMUNITY): Payer: Self-pay

## 2018-07-13 ENCOUNTER — Telehealth (HOSPITAL_COMMUNITY): Payer: Self-pay | Admitting: Pharmacist

## 2018-07-13 NOTE — Progress Notes (Signed)
72 y.o. G75P2012 Widowed Caucasian female here for annual exam.    Denies vaginal bleeding.  Has an occasional flare of her lichen sclerosus.  Uses clobetasol ointment once day a month to control symptoms. Does not need refills.   Recovering from cardiac stent surgery done in October.  Was having chest and back pain prior to this.  Starting cardiac rehab next week.   Taking blood thinner, Brilinta.   Labs with PCP and cardiology.   PCP:  Burnard Bunting, MD   Patient's last menstrual period was 08/06/1999 (approximate).          Sexually active: No.  The current method of family planning is post menopausal status.    Exercising: Yes.    walking Smoker:  no  Health Maintenance: Pap:06-18-17 Neg, 05-10-15 Neg History of abnormal Pap:  no MMG: 01-26-18 3D Neg/density C/BiRads2 Colonoscopy: 03-25-13 polyp;next due 03/2018--pt.knows to schedule--but had cardiac issues this year. BMD: 2013  Result : Normal with Dr.Aronson TDaP: 01/2015 Gardasil:   no HIV: not needed per PCP Hep C: not needed per PCP Screening Labs:  Hb today: PCP  Flu vaccine - done.    reports that she has never smoked. She has never used smokeless tobacco. She reports that she does not drink alcohol or use drugs.  Past Medical History:  Diagnosis Date  . Adenomatous colon polyp 2014   repeat colon in 5 years  . Bilateral cataracts   . Chest pain    2D ECHO, 05/29/2011 -EF >55%, normal; MYOVIEW, 05/29/2011 - normal  . Depression   . Diabetes ([place])   . Fracture, foot june 2014   left  . Glaucoma   . Hyperlipidemia   . Hypertension   . Lichen sclerosus et atrophicus of the vulva 05/2014  . Neuropathy    legs  . PMB (postmenopausal bleeding)     Past Surgical History:  Procedure Laterality Date  . CARPAL TUNNEL RELEASE Bilateral   . CATARACT EXTRACTION W/ INTRAOCULAR LENS IMPLANT Bilateral 06/2015  . CESAREAN SECTION  1974  . CHOLECYSTECTOMY  1969  . CORONARY STENT INTERVENTION N/A 05/04/2018   Procedure: CORONARY STENT INTERVENTION;  Surgeon: Belva Crome, MD;  Location: [facility];  Service: Cardiovascular;  Laterality: N/A;  . ENDOMETRIAL BIOPSY  09-30-97   Dr Ricard Dillon  . HYSTEROSCOPY  02-10-97   D&C, polyp--focal hyperplasia w/o atypia  . LEFT HEART CATH AND CORONARY ANGIOGRAPHY N/A 05/04/2018   Procedure: LEFT HEART CATH AND CORONARY ANGIOGRAPHY;  Surgeon: Belva Crome, MD;  Location: [facility];  Service: Cardiovascular;  Laterality: N/A;  . TUBAL LIGATION Bilateral     Current Outpatient Medications  Medication Sig Dispense Refill  . ACCU-CHEK AVIVA PLUS test strip     . ALPRAZolam (XANAX) 0.25 MG tablet Take 0.25 mg by mouth 3 (three) times daily as needed for anxiety.     Marland Kitchen amLODipine (NORVASC) 10 MG tablet Take 1 tablet (10 mg total) by mouth daily. 90 tablet 3  . aspirin EC 81 MG EC tablet Take 1 tablet (81 mg total) by mouth daily. 30 tablet 0  . Cholecalciferol (VITAMIN D) 2000 units tablet Take 2,000 Units by mouth daily.    . clobetasol ointment (TEMOVATE) 0.05 % APPLY TOPICALLY 2 (TWO) TIMES A WEEK. USE TWICE WEEKLY AS NEEDED TO CONTROL SYMPTOMS. (Patient taking differently: Apply 1 application topically 4 (four) times daily as needed (lichen sclerosus flares). ) 30 g 0  . DULoxetine (CYMBALTA) 30 MG capsule  Take 30 mg by mouth daily.     Marland Kitchen ezetimibe (ZETIA) 10 MG tablet Take 1 tablet (10 mg total) by mouth daily. 90 tablet 3  . HYDROcodone-acetaminophen (NORCO/VICODIN) 5-325 MG per tablet Take 1 tablet by mouth every 4 (four) hours as needed (pain).     . Insulin Human (INSULIN PUMP) SOLN Inject into the skin See admin instructions. Use with Humalog    . latanoprost (XALATAN) 0.005 % ophthalmic solution Place 1 drop into both eyes at bedtime.    . metoprolol tartrate (LOPRESSOR) 25 MG tablet Take 0.5 tablets (12.5 mg total) by mouth 2 (two) times daily. 60 tablet 0  . nitroGLYCERIN (NITROSTAT) 0.4 MG SL tablet Place 1 tablet (0.4 mg total)  under the tongue every 5 (five) minutes x 3 doses as needed for chest pain. 30 tablet 0  . sodium fluoride (DENTA 5000 PLUS) 1.1 % CREA dental cream Place 1 application onto teeth at bedtime.    . ticagrelor (BRILINTA) 90 MG TABS tablet Take 1 tablet (90 mg total) by mouth 2 (two) times daily. 60 tablet 6  . traZODone (DESYREL) 50 MG tablet Take 50 mg by mouth at bedtime.     Marland Kitchen atorvastatin (LIPITOR) 80 MG tablet Take 1 tablet (80 mg total) by mouth daily. (Patient not taking: Reported on 07/15/2018) 90 tablet 3   No current facility-administered medications for this visit.     Family History  Problem Relation Age of Onset  . Cancer Mother        Lung cancer  . Stroke Father 54  . Heart disease Father   . Hypertension Father   . Cancer Brother        Liver cancer  . Cancer Maternal Grandmother        Colon cancer  . Colon cancer Maternal Grandmother 25  . Heart attack Maternal Grandfather   . Heart attack Paternal Grandmother   . Alzheimer's disease Paternal Grandfather   . Heart disease Brother 60    Review of Systems  All other systems reviewed and are negative.   Exam:   BP (!) 150/78 (BP Location: Right Arm, Patient Position: Sitting, Cuff Size: Normal)   Pulse 70   Resp 18   Ht 5\' 2"  (1.575 m)   Wt 161 lb 9.6 oz (73.3 kg)   LMP 08/06/1999 (Approximate)   BMI 29.56 kg/m     General appearance: alert, cooperative and appears stated age Head: Normocephalic, without obvious abnormality, atraumatic Neck: no adenopathy, supple, symmetrical, trachea midline and thyroid normal to inspection and palpation Lungs: clear to auscultation bilaterally Breasts: normal appearance, no masses or tenderness, No nipple retraction or dimpling, No nipple discharge or bleeding, No axillary or supraclavicular adenopathy Heart: regular rate and rhythm Abdomen: soft, non-tender; no masses, no organomegaly Extremities: extremities normal, atraumatic, no cyanosis or edema Skin: Skin color,  texture, turgor normal. No rashes or lesions Lymph nodes: Cervical, supraclavicular, and axillary nodes normal. No abnormal inguinal nodes palpated Neurologic: Grossly normal  Pelvic: External genitalia:  Clitoral prominence and retraction of clitoral hood.  White skin color change around the labia minora and perineum.               Urethra:  normal appearing urethra with no masses, tenderness or lesions              Bartholins and Skenes: normal                 Vagina: normal appearing vagina with normal  color and discharge, no lesions              Cervix: no lesions              Pap taken: No. Bimanual Exam:  Uterus:  normal size, contour, position, consistency, mobility, non-tender              Adnexa: no mass, fullness, tenderness              Rectal exam: Yes.  .  Confirms.              Anus:  normal sphincter tone, no lesions  Chaperone was present for exam.  Assessment:   Well woman visit with normal exam. Hx lichen sclerosus and retraction of clitoral hood. Stable.  Status post cardiac stent.   Plan: Mammogram screening. Recommended self breast awareness. Pap and HR HPV as above. Guidelines for Calcium, Vitamin D, regular exercise program including cardiovascular and weight bearing exercise. Continue clobetasol ointment as currently using.  She will call if needs refills.  Follow up annually and prn.    After visit summary provided.

## 2018-07-15 ENCOUNTER — Ambulatory Visit (INDEPENDENT_AMBULATORY_CARE_PROVIDER_SITE_OTHER): Payer: Medicare Other | Admitting: Obstetrics and Gynecology

## 2018-07-15 ENCOUNTER — Other Ambulatory Visit: Payer: Self-pay

## 2018-07-15 ENCOUNTER — Encounter: Payer: Self-pay | Admitting: Obstetrics and Gynecology

## 2018-07-15 VITALS — BP 150/78 | HR 70 | Resp 18 | Ht 62.0 in | Wt 161.6 lb

## 2018-07-15 DIAGNOSIS — Z01419 Encounter for gynecological examination (general) (routine) without abnormal findings: Secondary | ICD-10-CM

## 2018-07-15 NOTE — Telephone Encounter (Signed)
Spoke with patient and provided PA recommendations. She states she just got off the phone with PA who will call her back with instructions on medications. Advised to follow the advice he provided and wait on his return call

## 2018-07-15 NOTE — Patient Instructions (Signed)

## 2018-07-15 NOTE — Telephone Encounter (Signed)
That's fine with me, but she will need a liver function test in 1-2 month to double check the liver function.

## 2018-07-16 ENCOUNTER — Other Ambulatory Visit: Payer: Self-pay | Admitting: Physician Assistant

## 2018-07-16 MED ORDER — ROSUVASTATIN CALCIUM 20 MG PO TABS: 20.0000 mg | ORAL_TABLET | Freq: Every day | ORAL | 3 refills | Status: DC

## 2018-07-16 NOTE — Telephone Encounter (Signed)
I discussed the case with Dr. Debara Pickett, plan to reduce previous crestor to 20mg  daily and repeat FLP and LFT in Feb 2020. I have informed the patient as well

## 2018-07-17 ENCOUNTER — Telehealth (HOSPITAL_COMMUNITY): Payer: Self-pay

## 2018-07-17 NOTE — Progress Notes (Signed)
Kelsey Pierce 72 y.o. female DOB Apr 17, 1946 MRN [id number]       Nutrition  No diagnosis found. Past Medical History:  Diagnosis Date  . Adenomatous colon polyp 2014   repeat colon in 5 years  . Bilateral cataracts   . Chest pain    2D ECHO, 05/29/2011 -EF >55%, normal; MYOVIEW, 05/29/2011 - normal  . Depression   . Diabetes ([place])   . Fracture, foot june 2014   left  . Glaucoma   . Hyperlipidemia   . Hypertension   . Lichen sclerosus et atrophicus of the vulva 05/2014  . Neuropathy    legs  . PMB (postmenopausal bleeding)    Meds reviewed.    Current Outpatient Medications (Endocrine & Metabolic):  Marland Kitchen  Insulin Human (INSULIN PUMP) SOLN, Inject into the skin See admin instructions. Use with Humalog  Current Outpatient Medications (Cardiovascular):  .  amLODipine (NORVASC) 10 MG tablet, Take 1 tablet (10 mg total) by mouth daily. Marland Kitchen  ezetimibe (ZETIA) 10 MG tablet, Take 1 tablet (10 mg total) by mouth daily. .  metoprolol tartrate (LOPRESSOR) 25 MG tablet, Take 0.5 tablets (12.5 mg total) by mouth 2 (two) times daily. .  nitroGLYCERIN (NITROSTAT) 0.4 MG SL tablet, Place 1 tablet (0.4 mg total) under the tongue every 5 (five) minutes x 3 doses as needed for chest pain. .  rosuvastatin (CRESTOR) 20 MG tablet, Take 1 tablet (20 mg total) by mouth at bedtime.   Current Outpatient Medications (Analgesics):  .  aspirin EC 81 MG EC tablet, Take 1 tablet (81 mg total) by mouth daily. Marland Kitchen  HYDROcodone-acetaminophen (NORCO/VICODIN) 5-325 MG per tablet, Take 1 tablet by mouth every 4 (four) hours as needed (pain).   Current Outpatient Medications (Hematological):  .  ticagrelor (BRILINTA) 90 MG TABS tablet, Take 1 tablet (90 mg total) by mouth 2 (two) times daily.  Current Outpatient Medications (Other):  Marland Kitchen  ACCU-CHEK AVIVA PLUS test strip,  .  ALPRAZolam (XANAX) 0.25 MG tablet, Take 0.25 mg by mouth 3 (three) times daily as needed for anxiety.  .  Cholecalciferol (VITAMIN D) 2000  units tablet, Take 2,000 Units by mouth daily. .  clobetasol ointment (TEMOVATE) 0.05 %, APPLY TOPICALLY 2 (TWO) TIMES A WEEK. USE TWICE WEEKLY AS NEEDED TO CONTROL SYMPTOMS. (Patient taking differently: Apply 1 application topically 4 (four) times daily as needed (lichen sclerosus flares). ) .  DULoxetine (CYMBALTA) 30 MG capsule, Take 30 mg by mouth daily.  Marland Kitchen  latanoprost (XALATAN) 0.005 % ophthalmic solution, Place 1 drop into both eyes at bedtime. .  sodium fluoride (DENTA 5000 PLUS) 1.1 % CREA dental cream, Place 1 application onto teeth at bedtime. .  traZODone (DESYREL) 50 MG tablet, Take 50 mg by mouth at bedtime.    HT: Ht Readings from Last 1 Encounters:  07/15/18 5\' 2"  (1.575 m)    WT: Wt Readings from Last 5 Encounters:  07/15/18 161 lb 9.6 oz (73.3 kg)  06/19/18 155 lb 12.8 oz (70.7 kg)  05/19/18 167 lb (75.8 kg)  05/05/18 158 lb 4.6 oz (71.8 kg)  12/02/17 167 lb 3.2 oz (75.8 kg)     BMI 29.56   07/15/18  Current tobacco use? No       Labs:  Lipid Panel     Component Value Date/Time   CHOL 124 05/22/2018 1013   TRIG 75 05/22/2018 1013   HDL 60 05/22/2018 1013   CHOLHDL 2.1 05/22/2018 1013   LDLCALC 49 05/22/2018 1013  No results found for: HGBA1C CBG (last 3)  No results for input(s): GLUCAP in the last 72 hours.  Nutrition Diagnosis ? Food-and nutrition-related knowledge deficit related to lack of exposure to information as related to diagnosis of: ? CVD ? Type 2 Diabetes ? Overweight  related to excessive energy intake as evidenced by a BMI 29.56  Nutrition Goal(s):  ? To be determined  Plan:  Pt to attend nutrition classes ? Nutrition I ? Nutrition II ? Portion Distortion  ? Diabetes Blitz ? Diabetes Q & A Will provide client-centered nutrition education as part of interdisciplinary care.   Monitor and evaluate progress toward nutrition goal with team.  Laurina Bustle, MS, RD, LDN 07/17/2018 1:53 PM

## 2018-07-17 NOTE — Telephone Encounter (Signed)
Cardiac Rehab Medication Review by a Pharmacist  Does the patient  feel that his/her medications are working for him/her?  yes  Has the patient been experiencing any side effects to the medications prescribed?  Yes, patient reports some stomach upset since starting Ticagrelor.   Does the patient measure his/her own blood pressure or blood glucose at home?  Yes, patient does check   Does the patient have any problems obtaining medications due to transportation or finances?   no  Understanding of regimen: good Understanding of indications: good Potential of compliance: good    Pharmacist comments: Patient reports checking blood glucose levels and are around 80-90s. Patient does not regularly check blood pressure at home   Gwenlyn Found, [place] D PGY1 Pharmacy Resident  Phone [phone] 07/17/2018   10:25 AM

## 2018-07-20 ENCOUNTER — Telehealth (HOSPITAL_COMMUNITY): Payer: Self-pay

## 2018-07-20 NOTE — Telephone Encounter (Signed)
**  UPDATE**  Pt insurance is active and benefits verified through [facility]. Co-pay $20.00, DED $0.00/$0.00 met, out of pocket $3,300.00/$731.79 met, co-insurance 0%. No pre-authorization. Passport, 07/20/18 @ 9:14AM, REF# [id number]

## 2018-07-21 ENCOUNTER — Encounter (HOSPITAL_COMMUNITY): Payer: Self-pay

## 2018-07-21 ENCOUNTER — Encounter (HOSPITAL_COMMUNITY)
Admission: RE | Admit: 2018-07-21 | Discharge: 2018-07-21 | Disposition: A | Discharge status: Home / Self Care | Payer: Medicare Other | Source: Ambulatory Visit | Attending: Internal Medicine | Admitting: Internal Medicine

## 2018-07-21 VITALS — Ht 63.0 in | Wt 162.0 lb

## 2018-07-21 DIAGNOSIS — Z7982 Long term (current) use of aspirin: Secondary | ICD-10-CM | POA: Diagnosis not present

## 2018-07-21 DIAGNOSIS — Z79899 Other long term (current) drug therapy: Secondary | ICD-10-CM | POA: Diagnosis not present

## 2018-07-21 DIAGNOSIS — I10 Essential (primary) hypertension: Secondary | ICD-10-CM | POA: Diagnosis not present

## 2018-07-21 DIAGNOSIS — I214 Non-ST elevation (NSTEMI) myocardial infarction: Secondary | ICD-10-CM | POA: Diagnosis present

## 2018-07-21 DIAGNOSIS — E785 Hyperlipidemia, unspecified: Secondary | ICD-10-CM | POA: Diagnosis not present

## 2018-07-21 DIAGNOSIS — Z955 Presence of coronary angioplasty implant and graft: Secondary | ICD-10-CM

## 2018-07-21 NOTE — Progress Notes (Signed)
Kelsey Pierce 72 y.o. female DOB: 1946-06-14 MRN: [id number]      Nutrition Note  1. 05/04/2018 NSTEMI (non-ST elevated myocardial infarction) ([place])   2. 05/04/2018 Stented coronary artery    Past Medical History:  Diagnosis Date  . Adenomatous colon polyp 2014   repeat colon in 5 years  . Bilateral cataracts   . Chest pain    2D ECHO, 05/29/2011 -EF >55%, normal; MYOVIEW, 05/29/2011 - normal  . Depression   . Diabetes ([place])   . Fracture, foot june 2014   left  . Glaucoma   . Hyperlipidemia   . Hypertension   . Lichen sclerosus et atrophicus of the vulva 05/2014  . Neuropathy    legs  . PMB (postmenopausal bleeding)    Meds reviewed.   Current Outpatient Medications (Endocrine & Metabolic):  Marland Kitchen  Insulin Human (INSULIN PUMP) SOLN, Inject into the skin See admin instructions. Use with Humalog  Current Outpatient Medications (Cardiovascular):  .  amLODipine (NORVASC) 10 MG tablet, Take 1 tablet (10 mg total) by mouth daily. Marland Kitchen  ezetimibe (ZETIA) 10 MG tablet, Take 1 tablet (10 mg total) by mouth daily. .  metoprolol tartrate (LOPRESSOR) 25 MG tablet, Take 0.5 tablets (12.5 mg total) by mouth 2 (two) times daily. .  nitroGLYCERIN (NITROSTAT) 0.4 MG SL tablet, Place 1 tablet (0.4 mg total) under the tongue every 5 (five) minutes x 3 doses as needed for chest pain. .  rosuvastatin (CRESTOR) 20 MG tablet, Take 1 tablet (20 mg total) by mouth at bedtime.   Current Outpatient Medications (Analgesics):  .  aspirin EC 81 MG EC tablet, Take 1 tablet (81 mg total) by mouth daily. Marland Kitchen  HYDROcodone-acetaminophen (NORCO/VICODIN) 5-325 MG per tablet, Take 1 tablet by mouth every 4 (four) hours as needed (pain).   Current Outpatient Medications (Hematological):  .  ticagrelor (BRILINTA) 90 MG TABS tablet, Take 1 tablet (90 mg total) by mouth 2 (two) times daily.  Current Outpatient Medications (Other):  Marland Kitchen  ACCU-CHEK AVIVA PLUS test strip,  .  ALPRAZolam (XANAX) 0.25 MG tablet, Take 0.25  mg by mouth 3 (three) times daily as needed for anxiety.  .  Cholecalciferol (VITAMIN D) 2000 units tablet, Take 2,000 Units by mouth daily. .  clobetasol ointment (TEMOVATE) 0.05 %, APPLY TOPICALLY 2 (TWO) TIMES A WEEK. USE TWICE WEEKLY AS NEEDED TO CONTROL SYMPTOMS. (Patient taking differently: Apply 1 application topically 4 (four) times daily as needed (lichen sclerosus flares). ) .  DULoxetine (CYMBALTA) 30 MG capsule, Take 30 mg by mouth daily.  Marland Kitchen  latanoprost (XALATAN) 0.005 % ophthalmic solution, Place 1 drop into both eyes at bedtime. .  sodium fluoride (DENTA 5000 PLUS) 1.1 % CREA dental cream, Place 1 application onto teeth at bedtime. .  traZODone (DESYREL) 50 MG tablet, Take 50 mg by mouth at bedtime.    HT: Ht Readings from Last 1 Encounters:  07/15/18 5\' 2"  (1.575 m)    WT: Wt Readings from Last 5 Encounters:  07/15/18 161 lb 9.6 oz (73.3 kg)  06/19/18 155 lb 12.8 oz (70.7 kg)  05/19/18 167 lb (75.8 kg)  05/05/18 158 lb 4.6 oz (71.8 kg)  12/02/17 167 lb 3.2 oz (75.8 kg)     There is no height or weight on file to calculate BMI.   Current tobacco use? No  Labs:  Lipid Panel     Component Value Date/Time   CHOL 124 05/22/2018 1013   TRIG 75 05/22/2018 1013   HDL 60  05/22/2018 1013   CHOLHDL 2.1 05/22/2018 1013   LDLCALC 49 05/22/2018 1013    No results found for: HGBA1C CBG (last 3)  No results for input(s): GLUCAP in the last 72 hours.  Nutrition Note Spoke with pt. Nutrition plan and goals reviewed with pt. Pt is following Step 2 of the Therapeutic Lifestyle Changes diet. Pt wants to lose wt. Pt has not been trying to lose weight. Wt loss tips reviewed (label reading, how to build a healthy plate, portion sizes, eating frequently across the day).  Pt shared she does not eat regularly across the day, that she doesn't like to cook, and that she has wide swings in her blood sugar levels. Discussed with patient that it is important to eat consistently across the day  to help manage blood sugar as well as to aid in weight loss and management of hunger. Pt has Type 1 Diabetes. Last A1c indicates blood glucose well-controlled. This Probation officer went over Diabetes Education test results. Pt checks CBG's 4 times a day. Fasting CBG's reportedly 55-190 mg/dL. Reinforced the importance of eating regularly across the day with patient, and help her develop a set schedule for eating. Distributed meal prep and planning recipes to help patient with batch cooking with quick and easy meal and snack ideas. Per discussion, pt does not use canned/convenience foods often. Pt does not add salt to food. Pt does not eat out frequently. Pt expressed understanding of the information reviewed. Pt aware of nutrition education classes offered and would like to attend nutrition classes.  Nutrition Diagnosis ? Food-and nutrition-related knowledge deficit related to lack of exposure to information as related to diagnosis of: ? CVD ? Type 1 Diabetes  Nutrition Intervention ? Pt's individual nutrition plan and goals reviewed with pt. ? Pt given handouts for: ? Nutrition I class ? Nutrition II class  ? Diabetes Blitz Class ? Diabetes Q & A class   Nutrition Goal(s):  ? Pt to identify, develop, and use a set schedule for eating ? Pt to identify food quantities necessary to achieve weight loss of 6-24 lbs. at graduation from cardiac rehab  Plan:  ? Pt to attend nutrition classes ? Nutrition I ? Nutrition II ? Portion Distortion  ? Diabetes Blitz ? Diabetes Q & Ae determined ? Will provide client-centered nutrition education as part of interdisciplinary care ? Monitor and evaluate progress toward nutrition goal with team.   Laurina Bustle, MS, RD, LDN 07/21/2018 9:41 AM

## 2018-07-21 NOTE — Progress Notes (Addendum)
Cardiac Individual Treatment Plan  Patient Details  Name: Kelsey Pierce MRN: [id number] Date of Birth: September 19, 1945 Referring Provider:   Flowsheet Row CARDIAC REHAB PHASE II ORIENTATION from 07/21/2018 in [place]  Referring Provider  Dr. Debara Pickett      Initial Encounter Date:  Flowsheet Row CARDIAC REHAB PHASE II ORIENTATION from 07/21/2018 in [place]  Date  07/21/18      Visit Diagnosis: 05/04/2018 NSTEMI (non-ST elevated myocardial infarction) ([place])  05/04/2018 Stented coronary artery  Patient's Home Medications on Admission:  Current Outpatient Medications:  .  ACCU-CHEK AVIVA PLUS test strip, , Disp: , Rfl:  .  ALPRAZolam (XANAX) 0.25 MG tablet, Take 0.25 mg by mouth 3 (three) times daily as needed for anxiety. , Disp: , Rfl:  .  amLODipine (NORVASC) 10 MG tablet, Take 1 tablet (10 mg total) by mouth daily., Disp: 90 tablet, Rfl: 3 .  aspirin EC 81 MG EC tablet, Take 1 tablet (81 mg total) by mouth daily., Disp: 30 tablet, Rfl: 0 .  Cholecalciferol (VITAMIN D) 2000 units tablet, Take 2,000 Units by mouth daily., Disp: , Rfl:  .  clobetasol ointment (TEMOVATE) 0.05 %, APPLY TOPICALLY 2 (TWO) TIMES A WEEK. USE TWICE WEEKLY AS NEEDED TO CONTROL SYMPTOMS. (Patient taking differently: Apply 1 application topically 4 (four) times daily as needed (lichen sclerosus flares). ), Disp: 30 g, Rfl: 0 .  DULoxetine (CYMBALTA) 30 MG capsule, Take 30 mg by mouth daily. , Disp: , Rfl:  .  ezetimibe (ZETIA) 10 MG tablet, Take 1 tablet (10 mg total) by mouth daily., Disp: 90 tablet, Rfl: 3 .  HYDROcodone-acetaminophen (NORCO/VICODIN) 5-325 MG per tablet, Take 1 tablet by mouth every 4 (four) hours as needed (pain). , Disp: , Rfl:  .  Insulin Human (INSULIN PUMP) SOLN, Inject into the skin See admin instructions. Use with Humalog, Disp: , Rfl:  .  latanoprost (XALATAN) 0.005 % ophthalmic solution, Place 1 drop into both eyes at  bedtime., Disp: , Rfl:  .  metoprolol tartrate (LOPRESSOR) 25 MG tablet, Take 0.5 tablets (12.5 mg total) by mouth 2 (two) times daily., Disp: 60 tablet, Rfl: 0 .  nitroGLYCERIN (NITROSTAT) 0.4 MG SL tablet, Place 1 tablet (0.4 mg total) under the tongue every 5 (five) minutes x 3 doses as needed for chest pain., Disp: 30 tablet, Rfl: 0 .  rosuvastatin (CRESTOR) 20 MG tablet, Take 1 tablet (20 mg total) by mouth at bedtime., Disp: 90 tablet, Rfl: 3 .  sodium fluoride (DENTA 5000 PLUS) 1.1 % CREA dental cream, Place 1 application onto teeth at bedtime., Disp: , Rfl:  .  ticagrelor (BRILINTA) 90 MG TABS tablet, Take 1 tablet (90 mg total) by mouth 2 (two) times daily., Disp: 60 tablet, Rfl: 6 .  traZODone (DESYREL) 50 MG tablet, Take 50 mg by mouth at bedtime. , Disp: , Rfl:   Past Medical History: Past Medical History:  Diagnosis Date  . Adenomatous colon polyp 2014   repeat colon in 5 years  . Bilateral cataracts   . Chest pain    2D ECHO, 05/29/2011 -EF >55%, normal; MYOVIEW, 05/29/2011 - normal  . Depression   . Diabetes ([place])   . Fracture, foot june 2014   left  . Glaucoma   . Hyperlipidemia   . Hypertension   . Lichen sclerosus et atrophicus of the vulva 05/2014  . Neuropathy    legs  . PMB (postmenopausal bleeding)  Tobacco Use: Social History   Tobacco Use  Smoking Status Never Smoker  Smokeless Tobacco Never Used    Labs: Recent Review Scientist, physiological    Labs for ITP Cardiac and Pulmonary Rehab Latest Ref Rng & Units 05/22/2018   Cholestrol 100 - 199 mg/dL 124   LDLCALC 0 - 99 mg/dL 49   HDL >39 mg/dL 60   Trlycerides 0 - 149 mg/dL 75      Capillary Blood Glucose: Lab Results  Component Value Date   GLUCAP 270 (H) 05/05/2018   GLUCAP 116 (H) 05/05/2018   GLUCAP 96 05/05/2018   GLUCAP 176 (H) 05/04/2018   GLUCAP 228 (H) 05/04/2018     Exercise Target Goals: Exercise Program Goal: Individual exercise prescription set using results from initial 6 min  walk test and THRR while considering  patient's activity barriers and safety.   Exercise Prescription Goal: Initial exercise prescription builds to 30-45 minutes a day of aerobic activity, 2-3 days per week.  Home exercise guidelines will be given to patient during program as part of exercise prescription that the participant will acknowledge.  Activity Barriers & Risk Stratification: Activity Barriers & Cardiac Risk Stratification - 07/21/18 0951    Activity Barriers & Cardiac Risk Stratification          Activity Barriers  Deconditioning;Muscular Weakness    Cardiac Risk Stratification  High           6 Minute Walk: 6 Minute Walk    6 Minute Walk    Row Name 07/21/18 0948   Phase  Initial   Distance  1400 feet   Walk Time  6 minutes   # of Rest Breaks  0   MPH  2.65   METS  2.65   RPE  11   VO2 Peak  9.28   Symptoms  No   Resting HR  63 bpm   Resting BP  140/64   Resting Oxygen Saturation   100 %   Exercise Oxygen Saturation  during 6 min walk  97 %   Max Ex. HR  86 bpm   Max Ex. BP  142/70   2 Minute Post BP  104/60          Oxygen Initial Assessment:   Oxygen Re-Evaluation:   Oxygen Discharge (Final Oxygen Re-Evaluation):   Initial Exercise Prescription: Initial Exercise Prescription - 07/21/18 1100    Date of Initial Exercise RX and Referring Provider          Date  07/21/18    Referring Provider  Dr. Debara Pickett    Expected Discharge Date  10/31/18        NuStep          Level  3    SPM  85    Minutes  10    METs  2.7        Arm Ergometer          Level  1    Watts  33    Minutes  10    METs  3.2        Track          Laps  9    Minutes  10    METs  2.53        Prescription Details          Frequency (times per week)  3    Duration  Progress to 30 minutes of continuous aerobic without signs/symptoms of physical distress  Intensity          THRR 40-80% of Max Heartrate  58-117    Ratings of Perceived Exertion  11-13         Progression          Progression  Continue to progress workloads to maintain intensity without signs/symptoms of physical distress.        Resistance Training          Training Prescription  Yes    Weight  3 lbs.     Reps  10-15           Perform Capillary Blood Glucose checks as needed.  Exercise Prescription Changes:   Exercise Comments:   Exercise Goals and Review: Exercise Goals    Exercise Goals    Row Name 07/21/18 0951   Increase Physical Activity  Yes   Intervention  Provide advice, education, support and counseling about physical activity/exercise needs.;Develop an individualized exercise prescription for aerobic and resistive training based on initial evaluation findings, risk stratification, comorbidities and participant's personal goals.   Expected Outcomes  Short Term: Attend rehab on a regular basis to increase amount of physical activity.   Increase Strength and Stamina  Yes   Intervention  Provide advice, education, support and counseling about physical activity/exercise needs.;Develop an individualized exercise prescription for aerobic and resistive training based on initial evaluation findings, risk stratification, comorbidities and participant's personal goals.   Expected Outcomes  Short Term: Increase workloads from initial exercise prescription for resistance, speed, and METs.   Able to understand and use rate of perceived exertion (RPE) scale  Yes   Intervention  Provide education and explanation on how to use RPE scale   Expected Outcomes  Short Term: Able to use RPE daily in rehab to express subjective intensity level;Long Term:  Able to use RPE to guide intensity level when exercising independently   Knowledge and understanding of Target Heart Rate Range (THRR)  Yes   Intervention  Provide education and explanation of THRR including how the numbers were predicted and where they are located for reference   Expected Outcomes  Short Term: Able to  state/look up THRR;Long Term: Able to use THRR to govern intensity when exercising independently;Short Term: Able to use daily as guideline for intensity in rehab   Able to check pulse independently  Yes   Intervention  Provide education and demonstration on how to check pulse in carotid and radial arteries.;Review the importance of being able to check your own pulse for safety during independent exercise   Expected Outcomes  Short Term: Able to explain why pulse checking is important during independent exercise;Long Term: Able to check pulse independently and accurately   Understanding of Exercise Prescription  Yes   Intervention  Provide education, explanation, and written materials on patient's individual exercise prescription   Expected Outcomes  Short Term: Able to explain program exercise prescription;Long Term: Able to explain home exercise prescription to exercise independently          Exercise Goals Re-Evaluation :   Discharge Exercise Prescription (Final Exercise Prescription Changes):   Nutrition:  Target Goals: Understanding of nutrition guidelines, daily intake of sodium 1500mg , cholesterol 200mg , calories 30% from fat and 7% or less from saturated fats, daily to have 5 or more servings of fruits and vegetables.  Biometrics: Pre Biometrics - 07/21/18 0949    Pre Biometrics          Height  5\' 3"  (1.6 m)    Weight  73.5 kg    Waist Circumference  36 inches    Hip Circumference  46 inches    Waist to Hip Ratio  0.78 %    BMI (Calculated)  28.71    Triceps Skinfold  34 mm    % Body Fat  28.7 %    Grip Strength  23 kg    Flexibility  14.5 in    Single Leg Stand  4.31 seconds            Nutrition Therapy Plan and Nutrition Goals: Nutrition Therapy & Goals - 07/21/18 0946    Nutrition Therapy          Diet  heart healthy, diabetic         Personal Nutrition Goals          Nutrition Goal  Pt to identify, develop, and use a set schedule for eating     Personal Goal #2  Pt to identify food quantities necessary to achieve weight loss of 6-24 lbs. at graduation from cardiac rehab        [place], educate and counsel regarding individualized specific dietary modifications aiming towards targeted core components such as weight, hypertension, lipid management, diabetes, heart failure and other comorbidities.    Expected Outcomes  Short Term Goal: Understand basic principles of dietary content, such as calories, fat, sodium, cholesterol and nutrients.;Long Term Goal: Adherence to prescribed nutrition plan.           Nutrition Assessments: Nutrition Assessments - 07/21/18 0947    MEDFICTS Scores          Pre Score  19           Nutrition Goals Re-Evaluation:   Nutrition Goals Re-Evaluation:   Nutrition Goals Discharge (Final Nutrition Goals Re-Evaluation):   Psychosocial: Target Goals: Acknowledge presence or absence of significant depression and/or stress, maximize coping skills, provide positive support system. Participant is able to verbalize types and ability to use techniques and skills needed for reducing stress and depression.  Initial Review & Psychosocial Screening:   Quality of Life Scores: Quality of Life - 07/21/18 0955    Quality of Life          Select  Quality of Life        Quality of Life Scores          Health/Function Pre  22.13 %    Socioeconomic Pre  16.58 %    Psych/Spiritual Pre  23.71 %    Family Pre  30 %    GLOBAL Pre  22.45 %          Scores of 19 and below usually indicate a poorer quality of life in these areas.  A difference of  2-3 points is a clinically meaningful difference.  A difference of 2-3 points in the total score of the Quality of Life Index has been associated with significant improvement in overall quality of life, self-image, physical symptoms, and general health in studies assessing change in quality of life.  PHQ-9: Recent  Review Flowsheet Data    There is no flowsheet data to display.     Interpretation of Total Score  Total Score Depression Severity:  1-4 = Minimal depression, 5-9 = Mild depression, 10-14 = Moderate depression, 15-19 = Moderately severe depression, 20-27 = Severe depression   Psychosocial Evaluation and Intervention:   Psychosocial Re-Evaluation:   Psychosocial Discharge (Final Psychosocial Re-Evaluation):  Vocational Rehabilitation: Provide vocational rehab assistance to qualifying candidates.   Vocational Rehab Evaluation & Intervention: Vocational Rehab - 07/21/18 1146    Initial Vocational Rehab Evaluation & Intervention          Assessment shows need for Vocational Rehabilitation  No   retired           Education: Education Goals: Education classes will be provided on a weekly basis, covering required topics. Participant will state understanding/return demonstration of topics presented.  Learning Barriers/Preferences: Learning Barriers/Preferences - 07/21/18 0957    Learning Barriers/Preferences          Learning Barriers  Sight   Glaucoma   Learning Preferences  Written Material;Individual Instruction;Group Instruction           Education Topics: Count Your Pulse:  -Group instruction provided by verbal instruction, demonstration, patient participation and written materials to support subject.  Instructors address importance of being able to find your pulse and how to count your pulse when at home without a heart monitor.  Patients get hands on experience counting their pulse with staff help and individually.   Heart Attack, Angina, and Risk Factor Modification:  -Group instruction provided by verbal instruction, video, and written materials to support subject.  Instructors address signs and symptoms of angina and heart attacks.    Also discuss risk factors for heart disease and how to make changes to improve heart health risk factors.   Functional  Fitness:  -Group instruction provided by verbal instruction, demonstration, patient participation, and written materials to support subject.  Instructors address safety measures for doing things around the house.  Discuss how to get up and down off the floor, how to pick things up properly, how to safely get out of a chair without assistance, and balance training.   Meditation and Mindfulness:  -Group instruction provided by verbal instruction, patient participation, and written materials to support subject.  Instructor addresses importance of mindfulness and meditation practice to help reduce stress and improve awareness.  Instructor also leads participants through a meditation exercise.    Stretching for Flexibility and Mobility:  -Group instruction provided by verbal instruction, patient participation, and written materials to support subject.  Instructors lead participants through series of stretches that are designed to increase flexibility thus improving mobility.  These stretches are additional exercise for major muscle groups that are typically performed during regular warm up and cool down.   Hands Only CPR:  -Group verbal, video, and participation provides a basic overview of AHA guidelines for community CPR. Role-play of emergencies allow participants the opportunity to practice calling for help and chest compression technique with discussion of AED use.   Hypertension: -Group verbal and written instruction that provides a basic overview of hypertension including the most recent diagnostic guidelines, risk factor reduction with self-care instructions and medication management.    Nutrition I class: Heart Healthy Eating:  -Group instruction provided by PowerPoint slides, verbal discussion, and written materials to support subject matter. The instructor gives an explanation and review of the Therapeutic Lifestyle Changes diet recommendations, which includes a discussion on lipid goals,  dietary fat, sodium, fiber, plant stanol/sterol esters, sugar, and the components of a well-balanced, healthy diet.   Nutrition II class: Lifestyle Skills:  -Group instruction provided by PowerPoint slides, verbal discussion, and written materials to support subject matter. The instructor gives an explanation and review of label reading, grocery shopping for heart health, heart healthy recipe modifications, and ways to make healthier choices when eating out.  Diabetes Question & Answer:  -Group instruction provided by PowerPoint slides, verbal discussion, and written materials to support subject matter. The instructor gives an explanation and review of diabetes co-morbidities, pre- and post-prandial blood glucose goals, pre-exercise blood glucose goals, signs, symptoms, and treatment of hypoglycemia and hyperglycemia, and foot care basics.   Diabetes Blitz:  -Group instruction provided by PowerPoint slides, verbal discussion, and written materials to support subject matter. The instructor gives an explanation and review of the physiology behind type 1 and type 2 diabetes, diabetes medications and rational behind using different medications, pre- and post-prandial blood glucose recommendations and Hemoglobin A1c goals, diabetes diet, and exercise including blood glucose guidelines for exercising safely.    Portion Distortion:  -Group instruction provided by PowerPoint slides, verbal discussion, written materials, and food models to support subject matter. The instructor gives an explanation of serving size versus portion size, changes in portions sizes over the last 20 years, and what consists of a serving from each food group.   Stress Management:  -Group instruction provided by verbal instruction, video, and written materials to support subject matter.  Instructors review role of stress in heart disease and how to cope with stress positively.     Exercising on Your Own:  -Group instruction  provided by verbal instruction, power point, and written materials to support subject.  Instructors discuss benefits of exercise, components of exercise, frequency and intensity of exercise, and end points for exercise.  Also discuss use of nitroglycerin and activating EMS.  Review options of places to exercise outside of rehab.  Review guidelines for sex with heart disease.   Cardiac Drugs I:  -Group instruction provided by verbal instruction and written materials to support subject.  Instructor reviews cardiac drug classes: antiplatelets, anticoagulants, beta blockers, and statins.  Instructor discusses reasons, side effects, and lifestyle considerations for each drug class.   Cardiac Drugs II:  -Group instruction provided by verbal instruction and written materials to support subject.  Instructor reviews cardiac drug classes: angiotensin converting enzyme inhibitors (ACE-I), angiotensin II receptor blockers (ARBs), nitrates, and calcium channel blockers.  Instructor discusses reasons, side effects, and lifestyle considerations for each drug class.   Anatomy and Physiology of the Circulatory System:  Group verbal and written instruction and models provide basic cardiac anatomy and physiology, with the coronary electrical and arterial systems. Review of: AMI, Angina, Valve disease, Heart Failure, Peripheral Artery Disease, Cardiac Arrhythmia, Pacemakers, and the ICD.   Other Education:  -Group or individual verbal, written, or video instructions that support the educational goals of the cardiac rehab program.   Holiday Eating Survival Tips:  -Group instruction provided by PowerPoint slides, verbal discussion, and written materials to support subject matter. The instructor gives patients tips, tricks, and techniques to help them not only survive but enjoy the holidays despite the onslaught of food that accompanies the holidays.   Knowledge Questionnaire Score: Knowledge Questionnaire Score -  07/21/18 0953    Knowledge Questionnaire Score          Pre Score  15/24           Core Components/Risk Factors/Patient Goals at Admission: Personal Goals and Risk Factors at Admission - 07/21/18 0957    Core Components/Risk Factors/Patient Goals on Admission           Weight Management  Yes;Weight Maintenance;Weight Loss    Intervention  Weight Management: Develop a combined nutrition and exercise program designed to reach desired caloric intake, while maintaining appropriate intake of nutrient and fiber,  sodium and fats, and appropriate energy expenditure required for the weight goal.;Weight Management: Provide education and appropriate resources to help participant work on and attain dietary goals.;Weight Management/Obesity: Establish reasonable short term and long term weight goals.    Admit Weight  162 lb 0.6 oz (73.5 kg)    Expected Outcomes  Short Term: Continue to assess and modify interventions until short term weight is achieved;Long Term: Adherence to nutrition and physical activity/exercise program aimed toward attainment of established weight goal;Weight Maintenance: Understanding of the daily nutrition guidelines, which includes 25-35% calories from fat, 7% or less cal from saturated fats, less than 200mg  cholesterol, less than 1.5gm of sodium, & 5 or more servings of fruits and vegetables daily;Weight Loss: Understanding of general recommendations for a balanced deficit meal plan, which promotes 1-2 lb weight loss per week and includes a negative energy balance of [phone] kcal/d;Understanding recommendations for meals to include 15-35% energy as protein, 25-35% energy from fat, 35-60% energy from carbohydrates, less than 200mg  of dietary cholesterol, 20-35 gm of total fiber daily;Understanding of distribution of calorie intake throughout the day with the consumption of 4-5 meals/snacks    Diabetes  Yes    Intervention  Provide education about signs/symptoms and action to take for  hypo/hyperglycemia.;Provide education about proper nutrition, including hydration, and aerobic/resistive exercise prescription along with prescribed medications to achieve blood glucose in normal ranges: Fasting glucose 65-99 mg/dL    Expected Outcomes  Short Term: Participant verbalizes understanding of the signs/symptoms and immediate care of hyper/hypoglycemia, proper foot care and importance of medication, aerobic/resistive exercise and nutrition plan for blood glucose control.;Long Term: Attainment of HbA1C < 7%.    Hypertension  Yes    Intervention  Provide education on lifestyle modifcations including regular physical activity/exercise, weight management, moderate sodium restriction and increased consumption of fresh fruit, vegetables, and low fat dairy, alcohol moderation, and smoking cessation.;Monitor prescription use compliance.    Expected Outcomes  Short Term: Continued assessment and intervention until BP is < 140/40mm HG in hypertensive participants. < 130/33mm HG in hypertensive participants with diabetes, heart failure or chronic kidney disease.;Long Term: Maintenance of blood pressure at goal levels.    Lipids  Yes    Intervention  Provide education and support for participant on nutrition & aerobic/resistive exercise along with prescribed medications to achieve LDL 70mg , HDL >40mg .    Expected Outcomes  Short Term: Participant states understanding of desired cholesterol values and is compliant with medications prescribed. Participant is following exercise prescription and nutrition guidelines.;Long Term: Cholesterol controlled with medications as prescribed, with individualized exercise RX and with personalized nutrition plan. Value goals: LDL < 70mg , HDL > 40 mg.    Stress  Yes    Intervention  Offer individual and/or small group education and counseling on adjustment to heart disease, stress management and health-related lifestyle change. Teach and support self-help strategies.;Refer  participants experiencing significant psychosocial distress to appropriate mental health specialists for further evaluation and treatment. When possible, include family members and significant others in education/counseling sessions.    Expected Outcomes  Short Term: Participant demonstrates changes in health-related behavior, relaxation and other stress management skills, ability to obtain effective social support, and compliance with psychotropic medications if prescribed.;Long Term: Emotional wellbeing is indicated by absence of clinically significant psychosocial distress or social isolation.           Core Components/Risk Factors/Patient Goals Review:    Core Components/Risk Factors/Patient Goals at Discharge (Final Review):    ITP Comments: ITP Comments  [place] Name 07/21/18 0834   ITP Comments  Dr. Fransico Him, Medical Director       Comments: Patient attended orientation from [phone] to [phone]  to review rules and guidelines for program. pt did not complete all portions of her [facility] packet.  Pt will be given opportunity at first scheduled exercise session. Completed 6 minute walk test, Intitial ITP, and exercise prescription.  VSS. Telemetry-sinus rhythm,   Asymptomatic.  Pt CBG stable today with exercise. However historically pt reports she has mid morning drop at home.  Pt instructed to add carbohydrate on way to cardiac rehab and will need to suspend pump during exercise.  Pt verbalized understanding.  Andi Hence, RN, BSN Cardiac Pulmonary Rehab

## 2018-07-31 ENCOUNTER — Encounter (HOSPITAL_COMMUNITY)
Admission: RE | Admit: 2018-07-31 | Discharge: 2018-07-31 | Disposition: A | Discharge status: Home / Self Care | Payer: Medicare Other | Source: Ambulatory Visit | Attending: Internal Medicine | Admitting: Internal Medicine

## 2018-07-31 ENCOUNTER — Ambulatory Visit (HOSPITAL_COMMUNITY): Payer: Medicare Other

## 2018-07-31 DIAGNOSIS — I214 Non-ST elevation (NSTEMI) myocardial infarction: Secondary | ICD-10-CM

## 2018-07-31 DIAGNOSIS — Z955 Presence of coronary angioplasty implant and graft: Secondary | ICD-10-CM

## 2018-07-31 LAB — GLUCOSE, CAPILLARY
Glucose-Capillary: 173 mg/dL — ABNORMAL HIGH (ref 70–99)
Glucose-Capillary: 185 mg/dL — ABNORMAL HIGH (ref 70–99)

## 2018-07-31 NOTE — Progress Notes (Signed)
Daily Session Note  Patient Details  Name: ENGLISH TOMER MRN: [id number] Date of Birth: 07/24/1946 Referring Provider:     CARDIAC REHAB PHASE II ORIENTATION from 07/21/2018 in [facility]  Referring Provider  Dr. Debara Pickett      Encounter Date: 07/31/2018  Check In: Session Check In - 07/31/18 1040      Check-In   Supervising physician immediately available to respond to emergencies  [facility] immediately available    Physician(s)  Dr. Tana Coast    Location  [facility]    Staff Present  Barnet Pall, RN, Deland Pretty, MS, ACSM CEP, Exercise Physiologist;Tyara Carol Ada, MS,ACSM CEP, Exercise Physiologist;Tara Karle Starch, RN, BSN    Medication changes reported      No    Fall or balance concerns reported     No    Tobacco Cessation  No Change    Warm-up and Cool-down  Performed as group-led instruction    Resistance Training Performed  Yes    VAD Patient?  No    PAD/SET Patient?  No      Pain Assessment   Currently in Pain?  No/denies       Capillary Blood Glucose: Results for orders placed or performed during the hospital encounter of 07/31/18 (from the past 24 hour(s))  Glucose, capillary     Status: Abnormal   Collection Time: 07/31/18 10:00 AM  Result Value Ref Range   Glucose-Capillary 173 (H) 70 - 99 mg/dL  Glucose, capillary     Status: Abnormal   Collection Time: 07/31/18 10:35 AM  Result Value Ref Range   Glucose-Capillary 185 (H) 70 - 99 mg/dL      Social History   Tobacco Use  Smoking Status Never Smoker  Smokeless Tobacco Never Used    Goals Met:  No report of cardiac concerns or symptoms  Goals Unmet:  Not Applicable  Comments: Mikiya started cardiac rehab today.  Pt tolerated light exercise without difficulty. VSS, telemetry-Sinus Rhythm, asymptomatic.  Medication list reconciled. Pt denies barriers to medicaiton compliance.  PSYCHOSOCIAL ASSESSMENT:  PHQ-1. Pt exhibits positive coping  skills, hopeful outlook with supportive family. No psychosocial needs identified at this time, no psychosocial interventions necessary.  Taralee did says she experiences feeling depressed at times due to her fibromyalgia.  Pt enjoys quilting.   Pt oriented to exercise equipment and routine.    Understanding verbalized.Barnet Pall, RN,BSN 07/31/2018 4:39 PM   Dr. Fransico Him is Medical Director for Cardiac Rehab at [facility]

## 2018-08-03 ENCOUNTER — Encounter (HOSPITAL_COMMUNITY)
Admission: RE | Admit: 2018-08-03 | Discharge: 2018-08-03 | Disposition: A | Discharge status: Home / Self Care | Payer: Medicare Other | Source: Ambulatory Visit | Attending: Internal Medicine | Admitting: Internal Medicine

## 2018-08-03 ENCOUNTER — Ambulatory Visit (HOSPITAL_COMMUNITY): Payer: Medicare Other

## 2018-08-03 DIAGNOSIS — I214 Non-ST elevation (NSTEMI) myocardial infarction: Secondary | ICD-10-CM | POA: Diagnosis not present

## 2018-08-03 DIAGNOSIS — Z955 Presence of coronary angioplasty implant and graft: Secondary | ICD-10-CM

## 2018-08-03 LAB — GLUCOSE, CAPILLARY
Glucose-Capillary: 236 mg/dL — ABNORMAL HIGH (ref 70–99)
Glucose-Capillary: 195 mg/dL — ABNORMAL HIGH (ref 70–99)

## 2018-08-07 ENCOUNTER — Ambulatory Visit (HOSPITAL_COMMUNITY): Payer: Medicare Other

## 2018-08-07 ENCOUNTER — Encounter (HOSPITAL_COMMUNITY)
Admission: RE | Admit: 2018-08-07 | Discharge: 2018-08-07 | Disposition: A | Discharge status: Home / Self Care | Payer: Medicare Other | Source: Ambulatory Visit | Attending: Internal Medicine | Admitting: Internal Medicine

## 2018-08-07 DIAGNOSIS — Z79899 Other long term (current) drug therapy: Secondary | ICD-10-CM | POA: Diagnosis not present

## 2018-08-07 DIAGNOSIS — E785 Hyperlipidemia, unspecified: Secondary | ICD-10-CM | POA: Insufficient documentation

## 2018-08-07 DIAGNOSIS — I214 Non-ST elevation (NSTEMI) myocardial infarction: Secondary | ICD-10-CM | POA: Diagnosis not present

## 2018-08-07 DIAGNOSIS — Z955 Presence of coronary angioplasty implant and graft: Secondary | ICD-10-CM

## 2018-08-07 DIAGNOSIS — I10 Essential (primary) hypertension: Secondary | ICD-10-CM | POA: Diagnosis not present

## 2018-08-07 DIAGNOSIS — Z7982 Long term (current) use of aspirin: Secondary | ICD-10-CM | POA: Diagnosis not present

## 2018-08-10 ENCOUNTER — Ambulatory Visit (HOSPITAL_COMMUNITY): Payer: Medicare Other

## 2018-08-10 ENCOUNTER — Encounter (HOSPITAL_COMMUNITY)
Admission: RE | Admit: 2018-08-10 | Discharge: 2018-08-10 | Disposition: A | Discharge status: Home / Self Care | Payer: Medicare Other | Source: Ambulatory Visit | Attending: Internal Medicine | Admitting: Internal Medicine

## 2018-08-10 DIAGNOSIS — Z955 Presence of coronary angioplasty implant and graft: Secondary | ICD-10-CM

## 2018-08-10 DIAGNOSIS — I214 Non-ST elevation (NSTEMI) myocardial infarction: Secondary | ICD-10-CM

## 2018-08-10 NOTE — Progress Notes (Signed)
Kelsey Pierce 73 y.o. female Nutrition Note Spoke with pt. Nutrition plan and goals reviewed with pt. Pt is following heart healthy diet. Pt wants to lose wt, has not been trying to lose weight actively since last conversation on 07/21/18. Diabetic, heart healthy weight loss tips reviewed (label reading, how to build a healthy plate, portion sizes, eating frequently across the day, carb counting). Since last encounter pt has started to batch cook and meal prep, this has helped her eat more regularly across the day. Praised pt and reviewed the benefits of eating consistently across the day to help manage blood sugar as well as to aid in weight loss and management of hunger. Pt has Type 1 Diabetes. Last A1c indicates blood glucose well-controlled. This Probation officer went over Diabetes Education test results. Pt checks CBG's 4 times a day. Fasting CBG's reportedly 55-190 mg/dL. This morning her fasting CBG was 159 at 8:00 am before breakfast. Reinforced the importance of eating regularly across the day with patient, and help her develop a set schedule for eating. Distributed additional meal prep and planning recipes for her to try. Since last meeting pt has tried 4 recipes and plans to incorporate them into her meal plan. Pt expressed understanding of the information reviewed.   No results found for: HGBA1C  Wt Readings from Last 3 Encounters:  07/21/18 162 lb 0.6 oz (73.5 kg)  07/15/18 161 lb 9.6 oz (73.3 kg)  06/19/18 155 lb 12.8 oz (70.7 kg)    Nutrition Diagnosis  Food-and nutrition-related knowledge deficit related to lack of exposure to information as related to diagnosis of: ? CVD ? Type 2 Diabetes  Overweight  related to excessive energy intake as evidenced by a BMI 29.56  Nutrition Intervention ? Pt's individual nutrition plan reviewed with pt. ? Benefits of continuing to adopting Heart Healthy diet discussed when [employer] reviewed.   Goal(s) ? Pt to identify, develop and use a set schedule  for eating ? Pt to identify food quantities necessary to achieve weight loss of 6-24 lbs. At graduation from cardiac rehab   Plan:   Pt to attend nutrition classes ? Nutrition I ? Nutrition II ? Portion Distortion   Will provide client-centered nutrition education as part of interdisciplinary care  Monitor and evaluate progress toward nutrition goal with team.    Laurina Bustle, MS, RD, LDN 08/10/2018 10:47 AM

## 2018-08-11 NOTE — Progress Notes (Signed)
Cardiac Individual Treatment Plan  Patient Details  Name: Kelsey Pierce MRN: [id number] Date of Birth: 01/02/1946 Referring Provider:     CARDIAC REHAB PHASE II ORIENTATION from 07/21/2018 in [facility]  Referring Provider  Dr. Debara Pickett      Initial Encounter Date:    CARDIAC REHAB PHASE II ORIENTATION from 07/21/2018 in [facility]  Date  07/21/18      Visit Diagnosis: 05/04/2018 NSTEMI (non-ST elevated myocardial infarction) ([place])  05/04/2018 Stented coronary artery  Patient's Home Medications on Admission:  Current Outpatient Medications:  .  ACCU-CHEK AVIVA PLUS test strip, , Disp: , Rfl:  .  ALPRAZolam (XANAX) 0.25 MG tablet, Take 0.25 mg by mouth 3 (three) times daily as needed for anxiety. , Disp: , Rfl:  .  amLODipine (NORVASC) 10 MG tablet, Take 1 tablet (10 mg total) by mouth daily., Disp: 90 tablet, Rfl: 3 .  aspirin EC 81 MG EC tablet, Take 1 tablet (81 mg total) by mouth daily., Disp: 30 tablet, Rfl: 0 .  Cholecalciferol (VITAMIN D) 2000 units tablet, Take 2,000 Units by mouth daily., Disp: , Rfl:  .  clobetasol ointment (TEMOVATE) 0.05 %, APPLY TOPICALLY 2 (TWO) TIMES A WEEK. USE TWICE WEEKLY AS NEEDED TO CONTROL SYMPTOMS. (Patient taking differently: Apply 1 application topically 4 (four) times daily as needed (lichen sclerosus flares). ), Disp: 30 g, Rfl: 0 .  DULoxetine (CYMBALTA) 30 MG capsule, Take 30 mg by mouth daily. , Disp: , Rfl:  .  ezetimibe (ZETIA) 10 MG tablet, Take 1 tablet (10 mg total) by mouth daily., Disp: 90 tablet, Rfl: 3 .  HYDROcodone-acetaminophen (NORCO/VICODIN) 5-325 MG per tablet, Take 1 tablet by mouth every 4 (four) hours as needed (pain). , Disp: , Rfl:  .  Insulin Human (INSULIN PUMP) SOLN, Inject into the skin See admin instructions. Use with Humalog, Disp: , Rfl:  .  latanoprost (XALATAN) 0.005 % ophthalmic solution, Place 1 drop into both eyes at bedtime., Disp: , Rfl:  .   metoprolol tartrate (LOPRESSOR) 25 MG tablet, Take 0.5 tablets (12.5 mg total) by mouth 2 (two) times daily., Disp: 60 tablet, Rfl: 0 .  nitroGLYCERIN (NITROSTAT) 0.4 MG SL tablet, Place 1 tablet (0.4 mg total) under the tongue every 5 (five) minutes x 3 doses as needed for chest pain., Disp: 30 tablet, Rfl: 0 .  rosuvastatin (CRESTOR) 20 MG tablet, Take 1 tablet (20 mg total) by mouth at bedtime., Disp: 90 tablet, Rfl: 3 .  sodium fluoride (DENTA 5000 PLUS) 1.1 % CREA dental cream, Place 1 application onto teeth at bedtime., Disp: , Rfl:  .  ticagrelor (BRILINTA) 90 MG TABS tablet, Take 1 tablet (90 mg total) by mouth 2 (two) times daily., Disp: 60 tablet, Rfl: 6 .  traZODone (DESYREL) 50 MG tablet, Take 50 mg by mouth at bedtime. , Disp: , Rfl:   Past Medical History: Past Medical History:  Diagnosis Date  . Adenomatous colon polyp 2014   repeat colon in 5 years  . Bilateral cataracts   . Chest pain    2D ECHO, 05/29/2011 -EF >55%, normal; MYOVIEW, 05/29/2011 - normal  . Depression   . Diabetes ([place])   . Fracture, foot june 2014   left  . Glaucoma   . Hyperlipidemia   . Hypertension   . Lichen sclerosus et atrophicus of the vulva 05/2014  . Neuropathy    legs  . PMB (postmenopausal bleeding)  Tobacco Use: Social History   Tobacco Use  Smoking Status Never Smoker  Smokeless Tobacco Never Used    Labs: Recent Review Scientist, physiological    Labs for ITP Cardiac and Pulmonary Rehab Latest Ref Rng & Units 05/22/2018   Cholestrol 100 - 199 mg/dL 124   LDLCALC 0 - 99 mg/dL 49   HDL >39 mg/dL 60   Trlycerides 0 - 149 mg/dL 75      Capillary Blood Glucose: Lab Results  Component Value Date   GLUCAP 195 (H) 08/03/2018   GLUCAP 236 (H) 08/03/2018   GLUCAP 185 (H) 07/31/2018   GLUCAP 173 (H) 07/31/2018   GLUCAP 270 (H) 05/05/2018     Exercise Target Goals: Exercise Program Goal: Individual exercise prescription set using results from initial 6 min walk test and THRR  while considering  patient's activity barriers and safety.   Exercise Prescription Goal: Initial exercise prescription builds to 30-45 minutes a day of aerobic activity, 2-3 days per week.  Home exercise guidelines will be given to patient during program as part of exercise prescription that the participant will acknowledge.  Activity Barriers & Risk Stratification: Activity Barriers & Cardiac Risk Stratification - 07/21/18 0951      Activity Barriers & Cardiac Risk Stratification   Activity Barriers  Deconditioning;Muscular Weakness    Cardiac Risk Stratification  High       6 Minute Walk: 6 Minute Walk    Row Name 07/21/18 0948         6 Minute Walk   Phase  Initial     Distance  1400 feet     Walk Time  6 minutes     # of Rest Breaks  0     MPH  2.65     METS  2.65     RPE  11     VO2 Peak  9.28     Symptoms  No     Resting HR  63 bpm     Resting BP  140/64     Resting Oxygen Saturation   100 %     Exercise Oxygen Saturation  during 6 min walk  97 %     Max Ex. HR  86 bpm     Max Ex. BP  142/70     2 Minute Post BP  104/60        Oxygen Initial Assessment:   Oxygen Re-Evaluation:   Oxygen Discharge (Final Oxygen Re-Evaluation):   Initial Exercise Prescription: Initial Exercise Prescription - 07/21/18 1100      Date of Initial Exercise RX and Referring Provider   Date  07/21/18    Referring Provider  Dr. Debara Pickett    Expected Discharge Date  10/31/18      NuStep   Level  3    SPM  85    Minutes  10    METs  2.7      Arm Ergometer   Level  1    Watts  33    Minutes  10    METs  3.2      Track   Laps  9    Minutes  10    METs  2.53      Prescription Details   Frequency (times per week)  3    Duration  Progress to 30 minutes of continuous aerobic without signs/symptoms of physical distress      Intensity   THRR 40-80% of Max Heartrate  58-117  Ratings of Perceived Exertion  11-13      Progression   Progression  Continue to progress  workloads to maintain intensity without signs/symptoms of physical distress.      Resistance Training   Training Prescription  Yes    Weight  3 lbs.     Reps  10-15       Perform Capillary Blood Glucose checks as needed.  Exercise Prescription Changes:  Exercise Prescription Changes    Row Name 07/31/18 1006 08/03/18 0955           Response to Exercise   Blood Pressure (Admit)  98/60 recheck BP 122/70  132/72      Blood Pressure (Exercise)  142/60  142/64      Blood Pressure (Exit)  130/70  122/60      Heart Rate (Admit)  56 bpm  63 bpm      Heart Rate (Exercise)  91 bpm  104 bpm      Heart Rate (Exit)  66 bpm  69 bpm      Rating of Perceived Exertion (Exercise)  13  12      Symptoms  none  none      Comments  late start  -      Duration  Progress to 30 minutes of  aerobic without signs/symptoms of physical distress  Progress to 30 minutes of  aerobic without signs/symptoms of physical distress      Intensity  THRR unchanged  THRR unchanged        Progression   Progression  Continue to progress workloads to maintain intensity without signs/symptoms of physical distress.  Continue to progress workloads to maintain intensity without signs/symptoms of physical distress.      Average METs  -  2.6        Resistance Training   Training Prescription  Yes  Yes      Weight  1lbs  3 lbs.       Reps  10-15  10-15      Time  10 Minutes  10 Minutes        Interval Training   Interval Training  No  No        NuStep   Level  -  3      SPM  -  85      Minutes  -  10      METs  -  2.2        Arm Ergometer   Level  1  1      Watts  -  33      Minutes  10  10        Track   Laps  15  12      Minutes  10  10      METs  3.6  3.09         Exercise Comments:  Exercise Comments    Row Name 08/03/18 1024           Exercise Comments  Reviewed METs with patient.          Exercise Goals and Review:  Exercise Goals    Row Name 07/21/18 0951             Exercise  Goals   Increase Physical Activity  Yes       Intervention  Provide advice, education, support and counseling about physical activity/exercise needs.;Develop an individualized exercise prescription for aerobic and resistive training based on initial  evaluation findings, risk stratification, comorbidities and participant's personal goals.       Expected Outcomes  Short Term: Attend rehab on a regular basis to increase amount of physical activity.       Increase Strength and Stamina  Yes       Intervention  Provide advice, education, support and counseling about physical activity/exercise needs.;Develop an individualized exercise prescription for aerobic and resistive training based on initial evaluation findings, risk stratification, comorbidities and participant's personal goals.       Expected Outcomes  Short Term: Increase workloads from initial exercise prescription for resistance, speed, and METs.       Able to understand and use rate of perceived exertion (RPE) scale  Yes       Intervention  Provide education and explanation on how to use RPE scale       Expected Outcomes  Short Term: Able to use RPE daily in rehab to express subjective intensity level;Long Term:  Able to use RPE to guide intensity level when exercising independently       Knowledge and understanding of Target Heart Rate Range (THRR)  Yes       Intervention  Provide education and explanation of THRR including how the numbers were predicted and where they are located for reference       Expected Outcomes  Short Term: Able to state/look up THRR;Long Term: Able to use THRR to govern intensity when exercising independently;Short Term: Able to use daily as guideline for intensity in rehab       Able to check pulse independently  Yes       Intervention  Provide education and demonstration on how to check pulse in carotid and radial arteries.;Review the importance of being able to check your own pulse for safety during independent  exercise       Expected Outcomes  Short Term: Able to explain why pulse checking is important during independent exercise;Long Term: Able to check pulse independently and accurately       Understanding of Exercise Prescription  Yes       Intervention  Provide education, explanation, and written materials on patient's individual exercise prescription       Expected Outcomes  Short Term: Able to explain program exercise prescription;Long Term: Able to explain home exercise prescription to exercise independently          Exercise Goals Re-Evaluation :   Discharge Exercise Prescription (Final Exercise Prescription Changes): Exercise Prescription Changes - 08/03/18 0955      Response to Exercise   Blood Pressure (Admit)  132/72    Blood Pressure (Exercise)  142/64    Blood Pressure (Exit)  122/60    Heart Rate (Admit)  63 bpm    Heart Rate (Exercise)  104 bpm    Heart Rate (Exit)  69 bpm    Rating of Perceived Exertion (Exercise)  12    Symptoms  none    Duration  Progress to 30 minutes of  aerobic without signs/symptoms of physical distress    Intensity  THRR unchanged      Progression   Progression  Continue to progress workloads to maintain intensity without signs/symptoms of physical distress.    Average METs  2.6      Resistance Training   Training Prescription  Yes    Weight  3 lbs.     Reps  10-15    Time  10 Minutes      Interval Training   Interval Training  No      NuStep   Level  3    SPM  85    Minutes  10    METs  2.2      Arm Ergometer   Level  1    Watts  33    Minutes  10      Track   Laps  12    Minutes  10    METs  3.09       Nutrition:  Target Goals: Understanding of nutrition guidelines, daily intake of sodium 1500mg , cholesterol 200mg , calories 30% from fat and 7% or less from saturated fats, daily to have 5 or more servings of fruits and vegetables.  Biometrics: Pre Biometrics - 07/21/18 0949      Pre Biometrics   Height  5\' 3"  (1.6  m)    Weight  162 lb 0.6 oz (73.5 kg)    Waist Circumference  36 inches    Hip Circumference  46 inches    Waist to Hip Ratio  0.78 %    BMI (Calculated)  28.71    Triceps Skinfold  34 mm    % Body Fat  28.7 %    Grip Strength  23 kg    Flexibility  14.5 in    Single Leg Stand  4.31 seconds        Nutrition Therapy Plan and Nutrition Goals: Nutrition Therapy & Goals - 07/21/18 0946      Nutrition Therapy   Diet  heart healthy, diabetic       Personal Nutrition Goals   Nutrition Goal  Pt to identify, develop, and use a set schedule for eating    Personal Goal #2  Pt to identify food quantities necessary to achieve weight loss of 6-24 lbs. at graduation from cardiac rehab      [place], educate and counsel regarding individualized specific dietary modifications aiming towards targeted core components such as weight, hypertension, lipid management, diabetes, heart failure and other comorbidities.    Expected Outcomes  Short Term Goal: Understand basic principles of dietary content, such as calories, fat, sodium, cholesterol and nutrients.;Long Term Goal: Adherence to prescribed nutrition plan.       Nutrition Assessments: Nutrition Assessments - 08/10/18 1053      MEDFICTS Scores   Pre Score  6       Nutrition Goals Re-Evaluation:   Nutrition Goals Re-Evaluation:   Nutrition Goals Discharge (Final Nutrition Goals Re-Evaluation):   Psychosocial: Target Goals: Acknowledge presence or absence of significant depression and/or stress, maximize coping skills, provide positive support system. Participant is able to verbalize types and ability to use techniques and skills needed for reducing stress and depression.  Initial Review & Psychosocial Screening: Initial Psych Review & Screening - 08/11/18 1542      Initial Review   Current issues with  History of Depression;Current Stress Concerns    Source of Stress Concerns  Chronic  Illness;Family      Family Dynamics   Good Support System?  Yes   Katharina has her family for support     Barriers   Psychosocial barriers to participate in program  The patient should benefit from training in stress management and relaxation.      Screening Interventions   Interventions  Encouraged to exercise    Expected Outcomes  Long Term Goal: Stressors or current issues are controlled or eliminated.;Short Term goal: Utilizing psychosocial counselor, staff and physician to assist with  identification of specific Stressors or current issues interfering with healing process. Setting desired goal for each stressor or current issue identified.;Short Term goal: Identification and review with participant of any Quality of Life or Depression concerns found by scoring the questionnaire.;Long Term goal: The participant improves quality of Life and PHQ9 Scores as seen by post scores and/or verbalization of changes       Quality of Life Scores: Quality of Life - 07/21/18 0955      Quality of Life   Select  Quality of Life      Quality of Life Scores   Health/Function Pre  22.13 %    Socioeconomic Pre  16.58 %    Psych/Spiritual Pre  23.71 %    Family Pre  30 %    GLOBAL Pre  22.45 %      Scores of 19 and below usually indicate a poorer quality of life in these areas.  A difference of  2-3 points is a clinically meaningful difference.  A difference of 2-3 points in the total score of the Quality of Life Index has been associated with significant improvement in overall quality of life, self-image, physical symptoms, and general health in studies assessing change in quality of life.  PHQ-9: Recent Review Flowsheet Data    Depression screen [facility] 2/9 07/31/2018   Decreased Interest 1   Down, Depressed, Hopeless 0   PHQ - 2 Score 1     Interpretation of Total Score  Total Score Depression Severity:  1-4 = Minimal depression, 5-9 = Mild depression, 10-14 = Moderate depression, 15-19 =  Moderately severe depression, 20-27 = Severe depression   Psychosocial Evaluation and Intervention:   Psychosocial Re-Evaluation: Psychosocial Re-Evaluation    [place] Name 08/11/18 1540             Psychosocial Re-Evaluation   Current issues with  Current Stress Concerns       Interventions  Stress management education       Continue Psychosocial Services   No Follow up required         Initial Review   Source of Stress Concerns  Chronic Illness          Psychosocial Discharge (Final Psychosocial Re-Evaluation): Psychosocial Re-Evaluation - 08/11/18 1540      Psychosocial Re-Evaluation   Current issues with  Current Stress Concerns    Interventions  Stress management education    Continue Psychosocial Services   No Follow up required      Initial Review   Source of Stress Concerns  Chronic Illness       Vocational Rehabilitation: Provide vocational rehab assistance to qualifying candidates.   Vocational Rehab Evaluation & Intervention: Vocational Rehab - 07/21/18 1146      Initial Vocational Rehab Evaluation & Intervention   Assessment shows need for Vocational Rehabilitation  No   retired       Education: Education Goals: Education classes will be provided on a weekly basis, covering required topics. Participant will state understanding/return demonstration of topics presented.  Learning Barriers/Preferences: Learning Barriers/Preferences - 07/21/18 0957      Learning Barriers/Preferences   Learning Barriers  Sight   Glaucoma   Learning Preferences  Written Material;Individual Instruction;Group Instruction       Education Topics: Count Your Pulse:  -Group instruction provided by verbal instruction, demonstration, patient participation and written materials to support subject.  Instructors address importance of being able to find your pulse and how to count your pulse when at home without  a heart monitor.  Patients get hands on experience counting their  pulse with staff help and individually.   Heart Attack, Angina, and Risk Factor Modification:  -Group instruction provided by verbal instruction, video, and written materials to support subject.  Instructors address signs and symptoms of angina and heart attacks.    Also discuss risk factors for heart disease and how to make changes to improve heart health risk factors.   Functional Fitness:  -Group instruction provided by verbal instruction, demonstration, patient participation, and written materials to support subject.  Instructors address safety measures for doing things around the house.  Discuss how to get up and down off the floor, how to pick things up properly, how to safely get out of a chair without assistance, and balance training.   Meditation and Mindfulness:  -Group instruction provided by verbal instruction, patient participation, and written materials to support subject.  Instructor addresses importance of mindfulness and meditation practice to help reduce stress and improve awareness.  Instructor also leads participants through a meditation exercise.    Stretching for Flexibility and Mobility:  -Group instruction provided by verbal instruction, patient participation, and written materials to support subject.  Instructors lead participants through series of stretches that are designed to increase flexibility thus improving mobility.  These stretches are additional exercise for major muscle groups that are typically performed during regular warm up and cool down.   Hands Only CPR:  -Group verbal, video, and participation provides a basic overview of AHA guidelines for community CPR. Role-play of emergencies allow participants the opportunity to practice calling for help and chest compression technique with discussion of AED use.   Hypertension: -Group verbal and written instruction that provides a basic overview of hypertension including the most recent diagnostic guidelines,  risk factor reduction with self-care instructions and medication management.    Nutrition I class: Heart Healthy Eating:  -Group instruction provided by PowerPoint slides, verbal discussion, and written materials to support subject matter. The instructor gives an explanation and review of the Therapeutic Lifestyle Changes diet recommendations, which includes a discussion on lipid goals, dietary fat, sodium, fiber, plant stanol/sterol esters, sugar, and the components of a well-balanced, healthy diet.   Nutrition II class: Lifestyle Skills:  -Group instruction provided by PowerPoint slides, verbal discussion, and written materials to support subject matter. The instructor gives an explanation and review of label reading, grocery shopping for heart health, heart healthy recipe modifications, and ways to make healthier choices when eating out.   Diabetes Question & Answer:  -Group instruction provided by PowerPoint slides, verbal discussion, and written materials to support subject matter. The instructor gives an explanation and review of diabetes co-morbidities, pre- and post-prandial blood glucose goals, pre-exercise blood glucose goals, signs, symptoms, and treatment of hypoglycemia and hyperglycemia, and foot care basics.   Diabetes Blitz:  -Group instruction provided by PowerPoint slides, verbal discussion, and written materials to support subject matter. The instructor gives an explanation and review of the physiology behind type 1 and type 2 diabetes, diabetes medications and rational behind using different medications, pre- and post-prandial blood glucose recommendations and Hemoglobin A1c goals, diabetes diet, and exercise including blood glucose guidelines for exercising safely.    Portion Distortion:  -Group instruction provided by PowerPoint slides, verbal discussion, written materials, and food models to support subject matter. The instructor gives an explanation of serving size versus  portion size, changes in portions sizes over the last 20 years, and what consists of a serving from each food group.  Stress Management:  -Group instruction provided by verbal instruction, video, and written materials to support subject matter.  Instructors review role of stress in heart disease and how to cope with stress positively.     Exercising on Your Own:  -Group instruction provided by verbal instruction, power point, and written materials to support subject.  Instructors discuss benefits of exercise, components of exercise, frequency and intensity of exercise, and end points for exercise.  Also discuss use of nitroglycerin and activating EMS.  Review options of places to exercise outside of rehab.  Review guidelines for sex with heart disease.   Cardiac Drugs I:  -Group instruction provided by verbal instruction and written materials to support subject.  Instructor reviews cardiac drug classes: antiplatelets, anticoagulants, beta blockers, and statins.  Instructor discusses reasons, side effects, and lifestyle considerations for each drug class.   Cardiac Drugs II:  -Group instruction provided by verbal instruction and written materials to support subject.  Instructor reviews cardiac drug classes: angiotensin converting enzyme inhibitors (ACE-I), angiotensin II receptor blockers (ARBs), nitrates, and calcium channel blockers.  Instructor discusses reasons, side effects, and lifestyle considerations for each drug class.   Anatomy and Physiology of the Circulatory System:  Group verbal and written instruction and models provide basic cardiac anatomy and physiology, with the coronary electrical and arterial systems. Review of: AMI, Angina, Valve disease, Heart Failure, Peripheral Artery Disease, Cardiac Arrhythmia, Pacemakers, and the ICD.   Other Education:  -Group or individual verbal, written, or video instructions that support the educational goals of the cardiac rehab  program.   Holiday Eating Survival Tips:  -Group instruction provided by PowerPoint slides, verbal discussion, and written materials to support subject matter. The instructor gives patients tips, tricks, and techniques to help them not only survive but enjoy the holidays despite the onslaught of food that accompanies the holidays.   Knowledge Questionnaire Score: Knowledge Questionnaire Score - 07/21/18 0953      Knowledge Questionnaire Score   Pre Score  15/24       Core Components/Risk Factors/Patient Goals at Admission: Personal Goals and Risk Factors at Admission - 07/21/18 0957      Core Components/Risk Factors/Patient Goals on Admission    Weight Management  Yes;Weight Maintenance;Weight Loss    Intervention  Weight Management: Develop a combined nutrition and exercise program designed to reach desired caloric intake, while maintaining appropriate intake of nutrient and fiber, sodium and fats, and appropriate energy expenditure required for the weight goal.;Weight Management: Provide education and appropriate resources to help participant work on and attain dietary goals.;Weight Management/Obesity: Establish reasonable short term and long term weight goals.    Admit Weight  162 lb 0.6 oz (73.5 kg)    Expected Outcomes  Short Term: Continue to assess and modify interventions until short term weight is achieved;Long Term: Adherence to nutrition and physical activity/exercise program aimed toward attainment of established weight goal;Weight Maintenance: Understanding of the daily nutrition guidelines, which includes 25-35% calories from fat, 7% or less cal from saturated fats, less than 200mg  cholesterol, less than 1.5gm of sodium, & 5 or more servings of fruits and vegetables daily;Weight Loss: Understanding of general recommendations for a balanced deficit meal plan, which promotes 1-2 lb weight loss per week and includes a negative energy balance of [phone] kcal/d;Understanding  recommendations for meals to include 15-35% energy as protein, 25-35% energy from fat, 35-60% energy from carbohydrates, less than 200mg  of dietary cholesterol, 20-35 gm of total fiber daily;Understanding of distribution of calorie intake throughout the  day with the consumption of 4-5 meals/snacks    Diabetes  Yes    Intervention  Provide education about signs/symptoms and action to take for hypo/hyperglycemia.;Provide education about proper nutrition, including hydration, and aerobic/resistive exercise prescription along with prescribed medications to achieve blood glucose in normal ranges: Fasting glucose 65-99 mg/dL    Expected Outcomes  Short Term: Participant verbalizes understanding of the signs/symptoms and immediate care of hyper/hypoglycemia, proper foot care and importance of medication, aerobic/resistive exercise and nutrition plan for blood glucose control.;Long Term: Attainment of HbA1C < 7%.    Hypertension  Yes    Intervention  Provide education on lifestyle modifcations including regular physical activity/exercise, weight management, moderate sodium restriction and increased consumption of fresh fruit, vegetables, and low fat dairy, alcohol moderation, and smoking cessation.;Monitor prescription use compliance.    Expected Outcomes  Short Term: Continued assessment and intervention until BP is < 140/41mm HG in hypertensive participants. < 130/62mm HG in hypertensive participants with diabetes, heart failure or chronic kidney disease.;Long Term: Maintenance of blood pressure at goal levels.    Lipids  Yes    Intervention  Provide education and support for participant on nutrition & aerobic/resistive exercise along with prescribed medications to achieve LDL 70mg , HDL >40mg .    Expected Outcomes  Short Term: Participant states understanding of desired cholesterol values and is compliant with medications prescribed. Participant is following exercise prescription and nutrition guidelines.;Long  Term: Cholesterol controlled with medications as prescribed, with individualized exercise RX and with personalized nutrition plan. Value goals: LDL < 70mg , HDL > 40 mg.    Stress  Yes    Intervention  Offer individual and/or small group education and counseling on adjustment to heart disease, stress management and health-related lifestyle change. Teach and support self-help strategies.;Refer participants experiencing significant psychosocial distress to appropriate mental health specialists for further evaluation and treatment. When possible, include family members and significant others in education/counseling sessions.    Expected Outcomes  Short Term: Participant demonstrates changes in health-related behavior, relaxation and other stress management skills, ability to obtain effective social support, and compliance with psychotropic medications if prescribed.;Long Term: Emotional wellbeing is indicated by absence of clinically significant psychosocial distress or social isolation.       Core Components/Risk Factors/Patient Goals Review:  Goals and Risk Factor Review    Row Name 08/11/18 1545             Core Components/Risk Factors/Patient Goals Review   Personal Goals Review  Weight Management/Obesity;Lipids;Hypertension;Diabetes;Stress       Review  Shanyah is off to a good start to exercise. Rena's vital signs and CBG's have been stable at cardiac rehab. Chaney has been suspending her insulin pump during exercise.       Expected Outcomes  Jozelyn will continue to participate in phase 2 cardiac rehab for exercise, nutrition and lifestyle modifications          Core Components/Risk Factors/Patient Goals at Discharge (Final Review):  Goals and Risk Factor Review - 08/11/18 1545      Core Components/Risk Factors/Patient Goals Review   Personal Goals Review  Weight Management/Obesity;Lipids;Hypertension;Diabetes;Stress    Review  Kyran is off to a good start to exercise. Letticia's  vital signs and CBG's have been stable at cardiac rehab. Saylee has been suspending her insulin pump during exercise.    Expected Outcomes  Candida will continue to participate in phase 2 cardiac rehab for exercise, nutrition and lifestyle modifications       ITP Comments: ITP Comments  [place] Name 07/21/18 [phone] 08/11/18 1528         ITP Comments  Dr. Fransico Him, Medical Director   30 Day ITP Review.  Darlys is off to a good start to exercise         Comments: See ITP comments.Barnet Pall, RN,BSN 08/13/2018 11:07 AM

## 2018-08-12 ENCOUNTER — Ambulatory Visit (HOSPITAL_COMMUNITY): Payer: Medicare Other

## 2018-08-12 ENCOUNTER — Encounter (HOSPITAL_COMMUNITY): Payer: Medicare Other

## 2018-08-12 ENCOUNTER — Telehealth (HOSPITAL_COMMUNITY): Payer: Self-pay | Admitting: Internal Medicine

## 2018-08-14 ENCOUNTER — Ambulatory Visit (HOSPITAL_COMMUNITY): Payer: Medicare Other

## 2018-08-14 ENCOUNTER — Encounter (HOSPITAL_COMMUNITY): Payer: Medicare Other

## 2018-08-17 ENCOUNTER — Encounter (HOSPITAL_COMMUNITY)
Admission: RE | Admit: 2018-08-17 | Discharge: 2018-08-17 | Disposition: A | Discharge status: Home / Self Care | Payer: Medicare Other | Source: Ambulatory Visit | Attending: Internal Medicine | Admitting: Internal Medicine

## 2018-08-17 ENCOUNTER — Ambulatory Visit (HOSPITAL_COMMUNITY): Payer: Medicare Other

## 2018-08-17 DIAGNOSIS — I214 Non-ST elevation (NSTEMI) myocardial infarction: Secondary | ICD-10-CM | POA: Diagnosis not present

## 2018-08-17 DIAGNOSIS — Z955 Presence of coronary angioplasty implant and graft: Secondary | ICD-10-CM

## 2018-08-19 ENCOUNTER — Encounter (HOSPITAL_COMMUNITY): Payer: Medicare Other

## 2018-08-19 ENCOUNTER — Ambulatory Visit (HOSPITAL_COMMUNITY): Payer: Medicare Other

## 2018-08-21 ENCOUNTER — Encounter (HOSPITAL_COMMUNITY): Payer: Medicare Other

## 2018-08-21 ENCOUNTER — Ambulatory Visit: Payer: Medicare Other | Admitting: Internal Medicine

## 2018-08-21 ENCOUNTER — Ambulatory Visit (HOSPITAL_COMMUNITY): Payer: Medicare Other

## 2018-08-21 ENCOUNTER — Encounter: Payer: Self-pay | Admitting: Internal Medicine

## 2018-08-21 VITALS — BP 136/70 | HR 59 | Ht 62.0 in | Wt 166.0 lb

## 2018-08-21 DIAGNOSIS — I10 Essential (primary) hypertension: Secondary | ICD-10-CM | POA: Diagnosis not present

## 2018-08-21 DIAGNOSIS — I2510 Atherosclerotic heart disease of native coronary artery without angina pectoris: Secondary | ICD-10-CM | POA: Diagnosis not present

## 2018-08-21 DIAGNOSIS — E785 Hyperlipidemia, unspecified: Secondary | ICD-10-CM | POA: Diagnosis not present

## 2018-08-21 DIAGNOSIS — Z79899 Other long term (current) drug therapy: Secondary | ICD-10-CM

## 2018-08-21 MED ORDER — HYDROCHLOROTHIAZIDE 12.5 MG PO CAPS: 12.5000 mg | ORAL_CAPSULE | Freq: Every day | ORAL | 3 refills | Status: DC

## 2018-08-21 NOTE — Progress Notes (Signed)
OFFICE NOTE  Chief Complaint:  Follow-up coronary disease  Primary Care Physician: Burnard Bunting, MD  HPI:  Kelsey Pierce  is a 73 year old female with a history of epigastric pain, a systolic murmur, which on echo there was only trace amount of MR and mild TR. She also had mild concentric LVH and left atrial enlargement with a normal LV systolic function. There was not  impaired diastolic dysfunction. I have talked about some of the signs of hypertensive heart disease and felt that we should treat her more aggressively for her blood pressure and recommended starting lisinopril 20 mg daily. She has since followed up with Altha Harm, our clinical pharmacist, who had added 5 mg of amlodipine for blood pressures that remained elevated. I did review a two month blood pressure chart from her, which shows very good control of her blood pressure between 341 to 962 systolic. Heart rate has ranged basically in the 60s and low 70s.   Mrs. Wassenaar returns today for followup. Symptomatically, she is doing very well. Denies any chest pain, worsening shortness of breath, or any associated problems. She is overdue for an echocardiogram to evaluate for change in her valvular heart disease.  05/20/2016  Mrs. Bies was seen today for follow-up. At her last office visit I repeated an echocardiogram as she had a louder systolic murmur. LVEF was 65-70% with grade 1 diastolic dysfunction and very mild aortic stenosis with a mean gradient of 8 mmHg. There was a questionable mobile density in the right atrium. A CT angiogram was recommended. This was performed on 06/19/2015. No mobile density was noted, however she was found to have coronary artery disease. Specifically her coronary artery calcium score was 678. There was moderate coronary disease in the proximal LAD and RCA as well as mild concentric LVH. She was asymptomatic the time and no further testing was undertaken however she reports over the past  several months she's become progressively more fatigue, particularly with exertion. She also has less energy overall and does get short of breath doing things like raking leaves. She denies any chest pain. She reports poor sleep at night however this is a chronic problem.  07/04/2016  Mrs. Hughley was seen back today in follow-up. She underwent a nuclear stress test which is negative for ischemia. She notes an improvement in her fatigue, particularly after discontinuing her lisinopril. She was found to have some acute renal insufficiency and hyperkalemia. The lisinopril was stopped to allow for this and she says that she feels better. It may be whatever caused her to have some renal dysfunction was the culprit for her symptoms. I subsequently increased her amlodipine to 10 mg to compensate for her blood pressure change although blood pressure is mildly elevated today and she notes more leg swelling.  12/16/2016  Mrs. Dahan was seen today in follow-up. Overall seems to be doing pretty well. She denies any worsening shortness of breath. She reports walking regularly without symptoms of chest pain or dyspnea. Blood pressure control is much better. Initially was 156/76 but a recheck blood pressure came down to 130/56. She's not had any significant swelling. She does report she takes chlorthalidone at night. She gets up usually once a night to urinate and this may be improved by taking the medicine the morning. Overall her morning blood pressures around 229 systolic. She does have mild aortic stenosis by echo in 2016. A stress test last year showed no ischemia with an EF of 68%.  12/02/2017  Samyrah returns  today for follow-up.  Overall she is without complaints.  She brought a list of blood pressures from home which indicate generally her blood pressure in the 938B and 017P systolic.  At times she has been up to 165.  I do not see any blood pressures in the 120 range.  Today her blood pressure in the office  was 160/62.  She had labs with Dr. Reynaldo Minium last in June 2018.  Her total cholesterol is 175, HDL 71, LDL 90 and triglycerides 68.  Given her history of diabetes with cardiovascular risk, her goal LDL should be less than 70.  She is currently on rosuvastatin 20 mg daily.  05/19/2018  Saarah is seen today in hospital follow-up.  Unfortunately she presented with chest pain concerning for unstable angina in the end of September.  Previously I had further adjusted her blood pressure and cholesterol medications.  This chest discomfort radiated through to her mid back.  She did rule in for non-STEMI with elevated troponin up to 0.23.  Ultimately she underwent left heart catheterization on 05/04/2018 which showed 95% mid RCA stenosis treated with a [place] 28 x 3 mm stent postdilated to 3.5 mm and there was additional 50 to 70% mid LAD stenosis with a diagonal vessel that was 70% stenotic.  LVEDP was normal.  LVEF was normal.  Since discharge she is done well without any worsening shortness of breath or recurrent chest pain.  Aggressive medical therapy was recommended.  She has been on aspirin and Brilinta but notes she does get shortness of breath after taking her Brilinta which is improves with caffeine.  Her lipid profile was not reassessed during hospitalization.  Despite adding Zetia in April, her last labs from PCP were in July which showed an LDL of 85, still not at goal LDL less than 70.  She is also long-standing insulin-dependent diabetic but is not on Jardiance which is independent cardiovascular risk reduction in diabetics.  08/21/2018  Aubrie is seen today in follow-up.  She is doing well after her PCI in September.  She has had some difficulty with elevated liver enzymes requiring discontinuation of high intensity statin.  She is now on rosuvastatin 20 mg daily.  A repeat lipid profile is planned for February.  She is also had some lower extremity edema.  She was taken off of her HCTZ.  He is on high  dose amlodipine.  She denies any chest pain.  She is participating cardiac rehab without difficulty.  PMHx:  Past Medical History:  Diagnosis Date  . Adenomatous colon polyp 2014   repeat colon in 5 years  . Bilateral cataracts   . Chest pain    2D ECHO, 05/29/2011 -EF >55%, normal; MYOVIEW, 05/29/2011 - normal  . Depression   . Diabetes ([place])   . Fracture, foot june 2014   left  . Glaucoma   . Hyperlipidemia   . Hypertension   . Lichen sclerosus et atrophicus of the vulva 05/2014  . Neuropathy    legs  . PMB (postmenopausal bleeding)     Past Surgical History:  Procedure Laterality Date  . CARPAL TUNNEL RELEASE Bilateral   . CATARACT EXTRACTION W/ INTRAOCULAR LENS IMPLANT Bilateral 06/2015  . CESAREAN SECTION  1974  . CHOLECYSTECTOMY  1969  . CORONARY STENT INTERVENTION N/A 05/04/2018   Procedure: CORONARY STENT INTERVENTION;  Surgeon: Belva Crome, MD;  Location: [facility];  Service: Cardiovascular;  Laterality: N/A;  . ENDOMETRIAL BIOPSY  09-30-97   Dr  [employer]  . HYSTEROSCOPY  02-10-97   D&C, polyp--focal hyperplasia w/o atypia  . LEFT HEART CATH AND CORONARY ANGIOGRAPHY N/A 05/04/2018   Procedure: LEFT HEART CATH AND CORONARY ANGIOGRAPHY;  Surgeon: Belva Crome, MD;  Location: [facility];  Service: Cardiovascular;  Laterality: N/A;  . TUBAL LIGATION Bilateral     FAMHx:  Family History  Problem Relation Age of Onset  . Cancer Mother        Lung cancer  . Stroke Father 65  . Heart disease Father   . Hypertension Father   . Cancer Brother        Liver cancer  . Cancer Maternal Grandmother        Colon cancer  . Colon cancer Maternal Grandmother 17  . Heart attack Maternal Grandfather   . Heart attack Paternal Grandmother   . Alzheimer's disease Paternal Grandfather   . Heart disease Brother 62    SOCHx:   reports that she has never smoked. She has never used smokeless tobacco. She reports that she does not drink alcohol or use  drugs.  ALLERGIES:  Allergies  Allergen Reactions  . Erythromycin Other (See Comments)    Abdominal pain  . Lisinopril Other (See Comments)    Stopped due to acute kidney injury and hyperkalemia    ROS: Pertinent items noted in HPI and remainder of comprehensive ROS otherwise negative.  HOME MEDS: Current Outpatient Medications  Medication Sig Dispense Refill  . ACCU-CHEK AVIVA PLUS test strip     . ALPRAZolam (XANAX) 0.25 MG tablet Take 0.25 mg by mouth 3 (three) times daily as needed for anxiety.     Marland Kitchen amLODipine (NORVASC) 10 MG tablet Take 1 tablet (10 mg total) by mouth daily. 90 tablet 3  . aspirin EC 81 MG EC tablet Take 1 tablet (81 mg total) by mouth daily. 30 tablet 0  . Cholecalciferol (VITAMIN D) 2000 units tablet Take 2,000 Units by mouth daily.    . clobetasol ointment (TEMOVATE) 0.05 % APPLY TOPICALLY 2 (TWO) TIMES A WEEK. USE TWICE WEEKLY AS NEEDED TO CONTROL SYMPTOMS. (Patient taking differently: Apply 1 application topically 4 (four) times daily as needed (lichen sclerosus flares). ) 30 g 0  . DULoxetine (CYMBALTA) 30 MG capsule Take 30 mg by mouth daily.     Marland Kitchen HYDROcodone-acetaminophen (NORCO/VICODIN) 5-325 MG per tablet Take 1 tablet by mouth every 4 (four) hours as needed (pain).     . Insulin Human (INSULIN PUMP) SOLN Inject into the skin See admin instructions. Use with Humalog    . latanoprost (XALATAN) 0.005 % ophthalmic solution Place 1 drop into both eyes at bedtime.    . metoprolol tartrate (LOPRESSOR) 25 MG tablet Take 0.5 tablets (12.5 mg total) by mouth 2 (two) times daily. 60 tablet 0  . rosuvastatin (CRESTOR) 20 MG tablet Take 1 tablet (20 mg total) by mouth at bedtime. 90 tablet 3  . sodium fluoride (DENTA 5000 PLUS) 1.1 % CREA dental cream Place 1 application onto teeth at bedtime.    . ticagrelor (BRILINTA) 90 MG TABS tablet Take 1 tablet (90 mg total) by mouth 2 (two) times daily. 60 tablet 6  . traZODone (DESYREL) 50 MG tablet Take 50 mg by mouth at  bedtime.     Marland Kitchen ezetimibe (ZETIA) 10 MG tablet Take 1 tablet (10 mg total) by mouth daily. 90 tablet 3  . nitroGLYCERIN (NITROSTAT) 0.4 MG SL tablet Place 1 tablet (0.4 mg total) under the tongue every  5 (five) minutes x 3 doses as needed for chest pain. (Patient not taking: Reported on 08/21/2018) 30 tablet 0   No current facility-administered medications for this visit.     LABS/IMAGING: No results found for this or any previous visit (from the past 48 hour(s)). No results found.  VITALS: BP 136/70   Pulse (!) 59   Ht 5\' 2"  (1.575 m)   Wt 166 lb (75.3 kg)   LMP 08/06/1999 (Approximate)   BMI 30.36 kg/m   EXAM: General appearance: alert and no distress Neck: no carotid bruit, no JVD and thyroid not enlarged, symmetric, no tenderness/mass/nodules Lungs: clear to auscultation bilaterally Heart: regular rate and rhythm, S1, S2 normal and systolic murmur: early systolic 2/6, crescendo at 2nd right intercostal space Abdomen: soft, non-tender; bowel sounds normal; no masses,  no organomegaly Extremities: edema 1+ bilateral pedal edema Pulses: 2+ and symmetric Skin: Skin color, texture, turgor normal. No rashes or lesions Neurologic: Grossly normal Psych: Pleasant  EKG: Normal sinus rhythm 63-personally reviewed  ASSESSMENT: 1. NSTEMI-status post PCI to the RCA with DES (04/2018) - residual 50-70% mid-LAD stenosis 2. Coronary artery disease with moderate proximal LAD and RCA stenosis and a coronary calcium score of 678 (06/2015) - by CT coronary angiography 3. Hypertension-controlled 4. Anxiety  5. Palpitations - resolved 6. Insulin-dependent diabetes 7. Dyslipidemia 8. Depression 9. Murmur (mild aortic stenosis-05/2015)  PLAN: 1.   Mrs. Rappleye is doing well without any chest pain or worsening shortness of breath.  She is experiencing some lower extremity edema.  She was taken off of her HCTZ.  I would like to restart that today 12.5 mg daily.  We will repeat a metabolic profile  in about 2 weeks.  At that time she will have a repeat lipid profile and liver enzymes.  Plan follow-up with me in 6 months or sooner as necessary.  Pixie Casino, MD, [facility], [place] Director of the Advanced Lipid Disorders &  Cardiovascular Risk Reduction Clinic Diplomate of the American Board of Clinical Lipidology Attending Cardiologist  Direct Dial: [phone]  Fax: [phone]  Website:  [email or web address]  08/21/2018, 9:51 AM

## 2018-08-21 NOTE — Patient Instructions (Signed)
Medication Instructions:  START hydrochlorothiazide 12.5mg  daily If you need a refill on your cardiac medications before your next appointment, please call your pharmacy.   Lab work: FASTING lab work in 2 weeks - BMET, lipid, liver If you have labs (blood work) drawn today and your tests are completely normal, you will receive your results only by: Marland Kitchen MyChart Message (if you have MyChart) OR . A paper copy in the mail If you have any lab test that is abnormal or we need to change your treatment, we will call you to review the results.  Testing/Procedures: NONE  Follow-Up: At [facility], you and your health needs are our priority.  As part of our continuing mission to provide you with exceptional heart care, we have created designated Provider Care Teams.  These Care Teams include your primary Cardiologist (physician) and Advanced Practice Providers (APPs -  Physician Assistants and Nurse Practitioners) who all work together to provide you with the care you need, when you need it. You will need a follow up appointment in 6 months.  Please call our office 2 months in advance to schedule this appointment.  You may see Pixie Casino, MD or one of the following Advanced Practice Providers on your designated Care Team: [place], [place] . Fabian Sharp, PA-C  Any Other Special Instructions Will Be Listed Below (If Applicable).

## 2018-08-24 ENCOUNTER — Ambulatory Visit (HOSPITAL_COMMUNITY): Payer: Medicare Other

## 2018-08-24 ENCOUNTER — Encounter (HOSPITAL_COMMUNITY)
Admission: RE | Admit: 2018-08-24 | Discharge: 2018-08-24 | Disposition: A | Discharge status: Home / Self Care | Payer: Medicare Other | Source: Ambulatory Visit | Attending: Internal Medicine | Admitting: Internal Medicine

## 2018-08-24 DIAGNOSIS — I214 Non-ST elevation (NSTEMI) myocardial infarction: Secondary | ICD-10-CM

## 2018-08-24 DIAGNOSIS — Z955 Presence of coronary angioplasty implant and graft: Secondary | ICD-10-CM

## 2018-08-24 NOTE — Progress Notes (Signed)
QUALITY OF LIFE SCORE REVIEW  Pt completed Quality of Life survey as a participant in Cardiac Rehab. Scores 21.0 or below are considered low. Pt score very low in several areas Overall 22.13, Health and Function 22.13, socioeconomic 16.58, physiological and spiritual 23.71, family 30.00. Patient quality of life slightly altered by physical constraints which limits ability to perform as prior to recent cardiac illness. Kelsey Pierce says she has been feeling better since she has been participate in phase 2. Kelsey Pierce does report being depressed at times but says she usually feels down on cloudy days. Kelsey Pierce denies feeling depressed today.   Offered emotional support and reassurance.  Will continue to monitor and intervene as necessary.  Barnet Pall, RN,BSN 08/24/2018 12:29 PM

## 2018-08-24 NOTE — Progress Notes (Signed)
I have reviewed a Home Exercise Prescription with [name] . Keyly is currently exercising at home. The patient was advised to continue to walk 2 days a week for 30-45  minutes.  Enid Derry and I discussed how to progress their exercise prescription. The patient stated that they understand the exercise prescription. We reviewed exercise guidelines, target heart rate during exercise, RPE Scale, weather conditions, NTG use, endpoints for exercise, warmup and cool down. Patient is encouraged to come to me with any questions. I will continue to follow up with the patient to assist them with progression and safety.    Carma Lair MS, ACSM CEP 10:17 AM 08/24/2018

## 2018-08-26 ENCOUNTER — Ambulatory Visit (HOSPITAL_COMMUNITY): Payer: Medicare Other

## 2018-08-26 ENCOUNTER — Encounter (HOSPITAL_COMMUNITY)
Admission: RE | Admit: 2018-08-26 | Discharge: 2018-08-26 | Disposition: A | Discharge status: Home / Self Care | Payer: Medicare Other | Source: Ambulatory Visit | Attending: Internal Medicine | Admitting: Internal Medicine

## 2018-08-26 DIAGNOSIS — Z955 Presence of coronary angioplasty implant and graft: Secondary | ICD-10-CM

## 2018-08-26 DIAGNOSIS — I214 Non-ST elevation (NSTEMI) myocardial infarction: Secondary | ICD-10-CM

## 2018-08-28 ENCOUNTER — Encounter (HOSPITAL_COMMUNITY)
Admission: RE | Admit: 2018-08-28 | Discharge: 2018-08-28 | Disposition: A | Discharge status: Home / Self Care | Payer: Medicare Other | Source: Ambulatory Visit | Attending: Internal Medicine | Admitting: Internal Medicine

## 2018-08-28 ENCOUNTER — Ambulatory Visit (HOSPITAL_COMMUNITY): Payer: Medicare Other

## 2018-08-28 DIAGNOSIS — I214 Non-ST elevation (NSTEMI) myocardial infarction: Secondary | ICD-10-CM

## 2018-08-28 DIAGNOSIS — Z955 Presence of coronary angioplasty implant and graft: Secondary | ICD-10-CM

## 2018-08-31 ENCOUNTER — Encounter (HOSPITAL_COMMUNITY)
Admission: RE | Admit: 2018-08-31 | Discharge: 2018-08-31 | Disposition: A | Discharge status: Home / Self Care | Payer: Medicare Other | Source: Ambulatory Visit | Attending: Internal Medicine | Admitting: Internal Medicine

## 2018-08-31 ENCOUNTER — Ambulatory Visit (HOSPITAL_COMMUNITY): Payer: Medicare Other

## 2018-08-31 DIAGNOSIS — I214 Non-ST elevation (NSTEMI) myocardial infarction: Secondary | ICD-10-CM | POA: Diagnosis not present

## 2018-08-31 DIAGNOSIS — Z955 Presence of coronary angioplasty implant and graft: Secondary | ICD-10-CM

## 2018-09-02 ENCOUNTER — Ambulatory Visit (HOSPITAL_COMMUNITY): Payer: Medicare Other

## 2018-09-02 ENCOUNTER — Encounter (HOSPITAL_COMMUNITY)
Admission: RE | Admit: 2018-09-02 | Discharge: 2018-09-02 | Disposition: A | Discharge status: Home / Self Care | Payer: Medicare Other | Source: Ambulatory Visit | Attending: Internal Medicine | Admitting: Internal Medicine

## 2018-09-02 DIAGNOSIS — I214 Non-ST elevation (NSTEMI) myocardial infarction: Secondary | ICD-10-CM

## 2018-09-02 DIAGNOSIS — Z955 Presence of coronary angioplasty implant and graft: Secondary | ICD-10-CM

## 2018-09-04 ENCOUNTER — Encounter (HOSPITAL_COMMUNITY)
Admission: RE | Admit: 2018-09-04 | Discharge: 2018-09-04 | Disposition: A | Discharge status: Home / Self Care | Payer: Medicare Other | Source: Ambulatory Visit | Attending: Internal Medicine | Admitting: Internal Medicine

## 2018-09-04 ENCOUNTER — Ambulatory Visit (HOSPITAL_COMMUNITY): Payer: Medicare Other

## 2018-09-04 DIAGNOSIS — I214 Non-ST elevation (NSTEMI) myocardial infarction: Secondary | ICD-10-CM

## 2018-09-04 DIAGNOSIS — Z955 Presence of coronary angioplasty implant and graft: Secondary | ICD-10-CM

## 2018-09-07 ENCOUNTER — Ambulatory Visit (HOSPITAL_COMMUNITY): Payer: Medicare Other

## 2018-09-07 ENCOUNTER — Encounter (HOSPITAL_COMMUNITY)
Admission: RE | Admit: 2018-09-07 | Discharge: 2018-09-07 | Disposition: A | Discharge status: Home / Self Care | Payer: Medicare Other | Source: Ambulatory Visit | Attending: Internal Medicine | Admitting: Internal Medicine

## 2018-09-07 DIAGNOSIS — I10 Essential (primary) hypertension: Secondary | ICD-10-CM | POA: Diagnosis not present

## 2018-09-07 DIAGNOSIS — E785 Hyperlipidemia, unspecified: Secondary | ICD-10-CM | POA: Diagnosis not present

## 2018-09-07 DIAGNOSIS — Z955 Presence of coronary angioplasty implant and graft: Secondary | ICD-10-CM

## 2018-09-07 DIAGNOSIS — Z7982 Long term (current) use of aspirin: Secondary | ICD-10-CM | POA: Diagnosis not present

## 2018-09-07 DIAGNOSIS — I214 Non-ST elevation (NSTEMI) myocardial infarction: Secondary | ICD-10-CM | POA: Insufficient documentation

## 2018-09-07 DIAGNOSIS — Z79899 Other long term (current) drug therapy: Secondary | ICD-10-CM | POA: Insufficient documentation

## 2018-09-08 LAB — BASIC METABOLIC PANEL
BUN/Creatinine Ratio: 19 (ref 12–28)
BUN: 15 mg/dL (ref 8–27)
CALCIUM: 9.9 mg/dL (ref 8.7–10.3)
CO2: 24 mmol/L (ref 20–29)
Chloride: 99 mmol/L (ref 96–106)
Creatinine, Ser: 0.8 mg/dL (ref 0.57–1.00)
GFR calc non Af Amer: 73 mL/min/{1.73_m2} (ref >59)
GFR, EST AFRICAN AMERICAN: 84 mL/min/{1.73_m2} (ref >59)
Glucose: 148 mg/dL — ABNORMAL HIGH (ref 65–99)
Potassium: 4.1 mmol/L (ref 3.5–5.2)
Sodium: 138 mmol/L (ref 134–144)

## 2018-09-08 LAB — HEPATIC FUNCTION PANEL
ALT: 48 IU/L — ABNORMAL HIGH (ref 0–32)
AST: 59 IU/L — ABNORMAL HIGH (ref 0–40)
Albumin: 4.2 g/dL (ref 3.7–4.7)
Alkaline Phosphatase: 116 IU/L (ref 39–117)
Bilirubin Total: 0.4 mg/dL (ref 0.0–1.2)
Bilirubin, Direct: 0.13 mg/dL (ref 0.00–0.40)
Total Protein: 7.3 g/dL (ref 6.0–8.5)

## 2018-09-08 LAB — LIPID PANEL
Chol/HDL Ratio: 2.4 ratio (ref 0.0–4.4)
Cholesterol, Total: 127 mg/dL (ref 100–199)
HDL: 54 mg/dL (ref >39)
LDL Calculated: 58 mg/dL (ref 0–99)
Triglycerides: 75 mg/dL (ref 0–149)
VLDL CHOLESTEROL CAL: 15 mg/dL (ref 5–40)

## 2018-09-08 NOTE — Progress Notes (Signed)
Cardiac Individual Treatment Plan  Patient Details  Name: Kelsey Pierce MRN: [id number] Date of Birth: November 02, 1945 Referring Provider:     CARDIAC REHAB PHASE II ORIENTATION from 07/21/2018 in [place]  Referring Provider  Dr. Debara Pickett      Initial Encounter Date:    CARDIAC REHAB PHASE II ORIENTATION from 07/21/2018 in [place]  Date  07/21/18      Visit Diagnosis: 05/04/2018 NSTEMI (non-ST elevated myocardial infarction) ([place])  05/04/2018 Stented coronary artery  Patient's Home Medications on Admission:  Current Outpatient Medications:  .  ACCU-CHEK AVIVA PLUS test strip, , Disp: , Rfl:  .  ALPRAZolam (XANAX) 0.25 MG tablet, Take 0.25 mg by mouth 3 (three) times daily as needed for anxiety. , Disp: , Rfl:  .  amLODipine (NORVASC) 10 MG tablet, Take 1 tablet (10 mg total) by mouth daily., Disp: 90 tablet, Rfl: 3 .  aspirin EC 81 MG EC tablet, Take 1 tablet (81 mg total) by mouth daily., Disp: 30 tablet, Rfl: 0 .  Cholecalciferol (VITAMIN D) 2000 units tablet, Take 2,000 Units by mouth daily., Disp: , Rfl:  .  clobetasol ointment (TEMOVATE) 0.05 %, APPLY TOPICALLY 2 (TWO) TIMES A WEEK. USE TWICE WEEKLY AS NEEDED TO CONTROL SYMPTOMS. (Patient taking differently: Apply 1 application topically 4 (four) times daily as needed (lichen sclerosus flares). ), Disp: 30 g, Rfl: 0 .  DULoxetine (CYMBALTA) 30 MG capsule, Take 30 mg by mouth daily. , Disp: , Rfl:  .  ezetimibe (ZETIA) 10 MG tablet, Take 1 tablet (10 mg total) by mouth daily., Disp: 90 tablet, Rfl: 3 .  hydrochlorothiazide (MICROZIDE) 12.5 MG capsule, Take 1 capsule (12.5 mg total) by mouth daily., Disp: 90 capsule, Rfl: 3 .  HYDROcodone-acetaminophen (NORCO/VICODIN) 5-325 MG per tablet, Take 1 tablet by mouth every 4 (four) hours as needed (pain). , Disp: , Rfl:  .  Insulin Human (INSULIN PUMP) SOLN, Inject into the skin See admin instructions. Use with Humalog,  Disp: , Rfl:  .  latanoprost (XALATAN) 0.005 % ophthalmic solution, Place 1 drop into both eyes at bedtime., Disp: , Rfl:  .  metoprolol tartrate (LOPRESSOR) 25 MG tablet, Take 0.5 tablets (12.5 mg total) by mouth 2 (two) times daily., Disp: 60 tablet, Rfl: 0 .  nitroGLYCERIN (NITROSTAT) 0.4 MG SL tablet, Place 1 tablet (0.4 mg total) under the tongue every 5 (five) minutes x 3 doses as needed for chest pain. (Patient not taking: Reported on 08/21/2018), Disp: 30 tablet, Rfl: 0 .  rosuvastatin (CRESTOR) 20 MG tablet, Take 1 tablet (20 mg total) by mouth at bedtime., Disp: 90 tablet, Rfl: 3 .  sodium fluoride (DENTA 5000 PLUS) 1.1 % CREA dental cream, Place 1 application onto teeth at bedtime., Disp: , Rfl:  .  ticagrelor (BRILINTA) 90 MG TABS tablet, Take 1 tablet (90 mg total) by mouth 2 (two) times daily., Disp: 60 tablet, Rfl: 6 .  traZODone (DESYREL) 50 MG tablet, Take 50 mg by mouth at bedtime. , Disp: , Rfl:   Past Medical History: Past Medical History:  Diagnosis Date  . Adenomatous colon polyp 2014   repeat colon in 5 years  . Bilateral cataracts   . Chest pain    2D ECHO, 05/29/2011 -EF >55%, normal; MYOVIEW, 05/29/2011 - normal  . Depression   . Diabetes ([place])   . Fracture, foot june 2014   left  . Glaucoma   . Hyperlipidemia   . Hypertension   .  Lichen sclerosus et atrophicus of the vulva 05/2014  . Neuropathy    legs  . PMB (postmenopausal bleeding)     Tobacco Use: Social History   Tobacco Use  Smoking Status Never Smoker  Smokeless Tobacco Never Used    Labs: Recent Review Flowsheet Data    Labs for ITP Cardiac and Pulmonary Rehab Latest Ref Rng & Units 05/22/2018 09/08/2018   Cholestrol 100 - 199 mg/dL 124 127   LDLCALC 0 - 99 mg/dL 49 58   HDL >39 mg/dL 60 54   Trlycerides 0 - 149 mg/dL 75 75      Capillary Blood Glucose: Lab Results  Component Value Date   GLUCAP 195 (H) 08/03/2018   GLUCAP 236 (H) 08/03/2018   GLUCAP 185 (H) 07/31/2018   GLUCAP 173  (H) 07/31/2018   GLUCAP 270 (H) 05/05/2018     Exercise Target Goals: Exercise Program Goal: Individual exercise prescription set using results from initial 6 min walk test and THRR while considering  patient's activity barriers and safety.   Exercise Prescription Goal: Initial exercise prescription builds to 30-45 minutes a day of aerobic activity, 2-3 days per week.  Home exercise guidelines will be given to patient during program as part of exercise prescription that the participant will acknowledge.  Activity Barriers & Risk Stratification: Activity Barriers & Cardiac Risk Stratification - 07/21/18 0951      Activity Barriers & Cardiac Risk Stratification   Activity Barriers  Deconditioning;Muscular Weakness    Cardiac Risk Stratification  High       6 Minute Walk: 6 Minute Walk    Row Name 07/21/18 0948         6 Minute Walk   Phase  Initial     Distance  1400 feet     Walk Time  6 minutes     # of Rest Breaks  0     MPH  2.65     METS  2.65     RPE  11     VO2 Peak  9.28     Symptoms  No     Resting HR  63 bpm     Resting BP  140/64     Resting Oxygen Saturation   100 %     Exercise Oxygen Saturation  during 6 min walk  97 %     Max Ex. HR  86 bpm     Max Ex. BP  142/70     2 Minute Post BP  104/60        Oxygen Initial Assessment:   Oxygen Re-Evaluation:   Oxygen Discharge (Final Oxygen Re-Evaluation):   Initial Exercise Prescription: Initial Exercise Prescription - 07/21/18 1100      Date of Initial Exercise RX and Referring Provider   Date  07/21/18    Referring Provider  Dr. Debara Pickett    Expected Discharge Date  10/31/18      NuStep   Level  3    SPM  85    Minutes  10    METs  2.7      Arm Ergometer   Level  1    Watts  33    Minutes  10    METs  3.2      Track   Laps  9    Minutes  10    METs  2.53      Prescription Details   Frequency (times per week)  3    Duration  Progress  to 30 minutes of continuous aerobic without  signs/symptoms of physical distress      Intensity   THRR 40-80% of Max Heartrate  58-117    Ratings of Perceived Exertion  11-13      Progression   Progression  Continue to progress workloads to maintain intensity without signs/symptoms of physical distress.      Resistance Training   Training Prescription  Yes    Weight  3 lbs.     Reps  10-15       Perform Capillary Blood Glucose checks as needed.  Exercise Prescription Changes:  Exercise Prescription Changes    Row Name 07/31/18 1006 08/03/18 0955 08/17/18 0953 08/31/18 1005       Response to Exercise   Blood Pressure (Admit)  98/60 recheck BP 122/70  132/72  122/72  118/64    Blood Pressure (Exercise)  142/60  142/64  148/72  142/72    Blood Pressure (Exit)  130/70  122/60  122/60  100/60    Heart Rate (Admit)  56 bpm  63 bpm  67 bpm  73 bpm    Heart Rate (Exercise)  91 bpm  104 bpm  98 bpm  106 bpm    Heart Rate (Exit)  66 bpm  69 bpm  77 bpm  71 bpm    Rating of Perceived Exertion (Exercise)  13  12  12  13     Symptoms  none  none  none  none    Comments  late start  -  -  -    Duration  Progress to 30 minutes of  aerobic without signs/symptoms of physical distress  Progress to 30 minutes of  aerobic without signs/symptoms of physical distress  Progress to 30 minutes of  aerobic without signs/symptoms of physical distress  Progress to 30 minutes of  aerobic without signs/symptoms of physical distress    Intensity  THRR unchanged  THRR unchanged  THRR unchanged  THRR unchanged      Progression   Progression  Continue to progress workloads to maintain intensity without signs/symptoms of physical distress.  Continue to progress workloads to maintain intensity without signs/symptoms of physical distress.  Continue to progress workloads to maintain intensity without signs/symptoms of physical distress.  Continue to progress workloads to maintain intensity without signs/symptoms of physical distress.    Average METs  -  2.6   3.7  3.3      Resistance Training   Training Prescription  Yes  Yes  Yes  Yes    Weight  1lbs  3 lbs.   4lbs  3lbs    Reps  10-15  10-15  10-15  10-15    Time  10 Minutes  10 Minutes  10 Minutes  10 Minutes      Interval Training   Interval Training  No  No  No  No      NuStep   Level  -  3  4  4     SPM  -  85  85  85    Minutes  -  10  10  10     METs  -  2.2  3.2  3.7      Arm Ergometer   Level  1  1  3  2     Watts  -  33  33  -    Minutes  10  10  10  10     METs  -  -  -  2      Track   Laps  15  12  18  18     Minutes  10  10  10  10     METs  3.6  3.09  4.14  4.14      Home Exercise Plan   Plans to continue exercise at  -  -  -  Home (comment) Walking at home    Frequency  -  -  -  Add 2 additional days to program exercise sessions.    Initial Home Exercises Provided  -  -  -  08/24/18       Exercise Comments:  Exercise Comments    Row Name 08/03/18 1024 08/17/18 1004 08/24/18 1019 08/31/18 1005     Exercise Comments  Reviewed METs with patient.  Reviewed METs and goals with patient.  Reviewed HEP with pt. Pt is currently exercising at home. Will continue to monitor and progress pt as tolerated.   Reviewed METs with patient.       Exercise Goals and Review:  Exercise Goals    Row Name 07/21/18 0951             Exercise Goals   Increase Physical Activity  Yes       Intervention  Provide advice, education, support and counseling about physical activity/exercise needs.;Develop an individualized exercise prescription for aerobic and resistive training based on initial evaluation findings, risk stratification, comorbidities and participant's personal goals.       Expected Outcomes  Short Term: Attend rehab on a regular basis to increase amount of physical activity.       Increase Strength and Stamina  Yes       Intervention  Provide advice, education, support and counseling about physical activity/exercise needs.;Develop an individualized exercise prescription  for aerobic and resistive training based on initial evaluation findings, risk stratification, comorbidities and participant's personal goals.       Expected Outcomes  Short Term: Increase workloads from initial exercise prescription for resistance, speed, and METs.       Able to understand and use rate of perceived exertion (RPE) scale  Yes       Intervention  Provide education and explanation on how to use RPE scale       Expected Outcomes  Short Term: Able to use RPE daily in rehab to express subjective intensity level;Long Term:  Able to use RPE to guide intensity level when exercising independently       Knowledge and understanding of Target Heart Rate Range (THRR)  Yes       Intervention  Provide education and explanation of THRR including how the numbers were predicted and where they are located for reference       Expected Outcomes  Short Term: Able to state/look up THRR;Long Term: Able to use THRR to govern intensity when exercising independently;Short Term: Able to use daily as guideline for intensity in rehab       Able to check pulse independently  Yes       Intervention  Provide education and demonstration on how to check pulse in carotid and radial arteries.;Review the importance of being able to check your own pulse for safety during independent exercise       Expected Outcomes  Short Term: Able to explain why pulse checking is important during independent exercise;Long Term: Able to check pulse independently and accurately       Understanding of Exercise Prescription  Yes  Intervention  Provide education, explanation, and written materials on patient's individual exercise prescription       Expected Outcomes  Short Term: Able to explain program exercise prescription;Long Term: Able to explain home exercise prescription to exercise independently          Exercise Goals Re-Evaluation : Exercise Goals Re-Evaluation    [place] Name 08/17/18 1004 08/24/18 1020           Exercise  Goal Re-Evaluation   Exercise Goals Review  Increase Physical Activity;Able to understand and use rate of perceived exertion (RPE) scale  Increase Physical Activity;Understanding of Exercise Prescription;Increase Strength and Stamina;Knowledge and understanding of Target Heart Rate Range (THRR);Able to understand and use rate of perceived exertion (RPE) scale;Able to check pulse independently      Comments  Patient is able to understand and use RPE scale appropriately. Pt not consistently walking at home because of the weather but plans to start walking 30 minutes inside as able.  Reviewed HEP with pt. Also reviewed THRR, RPE Scale, NTG use, weather conditions, endpoints of exercise, warmup and cool down.       Expected Outcomes  Patient will walk 30 minutes, 1-2 days/week in addition to exercise at cardiac rehab.  Pt will continue to walk 2 days a week for 30-45 minutes. Pt will continue to increase cardiorespiratory fitness. Will continue to monitor.          Discharge Exercise Prescription (Final Exercise Prescription Changes): Exercise Prescription Changes - 08/31/18 1005      Response to Exercise   Blood Pressure (Admit)  118/64    Blood Pressure (Exercise)  142/72    Blood Pressure (Exit)  100/60    Heart Rate (Admit)  73 bpm    Heart Rate (Exercise)  106 bpm    Heart Rate (Exit)  71 bpm    Rating of Perceived Exertion (Exercise)  13    Symptoms  none    Duration  Progress to 30 minutes of  aerobic without signs/symptoms of physical distress    Intensity  THRR unchanged      Progression   Progression  Continue to progress workloads to maintain intensity without signs/symptoms of physical distress.    Average METs  3.3      Resistance Training   Training Prescription  Yes    Weight  3lbs    Reps  10-15    Time  10 Minutes      Interval Training   Interval Training  No      NuStep   Level  4    SPM  85    Minutes  10    METs  3.7      Arm Ergometer   Level  2    Minutes   10    METs  2      Track   Laps  18    Minutes  10    METs  4.14      Home Exercise Plan   Plans to continue exercise at  Home (comment)   Walking at home   Frequency  Add 2 additional days to program exercise sessions.    Initial Home Exercises Provided  08/24/18       Nutrition:  Target Goals: Understanding of nutrition guidelines, daily intake of sodium 1500mg , cholesterol 200mg , calories 30% from fat and 7% or less from saturated fats, daily to have 5 or more servings of fruits and vegetables.  Biometrics: Pre Biometrics - 07/21/18 4742  Pre Biometrics   Height  5\' 3"  (1.6 m)    Weight  162 lb 0.6 oz (73.5 kg)    Waist Circumference  36 inches    Hip Circumference  46 inches    Waist to Hip Ratio  0.78 %    BMI (Calculated)  28.71    Triceps Skinfold  34 mm    % Body Fat  28.7 %    Grip Strength  23 kg    Flexibility  14.5 in    Single Leg Stand  4.31 seconds        Nutrition Therapy Plan and Nutrition Goals: Nutrition Therapy & Goals - 07/21/18 0946      Nutrition Therapy   Diet  heart healthy, diabetic       Personal Nutrition Goals   Nutrition Goal  Pt to identify, develop, and use a set schedule for eating    Personal Goal #2  Pt to identify food quantities necessary to achieve weight loss of 6-24 lbs. at graduation from cardiac rehab      [place], educate and counsel regarding individualized specific dietary modifications aiming towards targeted core components such as weight, hypertension, lipid management, diabetes, heart failure and other comorbidities.    Expected Outcomes  Short Term Goal: Understand basic principles of dietary content, such as calories, fat, sodium, cholesterol and nutrients.;Long Term Goal: Adherence to prescribed nutrition plan.       Nutrition Assessments: Nutrition Assessments - 08/10/18 1053      MEDFICTS Scores   Pre Score  6       Nutrition Goals  Re-Evaluation:   Nutrition Goals Re-Evaluation:   Nutrition Goals Discharge (Final Nutrition Goals Re-Evaluation):   Psychosocial: Target Goals: Acknowledge presence or absence of significant depression and/or stress, maximize coping skills, provide positive support system. Participant is able to verbalize types and ability to use techniques and skills needed for reducing stress and depression.  Initial Review & Psychosocial Screening: Initial Psych Review & Screening - 08/11/18 1542      Initial Review   Current issues with  History of Depression;Current Stress Concerns    Source of Stress Concerns  Chronic Illness;Family      Family Dynamics   Good Support System?  Yes   Carletha has her family for support     Barriers   Psychosocial barriers to participate in program  The patient should benefit from training in stress management and relaxation.      Screening Interventions   Interventions  Encouraged to exercise    Expected Outcomes  Long Term Goal: Stressors or current issues are controlled or eliminated.;Short Term goal: Utilizing psychosocial counselor, staff and physician to assist with identification of specific Stressors or current issues interfering with healing process. Setting desired goal for each stressor or current issue identified.;Short Term goal: Identification and review with participant of any Quality of Life or Depression concerns found by scoring the questionnaire.;Long Term goal: The participant improves quality of Life and PHQ9 Scores as seen by post scores and/or verbalization of changes       Quality of Life Scores: Quality of Life - 07/21/18 0955      Quality of Life   Select  Quality of Life      Quality of Life Scores   Health/Function Pre  22.13 %    Socioeconomic Pre  16.58 %    Psych/Spiritual Pre  23.71 %    Family Pre  30 %  GLOBAL Pre  22.45 %      Scores of 19 and below usually indicate a poorer quality of life in these areas.  A  difference of  2-3 points is a clinically meaningful difference.  A difference of 2-3 points in the total score of the Quality of Life Index has been associated with significant improvement in overall quality of life, self-image, physical symptoms, and general health in studies assessing change in quality of life.  PHQ-9: Recent Review Flowsheet Data    Depression screen [facility] 2/9 07/31/2018   Decreased Interest 1   Down, Depressed, Hopeless 0   PHQ - 2 Score 1     Interpretation of Total Score  Total Score Depression Severity:  1-4 = Minimal depression, 5-9 = Mild depression, 10-14 = Moderate depression, 15-19 = Moderately severe depression, 20-27 = Severe depression   Psychosocial Evaluation and Intervention:   Psychosocial Re-Evaluation: Psychosocial Re-Evaluation    [place] Name 08/11/18 1540 09/08/18 1551           Psychosocial Re-Evaluation   Current issues with  Current Stress Concerns  Current Stress Concerns      Comments  -  Caytlyn has not voiced any further increase in her stressors. Will continue to offer support as needed.      Interventions  Stress management education  Stress management education      Continue Psychosocial Services   No Follow up required  No Follow up required        Initial Review   Source of Stress Concerns  Chronic Illness  Chronic Illness;Family         Psychosocial Discharge (Final Psychosocial Re-Evaluation): Psychosocial Re-Evaluation - 09/08/18 1551      Psychosocial Re-Evaluation   Current issues with  Current Stress Concerns    Comments  Aveya has not voiced any further increase in her stressors. Will continue to offer support as needed.    Interventions  Stress management education    Continue Psychosocial Services   No Follow up required      Initial Review   Source of Stress Concerns  Chronic Illness;Family       Vocational Rehabilitation: Provide vocational rehab assistance to qualifying candidates.   Vocational Rehab  Evaluation & Intervention: Vocational Rehab - 07/21/18 1146      Initial Vocational Rehab Evaluation & Intervention   Assessment shows need for Vocational Rehabilitation  No   retired       Education: Education Goals: Education classes will be provided on a weekly basis, covering required topics. Participant will state understanding/return demonstration of topics presented.  Learning Barriers/Preferences: Learning Barriers/Preferences - 07/21/18 0957      Learning Barriers/Preferences   Learning Barriers  Sight   Glaucoma   Learning Preferences  Written Material;Individual Instruction;Group Instruction       Education Topics: Count Your Pulse:  -Group instruction provided by verbal instruction, demonstration, patient participation and written materials to support subject.  Instructors address importance of being able to find your pulse and how to count your pulse when at home without a heart monitor.  Patients get hands on experience counting their pulse with staff help and individually.   Heart Attack, Angina, and Risk Factor Modification:  -Group instruction provided by verbal instruction, video, and written materials to support subject.  Instructors address signs and symptoms of angina and heart attacks.    Also discuss risk factors for heart disease and how to make changes to improve heart health risk factors.  Functional Fitness:  -Group instruction provided by verbal instruction, demonstration, patient participation, and written materials to support subject.  Instructors address safety measures for doing things around the house.  Discuss how to get up and down off the floor, how to pick things up properly, how to safely get out of a chair without assistance, and balance training.   Meditation and Mindfulness:  -Group instruction provided by verbal instruction, patient participation, and written materials to support subject.  Instructor addresses importance of mindfulness  and meditation practice to help reduce stress and improve awareness.  Instructor also leads participants through a meditation exercise.    Stretching for Flexibility and Mobility:  -Group instruction provided by verbal instruction, patient participation, and written materials to support subject.  Instructors lead participants through series of stretches that are designed to increase flexibility thus improving mobility.  These stretches are additional exercise for major muscle groups that are typically performed during regular warm up and cool down.   Hands Only CPR:  -Group verbal, video, and participation provides a basic overview of AHA guidelines for community CPR. Role-play of emergencies allow participants the opportunity to practice calling for help and chest compression technique with discussion of AED use.   Hypertension: -Group verbal and written instruction that provides a basic overview of hypertension including the most recent diagnostic guidelines, risk factor reduction with self-care instructions and medication management.    Nutrition I class: Heart Healthy Eating:  -Group instruction provided by PowerPoint slides, verbal discussion, and written materials to support subject matter. The instructor gives an explanation and review of the Therapeutic Lifestyle Changes diet recommendations, which includes a discussion on lipid goals, dietary fat, sodium, fiber, plant stanol/sterol esters, sugar, and the components of a well-balanced, healthy diet.   Nutrition II class: Lifestyle Skills:  -Group instruction provided by PowerPoint slides, verbal discussion, and written materials to support subject matter. The instructor gives an explanation and review of label reading, grocery shopping for heart health, heart healthy recipe modifications, and ways to make healthier choices when eating out.   Diabetes Question & Answer:  -Group instruction provided by PowerPoint slides, verbal  discussion, and written materials to support subject matter. The instructor gives an explanation and review of diabetes co-morbidities, pre- and post-prandial blood glucose goals, pre-exercise blood glucose goals, signs, symptoms, and treatment of hypoglycemia and hyperglycemia, and foot care basics.   Diabetes Blitz:  -Group instruction provided by PowerPoint slides, verbal discussion, and written materials to support subject matter. The instructor gives an explanation and review of the physiology behind type 1 and type 2 diabetes, diabetes medications and rational behind using different medications, pre- and post-prandial blood glucose recommendations and Hemoglobin A1c goals, diabetes diet, and exercise including blood glucose guidelines for exercising safely.    Portion Distortion:  -Group instruction provided by PowerPoint slides, verbal discussion, written materials, and food models to support subject matter. The instructor gives an explanation of serving size versus portion size, changes in portions sizes over the last 20 years, and what consists of a serving from each food group.   Stress Management:  -Group instruction provided by verbal instruction, video, and written materials to support subject matter.  Instructors review role of stress in heart disease and how to cope with stress positively.     CARDIAC REHAB PHASE II EXERCISE from 09/02/2018 in [place]  Date  09/02/18  Educator  RN  Instruction Review Code  2- Demonstrated Understanding      Exercising  on Your Own:  -Group instruction provided by verbal instruction, power point, and written materials to support subject.  Instructors discuss benefits of exercise, components of exercise, frequency and intensity of exercise, and end points for exercise.  Also discuss use of nitroglycerin and activating EMS.  Review options of places to exercise outside of rehab.  Review guidelines for sex with heart  disease.   Cardiac Drugs I:  -Group instruction provided by verbal instruction and written materials to support subject.  Instructor reviews cardiac drug classes: antiplatelets, anticoagulants, beta blockers, and statins.  Instructor discusses reasons, side effects, and lifestyle considerations for each drug class.   Cardiac Drugs II:  -Group instruction provided by verbal instruction and written materials to support subject.  Instructor reviews cardiac drug classes: angiotensin converting enzyme inhibitors (ACE-I), angiotensin II receptor blockers (ARBs), nitrates, and calcium channel blockers.  Instructor discusses reasons, side effects, and lifestyle considerations for each drug class.   Anatomy and Physiology of the Circulatory System:  Group verbal and written instruction and models provide basic cardiac anatomy and physiology, with the coronary electrical and arterial systems. Review of: AMI, Angina, Valve disease, Heart Failure, Peripheral Artery Disease, Cardiac Arrhythmia, Pacemakers, and the ICD.   Other Education:  -Group or individual verbal, written, or video instructions that support the educational goals of the cardiac rehab program.   Holiday Eating Survival Tips:  -Group instruction provided by PowerPoint slides, verbal discussion, and written materials to support subject matter. The instructor gives patients tips, tricks, and techniques to help them not only survive but enjoy the holidays despite the onslaught of food that accompanies the holidays.   Knowledge Questionnaire Score: Knowledge Questionnaire Score - 07/21/18 0953      Knowledge Questionnaire Score   Pre Score  15/24       Core Components/Risk Factors/Patient Goals at Admission: Personal Goals and Risk Factors at Admission - 07/21/18 0957      Core Components/Risk Factors/Patient Goals on Admission    Weight Management  Yes;Weight Maintenance;Weight Loss    Intervention  Weight Management: Develop a  combined nutrition and exercise program designed to reach desired caloric intake, while maintaining appropriate intake of nutrient and fiber, sodium and fats, and appropriate energy expenditure required for the weight goal.;Weight Management: Provide education and appropriate resources to help participant work on and attain dietary goals.;Weight Management/Obesity: Establish reasonable short term and long term weight goals.    Admit Weight  162 lb 0.6 oz (73.5 kg)    Expected Outcomes  Short Term: Continue to assess and modify interventions until short term weight is achieved;Long Term: Adherence to nutrition and physical activity/exercise program aimed toward attainment of established weight goal;Weight Maintenance: Understanding of the daily nutrition guidelines, which includes 25-35% calories from fat, 7% or less cal from saturated fats, less than 200mg  cholesterol, less than 1.5gm of sodium, & 5 or more servings of fruits and vegetables daily;Weight Loss: Understanding of general recommendations for a balanced deficit meal plan, which promotes 1-2 lb weight loss per week and includes a negative energy balance of [phone] kcal/d;Understanding recommendations for meals to include 15-35% energy as protein, 25-35% energy from fat, 35-60% energy from carbohydrates, less than 200mg  of dietary cholesterol, 20-35 gm of total fiber daily;Understanding of distribution of calorie intake throughout the day with the consumption of 4-5 meals/snacks    Diabetes  Yes    Intervention  Provide education about signs/symptoms and action to take for hypo/hyperglycemia.;Provide education about proper nutrition, including hydration, and aerobic/resistive exercise prescription  along with prescribed medications to achieve blood glucose in normal ranges: Fasting glucose 65-99 mg/dL    Expected Outcomes  Short Term: Participant verbalizes understanding of the signs/symptoms and immediate care of hyper/hypoglycemia, proper foot care  and importance of medication, aerobic/resistive exercise and nutrition plan for blood glucose control.;Long Term: Attainment of HbA1C < 7%.    Hypertension  Yes    Intervention  Provide education on lifestyle modifcations including regular physical activity/exercise, weight management, moderate sodium restriction and increased consumption of fresh fruit, vegetables, and low fat dairy, alcohol moderation, and smoking cessation.;Monitor prescription use compliance.    Expected Outcomes  Short Term: Continued assessment and intervention until BP is < 140/4mm HG in hypertensive participants. < 130/50mm HG in hypertensive participants with diabetes, heart failure or chronic kidney disease.;Long Term: Maintenance of blood pressure at goal levels.    Lipids  Yes    Intervention  Provide education and support for participant on nutrition & aerobic/resistive exercise along with prescribed medications to achieve LDL 70mg , HDL >40mg .    Expected Outcomes  Short Term: Participant states understanding of desired cholesterol values and is compliant with medications prescribed. Participant is following exercise prescription and nutrition guidelines.;Long Term: Cholesterol controlled with medications as prescribed, with individualized exercise RX and with personalized nutrition plan. Value goals: LDL < 70mg , HDL > 40 mg.    Stress  Yes    Intervention  Offer individual and/or small group education and counseling on adjustment to heart disease, stress management and health-related lifestyle change. Teach and support self-help strategies.;Refer participants experiencing significant psychosocial distress to appropriate mental health specialists for further evaluation and treatment. When possible, include family members and significant others in education/counseling sessions.    Expected Outcomes  Short Term: Participant demonstrates changes in health-related behavior, relaxation and other stress management skills, ability  to obtain effective social support, and compliance with psychotropic medications if prescribed.;Long Term: Emotional wellbeing is indicated by absence of clinically significant psychosocial distress or social isolation.       Core Components/Risk Factors/Patient Goals Review:  Goals and Risk Factor Review    Row Name 08/11/18 1545 09/08/18 1552           Core Components/Risk Factors/Patient Goals Review   Personal Goals Review  Weight Management/Obesity;Lipids;Hypertension;Diabetes;Stress  Weight Management/Obesity;Lipids;Hypertension;Diabetes;Stress      Review  Kana is off to a good start to exercise. Cylinda's vital signs and CBG's have been stable at cardiac rehab. Sunita has been suspending her insulin pump during exercise.  Luba's vital signs and CBG's have been stable. Cleopha is doing well with exercise.      Expected Outcomes  Teia will continue to participate in phase 2 cardiac rehab for exercise, nutrition and lifestyle modifications  Mellonie will continue to participate in phase 2 cardiac rehab for exercise, nutrition and lifestyle modifications         Core Components/Risk Factors/Patient Goals at Discharge (Final Review):  Goals and Risk Factor Review - 09/08/18 1552      Core Components/Risk Factors/Patient Goals Review   Personal Goals Review  Weight Management/Obesity;Lipids;Hypertension;Diabetes;Stress    Review  Gracee's vital signs and CBG's have been stable. Maverick is doing well with exercise.    Expected Outcomes  Keierra will continue to participate in phase 2 cardiac rehab for exercise, nutrition and lifestyle modifications       ITP Comments: ITP Comments    Row Name 07/21/18 0834 08/11/18 1528 09/08/18 1550       ITP Comments  Dr. Tressia Miners  Turner, Medical Director   30 Day ITP Review.  Allyana is off to a good start to exercise  30 Day ITP Review. Nadiah is with good partcipation and attendance in phase 2 cardiac rehab         Comments: See ITP  comments.Barnet Pall, RN,BSN 09/10/2018 10:29 AM

## 2018-09-09 ENCOUNTER — Ambulatory Visit (HOSPITAL_COMMUNITY): Payer: Medicare Other

## 2018-09-09 ENCOUNTER — Encounter (HOSPITAL_COMMUNITY)
Admission: RE | Admit: 2018-09-09 | Discharge: 2018-09-09 | Disposition: A | Discharge status: Home / Self Care | Payer: Medicare Other | Source: Ambulatory Visit | Attending: Internal Medicine | Admitting: Internal Medicine

## 2018-09-09 DIAGNOSIS — I214 Non-ST elevation (NSTEMI) myocardial infarction: Secondary | ICD-10-CM

## 2018-09-09 DIAGNOSIS — Z955 Presence of coronary angioplasty implant and graft: Secondary | ICD-10-CM

## 2018-09-09 NOTE — Progress Notes (Signed)
Liver enzyme rising again, discussed with Dr. Debara Pickett who recommended repeat liver enzyme in 1 month to trend

## 2018-09-10 ENCOUNTER — Other Ambulatory Visit: Payer: Self-pay

## 2018-09-10 DIAGNOSIS — E785 Hyperlipidemia, unspecified: Secondary | ICD-10-CM

## 2018-09-11 ENCOUNTER — Encounter (HOSPITAL_COMMUNITY)
Admission: RE | Admit: 2018-09-11 | Discharge: 2018-09-11 | Disposition: A | Discharge status: Home / Self Care | Payer: Medicare Other | Source: Ambulatory Visit | Attending: Internal Medicine | Admitting: Internal Medicine

## 2018-09-11 ENCOUNTER — Ambulatory Visit (HOSPITAL_COMMUNITY): Payer: Medicare Other

## 2018-09-11 DIAGNOSIS — Z955 Presence of coronary angioplasty implant and graft: Secondary | ICD-10-CM

## 2018-09-11 DIAGNOSIS — I214 Non-ST elevation (NSTEMI) myocardial infarction: Secondary | ICD-10-CM | POA: Diagnosis not present

## 2018-09-14 ENCOUNTER — Encounter (HOSPITAL_COMMUNITY)
Admission: RE | Admit: 2018-09-14 | Discharge: 2018-09-14 | Disposition: A | Discharge status: Home / Self Care | Payer: Medicare Other | Source: Ambulatory Visit | Attending: Internal Medicine | Admitting: Internal Medicine

## 2018-09-14 ENCOUNTER — Ambulatory Visit (HOSPITAL_COMMUNITY): Payer: Medicare Other

## 2018-09-14 DIAGNOSIS — I214 Non-ST elevation (NSTEMI) myocardial infarction: Secondary | ICD-10-CM

## 2018-09-14 DIAGNOSIS — Z955 Presence of coronary angioplasty implant and graft: Secondary | ICD-10-CM

## 2018-09-16 ENCOUNTER — Encounter (HOSPITAL_COMMUNITY)
Admission: RE | Admit: 2018-09-16 | Discharge: 2018-09-16 | Disposition: A | Discharge status: Home / Self Care | Payer: Medicare Other | Source: Ambulatory Visit | Attending: Internal Medicine | Admitting: Internal Medicine

## 2018-09-16 ENCOUNTER — Ambulatory Visit (HOSPITAL_COMMUNITY): Payer: Medicare Other

## 2018-09-16 DIAGNOSIS — Z955 Presence of coronary angioplasty implant and graft: Secondary | ICD-10-CM

## 2018-09-16 DIAGNOSIS — I214 Non-ST elevation (NSTEMI) myocardial infarction: Secondary | ICD-10-CM | POA: Diagnosis not present

## 2018-09-18 ENCOUNTER — Encounter (HOSPITAL_COMMUNITY)
Admission: RE | Admit: 2018-09-18 | Discharge: 2018-09-18 | Disposition: A | Discharge status: Home / Self Care | Payer: Medicare Other | Source: Ambulatory Visit | Attending: Internal Medicine | Admitting: Internal Medicine

## 2018-09-18 ENCOUNTER — Ambulatory Visit (HOSPITAL_COMMUNITY): Payer: Medicare Other

## 2018-09-18 DIAGNOSIS — I214 Non-ST elevation (NSTEMI) myocardial infarction: Secondary | ICD-10-CM

## 2018-09-18 DIAGNOSIS — Z955 Presence of coronary angioplasty implant and graft: Secondary | ICD-10-CM

## 2018-09-21 ENCOUNTER — Encounter (HOSPITAL_COMMUNITY)
Admission: RE | Admit: 2018-09-21 | Discharge: 2018-09-21 | Disposition: A | Discharge status: Home / Self Care | Payer: Medicare Other | Source: Ambulatory Visit | Attending: Internal Medicine | Admitting: Internal Medicine

## 2018-09-21 ENCOUNTER — Ambulatory Visit (HOSPITAL_COMMUNITY): Payer: Medicare Other

## 2018-09-21 DIAGNOSIS — I214 Non-ST elevation (NSTEMI) myocardial infarction: Secondary | ICD-10-CM

## 2018-09-21 DIAGNOSIS — Z955 Presence of coronary angioplasty implant and graft: Secondary | ICD-10-CM

## 2018-09-23 ENCOUNTER — Encounter (HOSPITAL_COMMUNITY)
Admission: RE | Admit: 2018-09-23 | Discharge: 2018-09-23 | Disposition: A | Discharge status: Home / Self Care | Payer: Medicare Other | Source: Ambulatory Visit | Attending: Internal Medicine | Admitting: Internal Medicine

## 2018-09-23 ENCOUNTER — Ambulatory Visit (HOSPITAL_COMMUNITY): Payer: Medicare Other

## 2018-09-23 DIAGNOSIS — I214 Non-ST elevation (NSTEMI) myocardial infarction: Secondary | ICD-10-CM

## 2018-09-23 DIAGNOSIS — Z955 Presence of coronary angioplasty implant and graft: Secondary | ICD-10-CM

## 2018-09-25 ENCOUNTER — Encounter (HOSPITAL_COMMUNITY): Payer: Medicare Other

## 2018-09-25 ENCOUNTER — Ambulatory Visit (HOSPITAL_COMMUNITY): Payer: Medicare Other

## 2018-09-28 ENCOUNTER — Ambulatory Visit (HOSPITAL_COMMUNITY): Payer: Medicare Other

## 2018-09-28 ENCOUNTER — Encounter (HOSPITAL_COMMUNITY)
Admission: RE | Admit: 2018-09-28 | Discharge: 2018-09-28 | Disposition: A | Discharge status: Home / Self Care | Payer: Medicare Other | Source: Ambulatory Visit | Attending: Internal Medicine | Admitting: Internal Medicine

## 2018-09-28 DIAGNOSIS — Z955 Presence of coronary angioplasty implant and graft: Secondary | ICD-10-CM

## 2018-09-28 DIAGNOSIS — I214 Non-ST elevation (NSTEMI) myocardial infarction: Secondary | ICD-10-CM

## 2018-09-30 ENCOUNTER — Ambulatory Visit (HOSPITAL_COMMUNITY): Payer: Medicare Other

## 2018-09-30 ENCOUNTER — Encounter (HOSPITAL_COMMUNITY)
Admission: RE | Admit: 2018-09-30 | Discharge: 2018-09-30 | Disposition: A | Discharge status: Home / Self Care | Payer: Medicare Other | Source: Ambulatory Visit | Attending: Internal Medicine | Admitting: Internal Medicine

## 2018-09-30 DIAGNOSIS — I214 Non-ST elevation (NSTEMI) myocardial infarction: Secondary | ICD-10-CM

## 2018-09-30 DIAGNOSIS — Z955 Presence of coronary angioplasty implant and graft: Secondary | ICD-10-CM

## 2018-10-02 ENCOUNTER — Encounter (HOSPITAL_COMMUNITY)
Admission: RE | Admit: 2018-10-02 | Discharge: 2018-10-02 | Disposition: A | Discharge status: Home / Self Care | Payer: Medicare Other | Source: Ambulatory Visit | Attending: Internal Medicine | Admitting: Internal Medicine

## 2018-10-02 ENCOUNTER — Ambulatory Visit (HOSPITAL_COMMUNITY): Payer: Medicare Other

## 2018-10-02 DIAGNOSIS — I214 Non-ST elevation (NSTEMI) myocardial infarction: Secondary | ICD-10-CM | POA: Diagnosis not present

## 2018-10-02 DIAGNOSIS — Z955 Presence of coronary angioplasty implant and graft: Secondary | ICD-10-CM

## 2018-10-05 ENCOUNTER — Encounter (HOSPITAL_COMMUNITY)
Admission: RE | Admit: 2018-10-05 | Discharge: 2018-10-05 | Disposition: A | Discharge status: Home / Self Care | Payer: Medicare Other | Source: Ambulatory Visit | Attending: Internal Medicine | Admitting: Internal Medicine

## 2018-10-05 ENCOUNTER — Ambulatory Visit (HOSPITAL_COMMUNITY): Payer: Medicare Other

## 2018-10-05 DIAGNOSIS — I10 Essential (primary) hypertension: Secondary | ICD-10-CM | POA: Insufficient documentation

## 2018-10-05 DIAGNOSIS — E785 Hyperlipidemia, unspecified: Secondary | ICD-10-CM | POA: Diagnosis not present

## 2018-10-05 DIAGNOSIS — I214 Non-ST elevation (NSTEMI) myocardial infarction: Secondary | ICD-10-CM | POA: Diagnosis present

## 2018-10-05 DIAGNOSIS — Z79899 Other long term (current) drug therapy: Secondary | ICD-10-CM | POA: Diagnosis not present

## 2018-10-05 DIAGNOSIS — Z955 Presence of coronary angioplasty implant and graft: Secondary | ICD-10-CM

## 2018-10-05 DIAGNOSIS — Z7982 Long term (current) use of aspirin: Secondary | ICD-10-CM | POA: Diagnosis not present

## 2018-10-07 ENCOUNTER — Encounter (HOSPITAL_COMMUNITY)
Admission: RE | Admit: 2018-10-07 | Discharge: 2018-10-07 | Disposition: A | Discharge status: Home / Self Care | Payer: Medicare Other | Source: Ambulatory Visit | Attending: Internal Medicine | Admitting: Internal Medicine

## 2018-10-07 ENCOUNTER — Ambulatory Visit (HOSPITAL_COMMUNITY): Payer: Medicare Other

## 2018-10-07 DIAGNOSIS — I214 Non-ST elevation (NSTEMI) myocardial infarction: Secondary | ICD-10-CM | POA: Diagnosis not present

## 2018-10-07 DIAGNOSIS — Z955 Presence of coronary angioplasty implant and graft: Secondary | ICD-10-CM

## 2018-10-08 NOTE — Progress Notes (Signed)
Cardiac Individual Treatment Plan  Patient Details  Name: Kelsey Pierce MRN: [id number] Date of Birth: November 02, 1945 Referring Provider:     CARDIAC REHAB PHASE II ORIENTATION from 07/21/2018 in [place]  Referring Provider  Dr. Debara Pickett      Initial Encounter Date:    CARDIAC REHAB PHASE II ORIENTATION from 07/21/2018 in [place]  Date  07/21/18      Visit Diagnosis: 05/04/2018 NSTEMI (non-ST elevated myocardial infarction) ([place])  05/04/2018 Stented coronary artery  Patient's Home Medications on Admission:  Current Outpatient Medications:  .  ACCU-CHEK AVIVA PLUS test strip, , Disp: , Rfl:  .  ALPRAZolam (XANAX) 0.25 MG tablet, Take 0.25 mg by mouth 3 (three) times daily as needed for anxiety. , Disp: , Rfl:  .  amLODipine (NORVASC) 10 MG tablet, Take 1 tablet (10 mg total) by mouth daily., Disp: 90 tablet, Rfl: 3 .  aspirin EC 81 MG EC tablet, Take 1 tablet (81 mg total) by mouth daily., Disp: 30 tablet, Rfl: 0 .  Cholecalciferol (VITAMIN D) 2000 units tablet, Take 2,000 Units by mouth daily., Disp: , Rfl:  .  clobetasol ointment (TEMOVATE) 0.05 %, APPLY TOPICALLY 2 (TWO) TIMES A WEEK. USE TWICE WEEKLY AS NEEDED TO CONTROL SYMPTOMS. (Patient taking differently: Apply 1 application topically 4 (four) times daily as needed (lichen sclerosus flares). ), Disp: 30 g, Rfl: 0 .  DULoxetine (CYMBALTA) 30 MG capsule, Take 30 mg by mouth daily. , Disp: , Rfl:  .  ezetimibe (ZETIA) 10 MG tablet, Take 1 tablet (10 mg total) by mouth daily., Disp: 90 tablet, Rfl: 3 .  hydrochlorothiazide (MICROZIDE) 12.5 MG capsule, Take 1 capsule (12.5 mg total) by mouth daily., Disp: 90 capsule, Rfl: 3 .  HYDROcodone-acetaminophen (NORCO/VICODIN) 5-325 MG per tablet, Take 1 tablet by mouth every 4 (four) hours as needed (pain). , Disp: , Rfl:  .  Insulin Human (INSULIN PUMP) SOLN, Inject into the skin See admin instructions. Use with Humalog,  Disp: , Rfl:  .  latanoprost (XALATAN) 0.005 % ophthalmic solution, Place 1 drop into both eyes at bedtime., Disp: , Rfl:  .  metoprolol tartrate (LOPRESSOR) 25 MG tablet, Take 0.5 tablets (12.5 mg total) by mouth 2 (two) times daily., Disp: 60 tablet, Rfl: 0 .  nitroGLYCERIN (NITROSTAT) 0.4 MG SL tablet, Place 1 tablet (0.4 mg total) under the tongue every 5 (five) minutes x 3 doses as needed for chest pain. (Patient not taking: Reported on 08/21/2018), Disp: 30 tablet, Rfl: 0 .  rosuvastatin (CRESTOR) 20 MG tablet, Take 1 tablet (20 mg total) by mouth at bedtime., Disp: 90 tablet, Rfl: 3 .  sodium fluoride (DENTA 5000 PLUS) 1.1 % CREA dental cream, Place 1 application onto teeth at bedtime., Disp: , Rfl:  .  ticagrelor (BRILINTA) 90 MG TABS tablet, Take 1 tablet (90 mg total) by mouth 2 (two) times daily., Disp: 60 tablet, Rfl: 6 .  traZODone (DESYREL) 50 MG tablet, Take 50 mg by mouth at bedtime. , Disp: , Rfl:   Past Medical History: Past Medical History:  Diagnosis Date  . Adenomatous colon polyp 2014   repeat colon in 5 years  . Bilateral cataracts   . Chest pain    2D ECHO, 05/29/2011 -EF >55%, normal; MYOVIEW, 05/29/2011 - normal  . Depression   . Diabetes ([place])   . Fracture, foot june 2014   left  . Glaucoma   . Hyperlipidemia   . Hypertension   .  Lichen sclerosus et atrophicus of the vulva 05/2014  . Neuropathy    legs  . PMB (postmenopausal bleeding)     Tobacco Use: Social History   Tobacco Use  Smoking Status Never Smoker  Smokeless Tobacco Never Used    Labs: Recent Review Flowsheet Data    Labs for ITP Cardiac and Pulmonary Rehab Latest Ref Rng & Units 05/22/2018 09/08/2018   Cholestrol 100 - 199 mg/dL 124 127   LDLCALC 0 - 99 mg/dL 49 58   HDL >39 mg/dL 60 54   Trlycerides 0 - 149 mg/dL 75 75      Capillary Blood Glucose: Lab Results  Component Value Date   GLUCAP 195 (H) 08/03/2018   GLUCAP 236 (H) 08/03/2018   GLUCAP 185 (H) 07/31/2018   GLUCAP 173  (H) 07/31/2018   GLUCAP 270 (H) 05/05/2018     Exercise Target Goals: Exercise Program Goal: Individual exercise prescription set using results from initial 6 min walk test and THRR while considering  patient's activity barriers and safety.   Exercise Prescription Goal: Initial exercise prescription builds to 30-45 minutes a day of aerobic activity, 2-3 days per week.  Home exercise guidelines will be given to patient during program as part of exercise prescription that the participant will acknowledge.  Activity Barriers & Risk Stratification: Activity Barriers & Cardiac Risk Stratification - 07/21/18 0951      Activity Barriers & Cardiac Risk Stratification   Activity Barriers  Deconditioning;Muscular Weakness    Cardiac Risk Stratification  High       6 Minute Walk: 6 Minute Walk    Row Name 07/21/18 0948         6 Minute Walk   Phase  Initial     Distance  1400 feet     Walk Time  6 minutes     # of Rest Breaks  0     MPH  2.65     METS  2.65     RPE  11     VO2 Peak  9.28     Symptoms  No     Resting HR  63 bpm     Resting BP  140/64     Resting Oxygen Saturation   100 %     Exercise Oxygen Saturation  during 6 min walk  97 %     Max Ex. HR  86 bpm     Max Ex. BP  142/70     2 Minute Post BP  104/60        Oxygen Initial Assessment:   Oxygen Re-Evaluation:   Oxygen Discharge (Final Oxygen Re-Evaluation):   Initial Exercise Prescription: Initial Exercise Prescription - 07/21/18 1100      Date of Initial Exercise RX and Referring Provider   Date  07/21/18    Referring Provider  Dr. Debara Pickett    Expected Discharge Date  10/31/18      NuStep   Level  3    SPM  85    Minutes  10    METs  2.7      Arm Ergometer   Level  1    Watts  33    Minutes  10    METs  3.2      Track   Laps  9    Minutes  10    METs  2.53      Prescription Details   Frequency (times per week)  3    Duration  Progress  to 30 minutes of continuous aerobic without  signs/symptoms of physical distress      Intensity   THRR 40-80% of Max Heartrate  58-117    Ratings of Perceived Exertion  11-13      Progression   Progression  Continue to progress workloads to maintain intensity without signs/symptoms of physical distress.      Resistance Training   Training Prescription  Yes    Weight  3 lbs.     Reps  10-15       Perform Capillary Blood Glucose checks as needed.  Exercise Prescription Changes: Exercise Prescription Changes    Row Name 07/31/18 1006 08/03/18 0955 08/17/18 0953 08/31/18 1005 09/14/18 0950     Response to Exercise   Blood Pressure (Admit)  98/60 recheck BP 122/70  132/72  122/72  118/64  118/80   Blood Pressure (Exercise)  142/60  142/64  148/72  142/72  122/72   Blood Pressure (Exit)  130/70  122/60  122/60  100/60  110/56   Heart Rate (Admit)  56 bpm  63 bpm  67 bpm  73 bpm  76 bpm   Heart Rate (Exercise)  91 bpm  104 bpm  98 bpm  106 bpm  90 bpm   Heart Rate (Exit)  66 bpm  69 bpm  77 bpm  71 bpm  73 bpm   Rating of Perceived Exertion (Exercise)  13  12  12  13  13    Symptoms  none  none  none  none  none   Comments  late start  -  -  -  -   Duration  Progress to 30 minutes of  aerobic without signs/symptoms of physical distress  Progress to 30 minutes of  aerobic without signs/symptoms of physical distress  Progress to 30 minutes of  aerobic without signs/symptoms of physical distress  Progress to 30 minutes of  aerobic without signs/symptoms of physical distress  Progress to 30 minutes of  aerobic without signs/symptoms of physical distress   Intensity  THRR unchanged  THRR unchanged  THRR unchanged  THRR unchanged  THRR unchanged     Progression   Progression  Continue to progress workloads to maintain intensity without signs/symptoms of physical distress.  Continue to progress workloads to maintain intensity without signs/symptoms of physical distress.  Continue to progress workloads to maintain intensity without  signs/symptoms of physical distress.  Continue to progress workloads to maintain intensity without signs/symptoms of physical distress.  Continue to progress workloads to maintain intensity without signs/symptoms of physical distress.   Average METs  -  2.6  3.7  3.3  3.4     Resistance Training   Training Prescription  Yes  Yes  Yes  Yes  Yes   Weight  1lbs  3 lbs.   4lbs  3lbs  3lbs   Reps  10-15  10-15  10-15  10-15  10-15   Time  10 Minutes  10 Minutes  10 Minutes  10 Minutes  10 Minutes     Interval Training   Interval Training  No  No  No  No  No     NuStep   Level  -  3  4  4  4    SPM  -  85  85  85  85   Minutes  -  10  10  10  10    METs  -  2.2  3.2  3.7  3.5  Arm Ergometer   Level  1  1  3  2  2    Watts  -  33  33  -  -   Minutes  10  10  10  10  10    METs  -  -  -  2  3     Track   Laps  15  12  18  18  16    Minutes  10  10  10  10  10    METs  3.6  3.09  4.14  4.14  3.79     Home Exercise Plan   Plans to continue exercise at  -  -  -  Home (comment) Walking at home  Home (comment) Walking at home   Frequency  -  -  -  Add 2 additional days to program exercise sessions.  Add 2 additional days to program exercise sessions.   Initial Home Exercises Provided  -  -  -  08/24/18  08/24/18   Row Name 09/28/18 0952             Response to Exercise   Blood Pressure (Admit)  108/70       Blood Pressure (Exercise)  142/80       Blood Pressure (Exit)  102/60       Heart Rate (Admit)  73 bpm       Heart Rate (Exercise)  87 bpm       Heart Rate (Exit)  67 bpm       Rating of Perceived Exertion (Exercise)  13       Symptoms  none       Duration  Progress to 30 minutes of  aerobic without signs/symptoms of physical distress       Intensity  THRR unchanged         Progression   Progression  Continue to progress workloads to maintain intensity without signs/symptoms of physical distress.       Average METs  3.7         Resistance Training   Training Prescription   Yes       Weight  3lbs       Reps  10-15       Time  10 Minutes         Interval Training   Interval Training  No         NuStep   Level  4       SPM  85       Minutes  10       METs  3.5         Arm Ergometer   Level  3       Minutes  10       METs  3.5         Track   Laps  17       Minutes  10       METs  3.99         Home Exercise Plan   Plans to continue exercise at  Home (comment) Walking at home       Frequency  Add 2 additional days to program exercise sessions.       Initial Home Exercises Provided  08/24/18          Exercise Comments: Exercise Comments    Row Name 08/03/18 1024 08/17/18 1004 08/24/18 1019 08/31/18 1005 09/14/18 1010   Exercise Comments  Reviewed METs  with patient.  Reviewed METs and goals with patient.  Reviewed HEP with pt. Pt is currently exercising at home. Will continue to monitor and progress pt as tolerated.   Reviewed METs with patient.  Reviewed METs and goals with patient.   [place] Name 09/28/18 1005           Exercise Comments  Patient returned to exercise after 2 weeks out with hospitalization. Pt toelrated exercise well without symptoms.          Exercise Goals and Review: Exercise Goals    Row Name 07/21/18 0951             Exercise Goals   Increase Physical Activity  Yes       Intervention  Provide advice, education, support and counseling about physical activity/exercise needs.;Develop an individualized exercise prescription for aerobic and resistive training based on initial evaluation findings, risk stratification, comorbidities and participant's personal goals.       Expected Outcomes  Short Term: Attend rehab on a regular basis to increase amount of physical activity.       Increase Strength and Stamina  Yes       Intervention  Provide advice, education, support and counseling about physical activity/exercise needs.;Develop an individualized exercise prescription for aerobic and resistive training based on initial  evaluation findings, risk stratification, comorbidities and participant's personal goals.       Expected Outcomes  Short Term: Increase workloads from initial exercise prescription for resistance, speed, and METs.       Able to understand and use rate of perceived exertion (RPE) scale  Yes       Intervention  Provide education and explanation on how to use RPE scale       Expected Outcomes  Short Term: Able to use RPE daily in rehab to express subjective intensity level;Long Term:  Able to use RPE to guide intensity level when exercising independently       Knowledge and understanding of Target Heart Rate Range (THRR)  Yes       Intervention  Provide education and explanation of THRR including how the numbers were predicted and where they are located for reference       Expected Outcomes  Short Term: Able to state/look up THRR;Long Term: Able to use THRR to govern intensity when exercising independently;Short Term: Able to use daily as guideline for intensity in rehab       Able to check pulse independently  Yes       Intervention  Provide education and demonstration on how to check pulse in carotid and radial arteries.;Review the importance of being able to check your own pulse for safety during independent exercise       Expected Outcomes  Short Term: Able to explain why pulse checking is important during independent exercise;Long Term: Able to check pulse independently and accurately       Understanding of Exercise Prescription  Yes       Intervention  Provide education, explanation, and written materials on patient's individual exercise prescription       Expected Outcomes  Short Term: Able to explain program exercise prescription;Long Term: Able to explain home exercise prescription to exercise independently          Exercise Goals Re-Evaluation : Exercise Goals Re-Evaluation    Row Name 08/17/18 1004 08/24/18 1020 09/14/18 1010         Exercise Goal Re-Evaluation   Exercise Goals Review   Increase Physical Activity;Able to understand and  use rate of perceived exertion (RPE) scale  Increase Physical Activity;Understanding of Exercise Prescription;Increase Strength and Stamina;Knowledge and understanding of Target Heart Rate Range (THRR);Able to understand and use rate of perceived exertion (RPE) scale;Able to check pulse independently  Increase Physical Activity;Understanding of Exercise Prescription;Increase Strength and Stamina;Knowledge and understanding of Target Heart Rate Range (THRR);Able to understand and use rate of perceived exertion (RPE) scale;Able to check pulse independently     Comments  Patient is able to understand and use RPE scale appropriately. Pt not consistently walking at home because of the weather but plans to start walking 30 minutes inside as able.  Reviewed HEP with pt. Also reviewed THRR, RPE Scale, NTG use, weather conditions, endpoints of exercise, warmup and cool down.   Patient states that she's had some increase in her energy level since starting the program. Pt is walking most days/week, weather permitting. Pt's goal is to be able to walk 68miles on walking track at her church.     Expected Outcomes  Patient will walk 30 minutes, 1-2 days/week in addition to exercise at cardiac rehab.  Pt will continue to walk 2 days a week for 30-45 minutes. Pt will continue to increase cardiorespiratory fitness. Will continue to monitor.   Patient will increase workloads as tolerated to help improve energy level and walking capacity.        Discharge Exercise Prescription (Final Exercise Prescription Changes): Exercise Prescription Changes - 09/28/18 0952      Response to Exercise   Blood Pressure (Admit)  108/70    Blood Pressure (Exercise)  142/80    Blood Pressure (Exit)  102/60    Heart Rate (Admit)  73 bpm    Heart Rate (Exercise)  87 bpm    Heart Rate (Exit)  67 bpm    Rating of Perceived Exertion (Exercise)  13    Symptoms  none    Duration  Progress to 30  minutes of  aerobic without signs/symptoms of physical distress    Intensity  THRR unchanged      Progression   Progression  Continue to progress workloads to maintain intensity without signs/symptoms of physical distress.    Average METs  3.7      Resistance Training   Training Prescription  Yes    Weight  3lbs    Reps  10-15    Time  10 Minutes      Interval Training   Interval Training  No      NuStep   Level  4    SPM  85    Minutes  10    METs  3.5      Arm Ergometer   Level  3    Minutes  10    METs  3.5      Track   Laps  17    Minutes  10    METs  3.99      Home Exercise Plan   Plans to continue exercise at  Home (comment)   Walking at home   Frequency  Add 2 additional days to program exercise sessions.    Initial Home Exercises Provided  08/24/18       Nutrition:  Target Goals: Understanding of nutrition guidelines, daily intake of sodium 1500mg , cholesterol 200mg , calories 30% from fat and 7% or less from saturated fats, daily to have 5 or more servings of fruits and vegetables.  Biometrics: Pre Biometrics - 07/21/18 0949      Pre Biometrics  Height  5\' 3"  (1.6 m)    Weight  73.5 kg    Waist Circumference  36 inches    Hip Circumference  46 inches    Waist to Hip Ratio  0.78 %    BMI (Calculated)  28.71    Triceps Skinfold  34 mm    % Body Fat  28.7 %    Grip Strength  23 kg    Flexibility  14.5 in    Single Leg Stand  4.31 seconds        Nutrition Therapy Plan and Nutrition Goals: Nutrition Therapy & Goals - 07/21/18 0946      Nutrition Therapy   Diet  heart healthy, diabetic       Personal Nutrition Goals   Nutrition Goal  Pt to identify, develop, and use a set schedule for eating    Personal Goal #2  Pt to identify food quantities necessary to achieve weight loss of 6-24 lbs. at graduation from cardiac rehab      [place], educate and counsel regarding individualized specific dietary  modifications aiming towards targeted core components such as weight, hypertension, lipid management, diabetes, heart failure and other comorbidities.    Expected Outcomes  Short Term Goal: Understand basic principles of dietary content, such as calories, fat, sodium, cholesterol and nutrients.;Long Term Goal: Adherence to prescribed nutrition plan.       Nutrition Assessments: Nutrition Assessments - 08/10/18 1053      MEDFICTS Scores   Pre Score  6       Nutrition Goals Re-Evaluation:   Nutrition Goals Re-Evaluation:   Nutrition Goals Discharge (Final Nutrition Goals Re-Evaluation):   Psychosocial: Target Goals: Acknowledge presence or absence of significant depression and/or stress, maximize coping skills, provide positive support system. Participant is able to verbalize types and ability to use techniques and skills needed for reducing stress and depression.  Initial Review & Psychosocial Screening: Initial Psych Review & Screening - 08/11/18 1542      Initial Review   Current issues with  History of Depression;Current Stress Concerns    Source of Stress Concerns  Chronic Illness;Family      Family Dynamics   Good Support System?  Yes   Raveen has her family for support     Barriers   Psychosocial barriers to participate in program  The patient should benefit from training in stress management and relaxation.      Screening Interventions   Interventions  Encouraged to exercise    Expected Outcomes  Long Term Goal: Stressors or current issues are controlled or eliminated.;Short Term goal: Utilizing psychosocial counselor, staff and physician to assist with identification of specific Stressors or current issues interfering with healing process. Setting desired goal for each stressor or current issue identified.;Short Term goal: Identification and review with participant of any Quality of Life or Depression concerns found by scoring the questionnaire.;Long Term goal: The  participant improves quality of Life and PHQ9 Scores as seen by post scores and/or verbalization of changes       Quality of Life Scores: Quality of Life - 07/21/18 0955      Quality of Life   Select  Quality of Life      Quality of Life Scores   Health/Function Pre  22.13 %    Socioeconomic Pre  16.58 %    Psych/Spiritual Pre  23.71 %    Family Pre  30 %    GLOBAL Pre  22.45 %  Scores of 19 and below usually indicate a poorer quality of life in these areas.  A difference of  2-3 points is a clinically meaningful difference.  A difference of 2-3 points in the total score of the Quality of Life Index has been associated with significant improvement in overall quality of life, self-image, physical symptoms, and general health in studies assessing change in quality of life.  PHQ-9: Recent Review Flowsheet Data    Depression screen [facility] 2/9 07/31/2018   Decreased Interest 1   Down, Depressed, Hopeless 0   PHQ - 2 Score 1     Interpretation of Total Score  Total Score Depression Severity:  1-4 = Minimal depression, 5-9 = Mild depression, 10-14 = Moderate depression, 15-19 = Moderately severe depression, 20-27 = Severe depression   Psychosocial Evaluation and Intervention:   Psychosocial Re-Evaluation: Psychosocial Re-Evaluation    [place] Name 08/11/18 1540 09/08/18 1551 10/08/18 1506         Psychosocial Re-Evaluation   Current issues with  Current Stress Concerns  Current Stress Concerns  Current Stress Concerns     Comments  -  Nadiah has not voiced any further increase in her stressors. Will continue to offer support as needed.  Tearra has not voiced any further increase in her stressors. Will continue to offer support as needed.     Interventions  Stress management education  Stress management education  Stress management education     Continue Psychosocial Services   No Follow up required  No Follow up required  -       Initial Review   Source of Stress Concerns   Chronic Illness  Chronic Illness;Family  Chronic Illness;Family        Psychosocial Discharge (Final Psychosocial Re-Evaluation): Psychosocial Re-Evaluation - 10/08/18 1506      Psychosocial Re-Evaluation   Current issues with  Current Stress Concerns    Comments  Shina has not voiced any further increase in her stressors. Will continue to offer support as needed.    Interventions  Stress management education      Initial Review   Source of Stress Concerns  Chronic Illness;Family       Vocational Rehabilitation: Provide vocational rehab assistance to qualifying candidates.   Vocational Rehab Evaluation & Intervention: Vocational Rehab - 07/21/18 1146      Initial Vocational Rehab Evaluation & Intervention   Assessment shows need for Vocational Rehabilitation  No   retired       Education: Education Goals: Education classes will be provided on a weekly basis, covering required topics. Participant will state understanding/return demonstration of topics presented.  Learning Barriers/Preferences: Learning Barriers/Preferences - 07/21/18 0957      Learning Barriers/Preferences   Learning Barriers  Sight   Glaucoma   Learning Preferences  Written Material;Individual Instruction;Group Instruction       Education Topics: Count Your Pulse:  -Group instruction provided by verbal instruction, demonstration, patient participation and written materials to support subject.  Instructors address importance of being able to find your pulse and how to count your pulse when at home without a heart monitor.  Patients get hands on experience counting their pulse with staff help and individually.   Heart Attack, Angina, and Risk Factor Modification:  -Group instruction provided by verbal instruction, video, and written materials to support subject.  Instructors address signs and symptoms of angina and heart attacks.    Also discuss risk factors for heart disease and how to make changes to  improve heart health  risk factors.   Functional Fitness:  -Group instruction provided by verbal instruction, demonstration, patient participation, and written materials to support subject.  Instructors address safety measures for doing things around the house.  Discuss how to get up and down off the floor, how to pick things up properly, how to safely get out of a chair without assistance, and balance training.   Meditation and Mindfulness:  -Group instruction provided by verbal instruction, patient participation, and written materials to support subject.  Instructor addresses importance of mindfulness and meditation practice to help reduce stress and improve awareness.  Instructor also leads participants through a meditation exercise.    Stretching for Flexibility and Mobility:  -Group instruction provided by verbal instruction, patient participation, and written materials to support subject.  Instructors lead participants through series of stretches that are designed to increase flexibility thus improving mobility.  These stretches are additional exercise for major muscle groups that are typically performed during regular warm up and cool down.   Hands Only CPR:  -Group verbal, video, and participation provides a basic overview of AHA guidelines for community CPR. Role-play of emergencies allow participants the opportunity to practice calling for help and chest compression technique with discussion of AED use.   Hypertension: -Group verbal and written instruction that provides a basic overview of hypertension including the most recent diagnostic guidelines, risk factor reduction with self-care instructions and medication management.    Nutrition I class: Heart Healthy Eating:  -Group instruction provided by PowerPoint slides, verbal discussion, and written materials to support subject matter. The instructor gives an explanation and review of the Therapeutic Lifestyle Changes diet  recommendations, which includes a discussion on lipid goals, dietary fat, sodium, fiber, plant stanol/sterol esters, sugar, and the components of a well-balanced, healthy diet.   Nutrition II class: Lifestyle Skills:  -Group instruction provided by PowerPoint slides, verbal discussion, and written materials to support subject matter. The instructor gives an explanation and review of label reading, grocery shopping for heart health, heart healthy recipe modifications, and ways to make healthier choices when eating out.   Diabetes Question & Answer:  -Group instruction provided by PowerPoint slides, verbal discussion, and written materials to support subject matter. The instructor gives an explanation and review of diabetes co-morbidities, pre- and post-prandial blood glucose goals, pre-exercise blood glucose goals, signs, symptoms, and treatment of hypoglycemia and hyperglycemia, and foot care basics.   Diabetes Blitz:  -Group instruction provided by PowerPoint slides, verbal discussion, and written materials to support subject matter. The instructor gives an explanation and review of the physiology behind type 1 and type 2 diabetes, diabetes medications and rational behind using different medications, pre- and post-prandial blood glucose recommendations and Hemoglobin A1c goals, diabetes diet, and exercise including blood glucose guidelines for exercising safely.    Portion Distortion:  -Group instruction provided by PowerPoint slides, verbal discussion, written materials, and food models to support subject matter. The instructor gives an explanation of serving size versus portion size, changes in portions sizes over the last 20 years, and what consists of a serving from each food group.   Stress Management:  -Group instruction provided by verbal instruction, video, and written materials to support subject matter.  Instructors review role of stress in heart disease and how to cope with stress  positively.     CARDIAC REHAB PHASE II EXERCISE from 09/02/2018 in [place]  Date  09/02/18  Educator  RN  Instruction Review Code  2- Demonstrated Understanding  Exercising on Your Own:  -Group instruction provided by verbal instruction, power point, and written materials to support subject.  Instructors discuss benefits of exercise, components of exercise, frequency and intensity of exercise, and end points for exercise.  Also discuss use of nitroglycerin and activating EMS.  Review options of places to exercise outside of rehab.  Review guidelines for sex with heart disease.   Cardiac Drugs I:  -Group instruction provided by verbal instruction and written materials to support subject.  Instructor reviews cardiac drug classes: antiplatelets, anticoagulants, beta blockers, and statins.  Instructor discusses reasons, side effects, and lifestyle considerations for each drug class.   Cardiac Drugs II:  -Group instruction provided by verbal instruction and written materials to support subject.  Instructor reviews cardiac drug classes: angiotensin converting enzyme inhibitors (ACE-I), angiotensin II receptor blockers (ARBs), nitrates, and calcium channel blockers.  Instructor discusses reasons, side effects, and lifestyle considerations for each drug class.   Anatomy and Physiology of the Circulatory System:  Group verbal and written instruction and models provide basic cardiac anatomy and physiology, with the coronary electrical and arterial systems. Review of: AMI, Angina, Valve disease, Heart Failure, Peripheral Artery Disease, Cardiac Arrhythmia, Pacemakers, and the ICD.   Other Education:  -Group or individual verbal, written, or video instructions that support the educational goals of the cardiac rehab program.   Holiday Eating Survival Tips:  -Group instruction provided by PowerPoint slides, verbal discussion, and written materials to support  subject matter. The instructor gives patients tips, tricks, and techniques to help them not only survive but enjoy the holidays despite the onslaught of food that accompanies the holidays.   Knowledge Questionnaire Score: Knowledge Questionnaire Score - 07/21/18 0953      Knowledge Questionnaire Score   Pre Score  15/24       Core Components/Risk Factors/Patient Goals at Admission: Personal Goals and Risk Factors at Admission - 07/21/18 0957      Core Components/Risk Factors/Patient Goals on Admission    Weight Management  Yes;Weight Maintenance;Weight Loss    Intervention  Weight Management: Develop a combined nutrition and exercise program designed to reach desired caloric intake, while maintaining appropriate intake of nutrient and fiber, sodium and fats, and appropriate energy expenditure required for the weight goal.;Weight Management: Provide education and appropriate resources to help participant work on and attain dietary goals.;Weight Management/Obesity: Establish reasonable short term and long term weight goals.    Admit Weight  162 lb 0.6 oz (73.5 kg)    Expected Outcomes  Short Term: Continue to assess and modify interventions until short term weight is achieved;Long Term: Adherence to nutrition and physical activity/exercise program aimed toward attainment of established weight goal;Weight Maintenance: Understanding of the daily nutrition guidelines, which includes 25-35% calories from fat, 7% or less cal from saturated fats, less than 200mg  cholesterol, less than 1.5gm of sodium, & 5 or more servings of fruits and vegetables daily;Weight Loss: Understanding of general recommendations for a balanced deficit meal plan, which promotes 1-2 lb weight loss per week and includes a negative energy balance of [phone] kcal/d;Understanding recommendations for meals to include 15-35% energy as protein, 25-35% energy from fat, 35-60% energy from carbohydrates, less than 200mg  of dietary  cholesterol, 20-35 gm of total fiber daily;Understanding of distribution of calorie intake throughout the day with the consumption of 4-5 meals/snacks    Diabetes  Yes    Intervention  Provide education about signs/symptoms and action to take for hypo/hyperglycemia.;Provide education about proper nutrition, including hydration, and aerobic/resistive exercise  prescription along with prescribed medications to achieve blood glucose in normal ranges: Fasting glucose 65-99 mg/dL    Expected Outcomes  Short Term: Participant verbalizes understanding of the signs/symptoms and immediate care of hyper/hypoglycemia, proper foot care and importance of medication, aerobic/resistive exercise and nutrition plan for blood glucose control.;Long Term: Attainment of HbA1C < 7%.    Hypertension  Yes    Intervention  Provide education on lifestyle modifcations including regular physical activity/exercise, weight management, moderate sodium restriction and increased consumption of fresh fruit, vegetables, and low fat dairy, alcohol moderation, and smoking cessation.;Monitor prescription use compliance.    Expected Outcomes  Short Term: Continued assessment and intervention until BP is < 140/29mm HG in hypertensive participants. < 130/40mm HG in hypertensive participants with diabetes, heart failure or chronic kidney disease.;Long Term: Maintenance of blood pressure at goal levels.    Lipids  Yes    Intervention  Provide education and support for participant on nutrition & aerobic/resistive exercise along with prescribed medications to achieve LDL 70mg , HDL >40mg .    Expected Outcomes  Short Term: Participant states understanding of desired cholesterol values and is compliant with medications prescribed. Participant is following exercise prescription and nutrition guidelines.;Long Term: Cholesterol controlled with medications as prescribed, with individualized exercise RX and with personalized nutrition plan. Value goals: LDL <  70mg , HDL > 40 mg.    Stress  Yes    Intervention  Offer individual and/or small group education and counseling on adjustment to heart disease, stress management and health-related lifestyle change. Teach and support self-help strategies.;Refer participants experiencing significant psychosocial distress to appropriate mental health specialists for further evaluation and treatment. When possible, include family members and significant others in education/counseling sessions.    Expected Outcomes  Short Term: Participant demonstrates changes in health-related behavior, relaxation and other stress management skills, ability to obtain effective social support, and compliance with psychotropic medications if prescribed.;Long Term: Emotional wellbeing is indicated by absence of clinically significant psychosocial distress or social isolation.       Core Components/Risk Factors/Patient Goals Review:  Goals and Risk Factor Review    Row Name 08/11/18 1545 09/08/18 1552 10/08/18 1507         Core Components/Risk Factors/Patient Goals Review   Personal Goals Review  Weight Management/Obesity;Lipids;Hypertension;Diabetes;Stress  Weight Management/Obesity;Lipids;Hypertension;Diabetes;Stress  Weight Management/Obesity     Review  Syvanna is off to a good start to exercise. Illeana's vital signs and CBG's have been stable at cardiac rehab. [facility] has been suspending her insulin pump during exercise.  Amirra's vital signs and CBG's have been stable. Mikaylee is doing well with exercise.  Jamy's vital signs and CBG's have been stable. Azalynn continues to do well with exercise. Kinzie is maintaining her current weight     Expected Outcomes  Brightyn will continue to participate in phase 2 cardiac rehab for exercise, nutrition and lifestyle modifications  Breanah will continue to participate in phase 2 cardiac rehab for exercise, nutrition and lifestyle modifications  Bailea will continue to participate in phase  2 cardiac rehab for exercise, nutrition and lifestyle modifications        Core Components/Risk Factors/Patient Goals at Discharge (Final Review):  Goals and Risk Factor Review - 10/08/18 1507      Core Components/Risk Factors/Patient Goals Review   Personal Goals Review  Weight Management/Obesity    Review  Shemika's vital signs and CBG's have been stable. Izora continues to do well with exercise. D[place] is maintaining her current weight    Expected Outcomes  Enid Derry  will continue to participate in phase 2 cardiac rehab for exercise, nutrition and lifestyle modifications       ITP Comments: ITP Comments    Row Name 07/21/18 0834 08/11/18 1528 09/08/18 1550 10/08/18 1506     ITP Comments  Dr. Fransico Him, Medical Director   30 Day ITP Review.  Harneet is off to a good start to exercise  30 Day ITP Review. Shonika is with good partcipation and attendance in phase 2 cardiac rehab  30 Day ITP Review. Raylen is with good partcipation and attendance in phase 2 cardiac rehab       Comments: See ITP comments.Barnet Pall, RN,BSN 10/08/2018 3:15 PM

## 2018-10-09 ENCOUNTER — Encounter (HOSPITAL_COMMUNITY)
Admission: RE | Admit: 2018-10-09 | Discharge: 2018-10-09 | Disposition: A | Discharge status: Home / Self Care | Payer: Medicare Other | Source: Ambulatory Visit | Attending: Internal Medicine | Admitting: Internal Medicine

## 2018-10-09 ENCOUNTER — Ambulatory Visit (HOSPITAL_COMMUNITY): Payer: Medicare Other

## 2018-10-09 DIAGNOSIS — I214 Non-ST elevation (NSTEMI) myocardial infarction: Secondary | ICD-10-CM | POA: Diagnosis not present

## 2018-10-09 DIAGNOSIS — Z955 Presence of coronary angioplasty implant and graft: Secondary | ICD-10-CM

## 2018-10-10 LAB — HEPATIC FUNCTION PANEL
ALT: 36 IU/L — ABNORMAL HIGH (ref 0–32)
AST: 41 IU/L — ABNORMAL HIGH (ref 0–40)
Albumin: 4.5 g/dL (ref 3.7–4.7)
Alkaline Phosphatase: 109 IU/L (ref 39–117)
Bilirubin Total: 0.4 mg/dL (ref 0.0–1.2)
Bilirubin, Direct: 0.15 mg/dL (ref 0.00–0.40)
Total Protein: 7.6 g/dL (ref 6.0–8.5)

## 2018-10-12 ENCOUNTER — Ambulatory Visit (HOSPITAL_COMMUNITY): Payer: Medicare Other

## 2018-10-12 ENCOUNTER — Encounter (HOSPITAL_COMMUNITY)
Admission: RE | Admit: 2018-10-12 | Discharge: 2018-10-12 | Disposition: A | Discharge status: Home / Self Care | Payer: Medicare Other | Source: Ambulatory Visit | Attending: Internal Medicine | Admitting: Internal Medicine

## 2018-10-12 DIAGNOSIS — I214 Non-ST elevation (NSTEMI) myocardial infarction: Secondary | ICD-10-CM

## 2018-10-12 DIAGNOSIS — Z955 Presence of coronary angioplasty implant and graft: Secondary | ICD-10-CM

## 2018-10-13 NOTE — Progress Notes (Signed)
The patient has been notified of the result and verbalized understanding.  All questions (if any) were answered. Jacqulynn Cadet, [place] 10/13/2018 5:23 PM

## 2018-10-14 ENCOUNTER — Other Ambulatory Visit: Payer: Self-pay

## 2018-10-14 ENCOUNTER — Encounter (HOSPITAL_COMMUNITY)
Admission: RE | Admit: 2018-10-14 | Discharge: 2018-10-14 | Disposition: A | Discharge status: Home / Self Care | Payer: Medicare Other | Source: Ambulatory Visit | Attending: Internal Medicine | Admitting: Internal Medicine

## 2018-10-14 ENCOUNTER — Ambulatory Visit (HOSPITAL_COMMUNITY): Payer: Medicare Other

## 2018-10-14 DIAGNOSIS — I214 Non-ST elevation (NSTEMI) myocardial infarction: Secondary | ICD-10-CM

## 2018-10-14 DIAGNOSIS — Z955 Presence of coronary angioplasty implant and graft: Secondary | ICD-10-CM

## 2018-10-16 ENCOUNTER — Other Ambulatory Visit: Payer: Self-pay

## 2018-10-16 ENCOUNTER — Ambulatory Visit (HOSPITAL_COMMUNITY): Payer: Medicare Other

## 2018-10-16 ENCOUNTER — Encounter (HOSPITAL_COMMUNITY)
Admission: RE | Admit: 2018-10-16 | Discharge: 2018-10-16 | Disposition: A | Discharge status: Home / Self Care | Payer: Medicare Other | Source: Ambulatory Visit | Attending: Internal Medicine | Admitting: Internal Medicine

## 2018-10-16 VITALS — BP 116/68 | HR 57 | Ht 63.0 in | Wt 161.2 lb

## 2018-10-16 DIAGNOSIS — I214 Non-ST elevation (NSTEMI) myocardial infarction: Secondary | ICD-10-CM | POA: Diagnosis not present

## 2018-10-16 DIAGNOSIS — Z955 Presence of coronary angioplasty implant and graft: Secondary | ICD-10-CM

## 2018-10-16 NOTE — Addendum Note (Signed)
Encounter addended by: Sol Passer on: 10/16/2018 2:13 PM  Actions taken: Visit Navigator Flowsheet section accepted

## 2018-10-19 ENCOUNTER — Encounter (HOSPITAL_COMMUNITY): Payer: Medicare Other

## 2018-10-19 ENCOUNTER — Ambulatory Visit (HOSPITAL_COMMUNITY): Payer: Medicare Other

## 2018-10-21 ENCOUNTER — Ambulatory Visit (HOSPITAL_COMMUNITY): Payer: Medicare Other

## 2018-10-21 ENCOUNTER — Encounter (HOSPITAL_COMMUNITY): Payer: Medicare Other

## 2018-10-22 ENCOUNTER — Encounter (HOSPITAL_COMMUNITY): Payer: Self-pay | Admitting: *Deleted

## 2018-10-22 DIAGNOSIS — I214 Non-ST elevation (NSTEMI) myocardial infarction: Secondary | ICD-10-CM

## 2018-10-22 DIAGNOSIS — Z955 Presence of coronary angioplasty implant and graft: Secondary | ICD-10-CM

## 2018-10-23 ENCOUNTER — Encounter (HOSPITAL_COMMUNITY): Payer: Medicare Other

## 2018-10-23 ENCOUNTER — Ambulatory Visit (HOSPITAL_COMMUNITY): Payer: Medicare Other

## 2018-10-26 ENCOUNTER — Ambulatory Visit (HOSPITAL_COMMUNITY): Payer: Medicare Other

## 2018-10-26 ENCOUNTER — Encounter (HOSPITAL_COMMUNITY): Payer: Medicare Other

## 2018-10-27 ENCOUNTER — Telehealth (HOSPITAL_COMMUNITY): Payer: Self-pay | Admitting: *Deleted

## 2018-10-27 ENCOUNTER — Encounter (HOSPITAL_COMMUNITY): Payer: Self-pay | Admitting: *Deleted

## 2018-10-27 DIAGNOSIS — I214 Non-ST elevation (NSTEMI) myocardial infarction: Secondary | ICD-10-CM

## 2018-10-27 DIAGNOSIS — Z955 Presence of coronary angioplasty implant and graft: Secondary | ICD-10-CM

## 2018-10-27 NOTE — Progress Notes (Signed)
Discharge Progress Report  Patient Details  Name: Kelsey Pierce MRN: [id number] Date of Birth: 06/25/1946 Referring Provider:     [place] from 07/21/2018 in [place]  Referring Provider  Dr. Debara Pickett       Number of Visits: 29  Reason for Discharge:  Patient reached a stable level of exercise. Patient independent in their exercise. Patient has met program and personal goals.  Smoking History:  Social History   Tobacco Use  Smoking Status Never Smoker  Smokeless Tobacco Never Used    Diagnosis:  05/04/2018 NSTEMI (non-ST elevated myocardial infarction) ([place])  05/04/2018 Stented coronary artery  ADL [facility]SD:   Initial Exercise Prescription:   Discharge Exercise Prescription (Final Exercise Prescription Changes): Exercise Prescription Changes - 10/16/18 0953      Response to Exercise   Blood Pressure (Admit)  116/68    Blood Pressure (Exercise)  162/52    Blood Pressure (Exit)  110/60    Heart Rate (Admit)  57 bpm    Heart Rate (Exercise)  102 bpm    Heart Rate (Exit)  65 bpm    Rating of Perceived Exertion (Exercise)  13    Symptoms  none    Duration  Progress to 30 minutes of  aerobic without signs/symptoms of physical distress    Intensity  THRR unchanged      Progression   Progression  Continue to progress workloads to maintain intensity without signs/symptoms of physical distress.    Average METs  3.4      Resistance Training   Training Prescription  Yes    Weight  4lbs    Reps  10-15    Time  10 Minutes      Interval Training   Interval Training  No      NuStep   Level  4    SPM  85    Minutes  10    METs  3.2      Arm Ergometer   Level  4    Minutes  10    METs  3.6      Track   Laps  --   1642 feet   Minutes  6   Walk test   METs  3.38      Home Exercise Plan   Plans to continue exercise at  Home (comment)   Walking at home   Frequency  Add 2 additional days to program  exercise sessions.    Initial Home Exercises Provided  08/24/18       Functional Capacity: 6 Minute Walk    Row Name 10/16/18 1024         6 Minute Walk   Phase  Discharge     Distance  1642 feet     Distance % Change  17.29 %     Walk Time  6 minutes     # of Rest Breaks  0     MPH  3.11     METS  3.16     RPE  13     Perceived Dyspnea   0     VO2 Peak  11.06     Symptoms  No     Resting HR  57 bpm     Resting BP  116/68     Max Ex. HR  82 bpm     Max Ex. BP  162/52     2 Minute Post BP  110/60  Psychological, QOL, Others - Outcomes: PHQ 2/9: Depression screen [facility] 2/9 10/27/2018 07/31/2018  Decreased Interest 0 1  Down, Depressed, Hopeless 0 0  PHQ - 2 Score 0 1    Quality of Life:   Personal Goals: Goals established at orientation with interventions provided to work toward goal.    Personal Goals Discharge: Goals and Risk Factor Review    Row Name 09/08/18 1552 10/08/18 1507 10/22/18 1423 10/27/18 1018       Core Components/Risk Factors/Patient Goals Review   Personal Goals Review  Weight Management/Obesity;Lipids;Hypertension;Diabetes;Stress  Weight Management/Obesity  Weight Management/Obesity  Weight Management/Obesity    Review  Sunday's vital signs and CBG's have been stable. Kelsey Pierce is doing well with exercise.  Kelsey Pierce's vital signs and CBG's have been stable. Kelsey Pierce continues to do well with exercise. Kelsey Pierce is maintaining her current weight   Exercise is currently on hold per recommended guidelines from the federal government to prevent the spread of COVID-19   Exercise is currently on hold per recommended guidelines from the federal government to prevent the spread of COVID-19    Expected Outcomes  Kelsey Pierce will continue to participate in phase 2 cardiac rehab for exercise, nutrition and lifestyle modifications  Kelsey Pierce will continue to participate in phase 2 cardiac rehab for exercise, nutrition and lifestyle modifications  Kelsey Pierce will  continue to participate in phase 2 cardiac rehab for exercise, nutrition and lifestyle modifications once exercise is resumed  Kelsey Pierce plans to continue exercise by walking and will walk at the walking track at her church when it is available       Exercise Goals and Review:   Exercise Goals Re-Evaluation: Exercise Goals Re-Evaluation    Row Name 09/14/18 1010 10/12/18 1020 10/16/18 1030 10/21/18 1115       Exercise Goal Re-Evaluation   Exercise Goals Review  Increase Physical Activity;Understanding of Exercise Prescription;Increase Strength and Stamina;Knowledge and understanding of Target Heart Rate Range (THRR);Able to understand and use rate of perceived exertion (RPE) scale;Able to check pulse independently  Increase Physical Activity;Understanding of Exercise Prescription;Increase Strength and Stamina;Knowledge and understanding of Target Heart Rate Range (THRR);Able to understand and use rate of perceived exertion (RPE) scale;Able to check pulse independently  Increase Physical Activity;Understanding of Exercise Prescription;Increase Strength and Stamina;Knowledge and understanding of Target Heart Rate Range (THRR);Able to understand and use rate of perceived exertion (RPE) scale;Able to check pulse independently  -    Comments  Patient states that she's had some increase in her energy level since starting the program. Pt is walking most days/week, weather permitting. Pt's goal is to be able to walk 54mles on walking track at her church.  Patient is doing well with exercise with a current MET level of 3.8. Pt is walking and weight training at her church gym 2 days/week. Pt plans to exercise at the church daily upon completion of the phase 2 cardiac rehab program.  Patient's functional capacity improved 17% as measured by 6MWT, strength increased 9% as measured by grip strength test. Patient will continue exercise at least 3 days/week, but her goal is to exercise daily.  Temporary department  closure due to COVID-19.    Expected Outcomes  Patient will increase workloads as tolerated to help improve energy level and walking capacity.  Continue current exercise routine to help achieve personal health and fitness goals.  Patient will exercise 30 minutes 3-7 days/week to help maintain increased strength and stamina.  -       Nutrition & Weight - Outcomes:  Post Biometrics - 10/16/18 1030       Post  Biometrics   Height  _0  (1.6 m)    Weight  161 lb 2.5 oz (73.1 kg)    Waist Circumference  31.5 inches    Hip Circumference  43.25 inches    Waist to Hip Ratio  0.73 %    BMI (Calculated)  28.55    Triceps Skinfold  38 mm    % Body Fat  40.4 %   Corrected % body fat   Grip Strength  25 kg    Flexibility  --   Unable to measure exit flexibility, room in use.   Single Leg Stand  4.31 seconds       Nutrition: Nutrition Therapy & Goals - 10/27/18 1424      Nutrition Therapy   Diet  heart healthy, diabetic       Personal Nutrition Goals   Nutrition Goal  Pt to identify, develop, and use a set schedule for eating      Intervention Plan   Intervention  Prescribe, educate and counsel regarding individualized specific dietary modifications aiming towards targeted core components such as weight, hypertension, lipid management, diabetes, heart failure and other comorbidities.    Expected Outcomes  Short Term Goal: Understand basic principles of dietary content, such as calories, fat, sodium, cholesterol and nutrients.;Long Term Goal: Adherence to prescribed nutrition plan.       Nutrition Discharge: Nutrition Assessments - 10/27/18 1424      MEDFICTS Scores   Pre Score  6    Post Score  6    Score Difference  0       Education Questionnaire Score:   Goals reviewed with patient.Pt graduated from cardiac rehab program on 10/27/18 with completion of 29 exercise sessions in Phase II. Pt maintained good attendance and progressed nicely during his participation in rehab as  evidenced by increased MET level.   Medication list reconciled. Repeat  PHQ score- 0 .  Pt has made significant lifestyle changes and should be commended for her success. Pt feels she has achieved her goals during cardiac rehab.   Pt plans to continue exercise by walking and wants to walk at the walking track at her church when available. Keyara's last day of participation at cardiac rehab was on 10/16/18. Cardiac rehab has been closed due to recommendations of the federal government to prevent the spread of COVID-19.Barnet Pall, RN,BSN 10/27/2018 2:49 PM

## 2018-10-27 NOTE — Progress Notes (Signed)
Cardiac Individual Treatment Plan  Patient Details  Name: Kelsey Pierce MRN: [id number] Date of Birth: November 02, 1945 Referring Provider:     CARDIAC REHAB PHASE II ORIENTATION from 07/21/2018 in [place]  Referring Provider  Dr. Debara Pickett      Initial Encounter Date:    CARDIAC REHAB PHASE II ORIENTATION from 07/21/2018 in [place]  Date  07/21/18      Visit Diagnosis: 05/04/2018 NSTEMI (non-ST elevated myocardial infarction) ([place])  05/04/2018 Stented coronary artery  Patient's Home Medications on Admission:  Current Outpatient Medications:  .  ACCU-CHEK AVIVA PLUS test strip, , Disp: , Rfl:  .  ALPRAZolam (XANAX) 0.25 MG tablet, Take 0.25 mg by mouth 3 (three) times daily as needed for anxiety. , Disp: , Rfl:  .  amLODipine (NORVASC) 10 MG tablet, Take 1 tablet (10 mg total) by mouth daily., Disp: 90 tablet, Rfl: 3 .  aspirin EC 81 MG EC tablet, Take 1 tablet (81 mg total) by mouth daily., Disp: 30 tablet, Rfl: 0 .  Cholecalciferol (VITAMIN D) 2000 units tablet, Take 2,000 Units by mouth daily., Disp: , Rfl:  .  clobetasol ointment (TEMOVATE) 0.05 %, APPLY TOPICALLY 2 (TWO) TIMES A WEEK. USE TWICE WEEKLY AS NEEDED TO CONTROL SYMPTOMS. (Patient taking differently: Apply 1 application topically 4 (four) times daily as needed (lichen sclerosus flares). ), Disp: 30 g, Rfl: 0 .  DULoxetine (CYMBALTA) 30 MG capsule, Take 30 mg by mouth daily. , Disp: , Rfl:  .  ezetimibe (ZETIA) 10 MG tablet, Take 1 tablet (10 mg total) by mouth daily., Disp: 90 tablet, Rfl: 3 .  hydrochlorothiazide (MICROZIDE) 12.5 MG capsule, Take 1 capsule (12.5 mg total) by mouth daily., Disp: 90 capsule, Rfl: 3 .  HYDROcodone-acetaminophen (NORCO/VICODIN) 5-325 MG per tablet, Take 1 tablet by mouth every 4 (four) hours as needed (pain). , Disp: , Rfl:  .  Insulin Human (INSULIN PUMP) SOLN, Inject into the skin See admin instructions. Use with Humalog,  Disp: , Rfl:  .  latanoprost (XALATAN) 0.005 % ophthalmic solution, Place 1 drop into both eyes at bedtime., Disp: , Rfl:  .  metoprolol tartrate (LOPRESSOR) 25 MG tablet, Take 0.5 tablets (12.5 mg total) by mouth 2 (two) times daily., Disp: 60 tablet, Rfl: 0 .  nitroGLYCERIN (NITROSTAT) 0.4 MG SL tablet, Place 1 tablet (0.4 mg total) under the tongue every 5 (five) minutes x 3 doses as needed for chest pain. (Patient not taking: Reported on 08/21/2018), Disp: 30 tablet, Rfl: 0 .  rosuvastatin (CRESTOR) 20 MG tablet, Take 1 tablet (20 mg total) by mouth at bedtime., Disp: 90 tablet, Rfl: 3 .  sodium fluoride (DENTA 5000 PLUS) 1.1 % CREA dental cream, Place 1 application onto teeth at bedtime., Disp: , Rfl:  .  ticagrelor (BRILINTA) 90 MG TABS tablet, Take 1 tablet (90 mg total) by mouth 2 (two) times daily., Disp: 60 tablet, Rfl: 6 .  traZODone (DESYREL) 50 MG tablet, Take 50 mg by mouth at bedtime. , Disp: , Rfl:   Past Medical History: Past Medical History:  Diagnosis Date  . Adenomatous colon polyp 2014   repeat colon in 5 years  . Bilateral cataracts   . Chest pain    2D ECHO, 05/29/2011 -EF >55%, normal; MYOVIEW, 05/29/2011 - normal  . Depression   . Diabetes ([place])   . Fracture, foot june 2014   left  . Glaucoma   . Hyperlipidemia   . Hypertension   .  Lichen sclerosus et atrophicus of the vulva 05/2014  . Neuropathy    legs  . PMB (postmenopausal bleeding)     Tobacco Use: Social History   Tobacco Use  Smoking Status Never Smoker  Smokeless Tobacco Never Used    Labs: Recent Review Flowsheet Data    Labs for ITP Cardiac and Pulmonary Rehab Latest Ref Rng & Units 05/22/2018 09/08/2018   Cholestrol 100 - 199 mg/dL 124 127   LDLCALC 0 - 99 mg/dL 49 58   HDL >39 mg/dL 60 54   Trlycerides 0 - 149 mg/dL 75 75      Capillary Blood Glucose: Lab Results  Component Value Date   GLUCAP 195 (H) 08/03/2018   GLUCAP 236 (H) 08/03/2018   GLUCAP 185 (H) 07/31/2018   GLUCAP 173  (H) 07/31/2018   GLUCAP 270 (H) 05/05/2018     Exercise Target Goals: Exercise Program Goal: Individual exercise prescription set using results from initial 6 min walk test and THRR while considering  patient's activity barriers and safety.   Exercise Prescription Goal: Initial exercise prescription builds to 30-45 minutes a day of aerobic activity, 2-3 days per week.  Home exercise guidelines will be given to patient during program as part of exercise prescription that the participant will acknowledge.  Activity Barriers & Risk Stratification:   6 Minute Walk: 6 Minute Walk    Row Name 10/16/18 1024         6 Minute Walk   Phase  Discharge     Distance  1642 feet     Distance % Change  17.29 %     Walk Time  6 minutes     # of Rest Breaks  0     MPH  3.11     METS  3.16     RPE  13     Perceived Dyspnea   0     VO2 Peak  11.06     Symptoms  No     Resting HR  57 bpm     Resting BP  116/68     Max Ex. HR  82 bpm     Max Ex. BP  162/52     2 Minute Post BP  110/60        Oxygen Initial Assessment:   Oxygen Re-Evaluation:   Oxygen Discharge (Final Oxygen Re-Evaluation):   Initial Exercise Prescription:   Perform Capillary Blood Glucose checks as needed.  Exercise Prescription Changes: Exercise Prescription Changes    Row Name 08/31/18 1005 09/14/18 0950 09/28/18 0952 10/12/18 0954 10/16/18 0953     Response to Exercise   Blood Pressure (Admit)  118/64  118/80  108/70  120/62  116/68   Blood Pressure (Exercise)  142/72  122/72  142/80  162/82  162/52   Blood Pressure (Exit)  100/60  110/56  102/60  120/70  110/60   Heart Rate (Admit)  73 bpm  76 bpm  73 bpm  59 bpm  57 bpm   Heart Rate (Exercise)  106 bpm  90 bpm  87 bpm  89 bpm  102 bpm   Heart Rate (Exit)  71 bpm  73 bpm  67 bpm  59 bpm  65 bpm   Rating of Perceived Exertion (Exercise)  '13  13  13  13  13   ' Symptoms  none  none  none  none  none   Duration  Progress to 30 minutes of  aerobic without  signs/symptoms of physical  distress  Progress to 30 minutes of  aerobic without signs/symptoms of physical distress  Progress to 30 minutes of  aerobic without signs/symptoms of physical distress  Progress to 30 minutes of  aerobic without signs/symptoms of physical distress  Progress to 30 minutes of  aerobic without signs/symptoms of physical distress   Intensity  THRR unchanged  THRR unchanged  THRR unchanged  THRR unchanged  THRR unchanged     Progression   Progression  Continue to progress workloads to maintain intensity without signs/symptoms of physical distress.  Continue to progress workloads to maintain intensity without signs/symptoms of physical distress.  Continue to progress workloads to maintain intensity without signs/symptoms of physical distress.  Continue to progress workloads to maintain intensity without signs/symptoms of physical distress.  Continue to progress workloads to maintain intensity without signs/symptoms of physical distress.   Average METs  3.3  3.4  3.7  3.3  3.4     Resistance Training   Training Prescription  Yes  Yes  Yes  Yes  Yes   Weight  3lbs  3lbs  3lbs  3lbs  4lbs   Reps  10-15  10-15  10-15  10-15  10-15   Time  10 Minutes  10 Minutes  10 Minutes  10 Minutes  10 Minutes     Interval Training   Interval Training  No  No  No  No  No     NuStep   Level  '4  4  4  4  4   ' SPM  85  85  85  85  85   Minutes  '10  10  10  10  10   ' METs  3.7  3.5  3.5  3.2  3.2     Arm Ergometer   Level  '2  2  3  3  4   ' Minutes  '10  10  10  10  10   ' METs  2  3  3.5  2.65  3.6     Track   Laps  '18  16  17  17  ' - 1642 feet   Minutes  '10  10  10  10  6 ' Walk test   METs  4.14  3.79  3.99  3.99  3.38     Home Exercise Plan   Plans to continue exercise at  Home (comment) Walking at home  Home (comment) Walking at home  Home (comment) Walking at home  Home (comment) Walking at home  Home (comment) Walking at home   Frequency  Add 2 additional days to program exercise  sessions.  Add 2 additional days to program exercise sessions.  Add 2 additional days to program exercise sessions.  Add 2 additional days to program exercise sessions.  Add 2 additional days to program exercise sessions.   Initial Home Exercises Provided  08/24/18  08/24/18  08/24/18  08/24/18  08/24/18      Exercise Comments: Exercise Comments    Row Name 08/31/18 1005 09/14/18 1010 09/28/18 1005 10/12/18 1020     Exercise Comments  Reviewed METs with patient.  Reviewed METs and goals with patient.  Patient returned to exercise after 2 weeks out with hospitalization. Pt toelrated exercise well without symptoms.  Reviewed METs and goals with patient.       Exercise Goals and Review:   Exercise Goals Re-Evaluation : Exercise Goals Re-Evaluation    Row Name 09/14/18 1010 10/12/18 1020 10/16/18 1030 10/21/18 1115  Exercise Goal Re-Evaluation   Exercise Goals Review  Increase Physical Activity;Understanding of Exercise Prescription;Increase Strength and Stamina;Knowledge and understanding of Target Heart Rate Range (THRR);Able to understand and use rate of perceived exertion (RPE) scale;Able to check pulse independently  Increase Physical Activity;Understanding of Exercise Prescription;Increase Strength and Stamina;Knowledge and understanding of Target Heart Rate Range (THRR);Able to understand and use rate of perceived exertion (RPE) scale;Able to check pulse independently  Increase Physical Activity;Understanding of Exercise Prescription;Increase Strength and Stamina;Knowledge and understanding of Target Heart Rate Range (THRR);Able to understand and use rate of perceived exertion (RPE) scale;Able to check pulse independently  -    Comments  Patient states that she's had some increase in her energy level since starting the program. Pt is walking most days/week, weather permitting. Pt's goal is to be able to walk 56mles on walking track at her [facility].  Patient is doing well with exercise  with a current MET level of 3.8. Pt is walking and weight training at her church gym 2 days/week. Pt plans to exercise at the church daily upon completion of the phase 2 cardiac rehab program.  Patient's functional capacity improved 17% as measured by 6MWT, strength increased 9% as measured by grip strength test. Patient will continue exercise at least 3 days/week, but her goal is to exercise daily.  Temporary department closure due to COVID-19.    Expected Outcomes  Patient will increase workloads as tolerated to help improve energy level and walking capacity.  Continue current exercise routine to help achieve personal health and fitness goals.  Patient will exercise 30 minutes 3-7 days/week to help maintain increased strength and stamina.  -       Discharge Exercise Prescription (Final Exercise Prescription Changes): Exercise Prescription Changes - 10/16/18 0953      Response to Exercise   Blood Pressure (Admit)  116/68    Blood Pressure (Exercise)  162/52    Blood Pressure (Exit)  110/60    Heart Rate (Admit)  57 bpm    Heart Rate (Exercise)  102 bpm    Heart Rate (Exit)  65 bpm    Rating of Perceived Exertion (Exercise)  13    Symptoms  none    Duration  Progress to 30 minutes of  aerobic without signs/symptoms of physical distress    Intensity  THRR unchanged      Progression   Progression  Continue to progress workloads to maintain intensity without signs/symptoms of physical distress.    Average METs  3.4      Resistance Training   Training Prescription  Yes    Weight  4lbs    Reps  10-15    Time  10 Minutes      Interval Training   Interval Training  No      NuStep   Level  4    SPM  85    Minutes  10    METs  3.2      Arm Ergometer   Level  4    Minutes  10    METs  3.6      Track   Laps  --   1642 feet   Minutes  6   Walk test   METs  3.38      Home Exercise Plan   Plans to continue exercise at  Home (comment)   Walking at home   Frequency  Add 2  additional days to program exercise sessions.    Initial Home Exercises Provided  08/24/18       Nutrition:  Target Goals: Understanding of nutrition guidelines, daily intake of sodium <1550m, cholesterol <2050m calories 30% from fat and 7% or less from saturated fats, daily to have 5 or more servings of fruits and vegetables.  Biometrics:  Post Biometrics - 10/16/18 1030       Post  Biometrics   Waist Circumference  31.5 inches    Hip Circumference  43.25 inches    Waist to Hip Ratio  0.73 %    Triceps Skinfold  38 mm    % Body Fat  --   Corrected % body fat   Grip Strength  25 kg    Single Leg Stand  4.31 seconds       Nutrition Therapy Plan and Nutrition Goals:   Nutrition Assessments:   Nutrition Goals Re-Evaluation:   Nutrition Goals Re-Evaluation:   Nutrition Goals Discharge (Final Nutrition Goals Re-Evaluation):   Psychosocial: Target Goals: Acknowledge presence or absence of significant depression and/or stress, maximize coping skills, provide positive support system. Participant is able to verbalize types and ability to use techniques and skills needed for reducing stress and depression.  Initial Review & Psychosocial Screening:   Quality of Life Scores:  Scores of 19 and below usually indicate a poorer quality of life in these areas.  A difference of  2-3 points is a clinically meaningful difference.  A difference of 2-3 points in the total score of the Quality of Life Index has been associated with significant improvement in overall quality of life, self-image, physical symptoms, and general health in studies assessing change in quality of life.  PHQ-9: Recent Review Flowsheet Data    [facility]   Decreased Interest 1   Down, Depressed, Hopeless 0   PHQ - 2 Score 1     Interpretation of Total Score  Total Score Depression Severity:  1-4 = Minimal depression, 5-9 = Mild depression, 10-14 = Moderate depression, 15-19 =  Moderately severe depression, 20-27 = Severe depression   Psychosocial Evaluation and Intervention:   Psychosocial Re-Evaluation: Psychosocial Re-Evaluation    [place] 09/08/18 1551 10/08/18 1506 10/22/18 1422         Psychosocial Re-Evaluation   Current issues with  Current Stress Concerns  Current Stress Concerns  Current Stress Concerns     Comments  Kelsey Pierce not voiced any further increase in her stressors. Will continue to offer support as needed.  Kelsey Pierce not voiced any further increase in her stressors. Will continue to offer support as needed.  Unable to assess as exercise is currently on hold.     Interventions  Stress management education  Stress management education  -     Continue Psychosocial Services   No Follow up required  -  -       Initial Review   Source of Stress Concerns  Chronic Illness;Family  Chronic Illness;Family  -        Psychosocial Discharge (Final Psychosocial Re-Evaluation): Psychosocial Re-Evaluation - 10/22/18 1422      Psychosocial Re-Evaluation   Current issues with  Current Stress Concerns    Comments  Unable to assess as exercise is currently on hold.       Vocational Rehabilitation: Provide vocational rehab assistance to qualifying candidates.   Vocational Rehab Evaluation & Intervention:   Education: Education Goals: Education classes will be provided on a weekly basis, covering required topics. Participant will state understanding/return demonstration of topics presented.  Learning Barriers/Preferences:  Education Topics: Count Your Pulse:  -Group instruction provided by verbal instruction, demonstration, patient participation and written materials to support subject.  Instructors address importance of being able to find your pulse and how to count your pulse when at home without a heart monitor.  Patients get hands on experience counting their pulse with staff help and individually.   Heart Attack, Angina, and Risk  Factor Modification:  -Group instruction provided by verbal instruction, video, and written materials to support subject.  Instructors address signs and symptoms of angina and heart attacks.    Also discuss risk factors for heart disease and how to make changes to improve heart health risk factors.   Functional Fitness:  -Group instruction provided by verbal instruction, demonstration, patient participation, and written materials to support subject.  Instructors address safety measures for doing things around the house.  Discuss how to get up and down off the floor, how to pick things up properly, how to safely get out of a chair without assistance, and balance training.   Meditation and Mindfulness:  -Group instruction provided by verbal instruction, patient participation, and written materials to support subject.  Instructor addresses importance of mindfulness and meditation practice to help reduce stress and improve awareness.  Instructor also leads participants through a meditation exercise.    Stretching for Flexibility and Mobility:  -Group instruction provided by verbal instruction, patient participation, and written materials to support subject.  Instructors lead participants through series of stretches that are designed to increase flexibility thus improving mobility.  These stretches are additional exercise for major muscle groups that are typically performed during regular warm up and cool down.   Hands Only CPR:  -Group verbal, video, and participation provides a basic overview of AHA guidelines for community CPR. Role-play of emergencies allow participants the opportunity to practice calling for help and chest compression technique with discussion of AED use.   Hypertension: -Group verbal and written instruction that provides a basic overview of hypertension including the most recent diagnostic guidelines, risk factor reduction with self-care instructions and medication  management.    Nutrition I class: Heart Healthy Eating:  -Group instruction provided by PowerPoint slides, verbal discussion, and written materials to support subject matter. The instructor gives an explanation and review of the Therapeutic Lifestyle Changes diet recommendations, which includes a discussion on lipid goals, dietary fat, sodium, fiber, plant stanol/sterol esters, sugar, and the components of a well-balanced, healthy diet.   Nutrition II class: Lifestyle Skills:  -Group instruction provided by PowerPoint slides, verbal discussion, and written materials to support subject matter. The instructor gives an explanation and review of label reading, grocery shopping for heart health, heart healthy recipe modifications, and ways to make healthier choices when eating out.   Diabetes Question & Answer:  -Group instruction provided by PowerPoint slides, verbal discussion, and written materials to support subject matter. The instructor gives an explanation and review of diabetes co-morbidities, pre- and post-prandial blood glucose goals, pre-exercise blood glucose goals, signs, symptoms, and treatment of hypoglycemia and hyperglycemia, and foot care basics.   Diabetes Blitz:  -Group instruction provided by PowerPoint slides, verbal discussion, and written materials to support subject matter. The instructor gives an explanation and review of the physiology behind type 1 and type 2 diabetes, diabetes medications and rational behind using different medications, pre- and post-prandial blood glucose recommendations and Hemoglobin A1c goals, diabetes diet, and exercise including blood glucose guidelines for exercising safely.    Portion Distortion:  -Group instruction provided by PowerPoint slides,  verbal discussion, written materials, and food models to support subject matter. The instructor gives an explanation of serving size versus portion size, changes in portions sizes over the last 20 years,  and what consists of a serving from each food group.   Stress Management:  -Group instruction provided by verbal instruction, video, and written materials to support subject matter.  Instructors review role of stress in heart disease and how to cope with stress positively.     CARDIAC REHAB PHASE II EXERCISE from 09/02/2018 in [place]  Date  09/02/18  Educator  RN  Instruction Review Code  2- Demonstrated Understanding      Exercising on Your Own:  -Group instruction provided by verbal instruction, power point, and written materials to support subject.  Instructors discuss benefits of exercise, components of exercise, frequency and intensity of exercise, and end points for exercise.  Also discuss use of nitroglycerin and activating EMS.  Review options of places to exercise outside of rehab.  Review guidelines for sex with heart disease.   Cardiac Drugs I:  -Group instruction provided by verbal instruction and written materials to support subject.  Instructor reviews cardiac drug classes: antiplatelets, anticoagulants, beta blockers, and statins.  Instructor discusses reasons, side effects, and lifestyle considerations for each drug class.   Cardiac Drugs II:  -Group instruction provided by verbal instruction and written materials to support subject.  Instructor reviews cardiac drug classes: angiotensin converting enzyme inhibitors (ACE-I), angiotensin II receptor blockers (ARBs), nitrates, and calcium channel blockers.  Instructor discusses reasons, side effects, and lifestyle considerations for each drug class.   Anatomy and Physiology of the Circulatory System:  Group verbal and written instruction and models provide basic cardiac anatomy and physiology, with the coronary electrical and arterial systems. Review of: AMI, Angina, Valve disease, Heart Failure, Peripheral Artery Disease, Cardiac Arrhythmia, Pacemakers, and the ICD.   Other Education:   -Group or individual verbal, written, or video instructions that support the educational goals of the cardiac rehab program.   Holiday Eating Survival Tips:  -Group instruction provided by PowerPoint slides, verbal discussion, and written materials to support subject matter. The instructor gives patients tips, tricks, and techniques to help them not only survive but enjoy the holidays despite the onslaught of food that accompanies the holidays.   Knowledge Questionnaire Score:   Core Components/Risk Factors/Patient Goals at Admission:   Core Components/Risk Factors/Patient Goals Review:  Goals and Risk Factor Review    Row Name 09/08/18 1552 10/08/18 1507 10/22/18 1423         Core Components/Risk Factors/Patient Goals Review   Personal Goals Review  Weight Management/Obesity;Lipids;Hypertension;Diabetes;Stress  Weight Management/Obesity  Weight Management/Obesity     Review  Kelsey Pierce's vital signs and CBG's have been stable. Kelsey Pierce is doing well with exercise.  Kelsey Pierce's vital signs and CBG's have been stable. Kelsey Pierce continues to do well with exercise. Kelsey Pierce is maintaining her current weight   Exercise is currently on hold per recommended guidelines from the federal government to prevent the spread of COVID-19     Expected Outcomes  Kelsey Pierce will continue to participate in phase 2 cardiac rehab for exercise, nutrition and lifestyle modifications  Kelsey Pierce will continue to participate in phase 2 cardiac rehab for exercise, nutrition and lifestyle modifications  Kelsey Pierce will continue to participate in phase 2 cardiac rehab for exercise, nutrition and lifestyle modifications once exercise is resumed        Core Components/Risk Factors/Patient Goals at Discharge (Final Review):  Goals  and Risk Factor Review - 10/22/18 1423      Core Components/Risk Factors/Patient Goals Review   Personal Goals Review  Weight Management/Obesity    Review   Exercise is currently on hold per recommended  guidelines from the federal government to prevent the spread of COVID-19    Expected Outcomes  Kelsey Pierce will continue to participate in phase 2 cardiac rehab for exercise, nutrition and lifestyle modifications once exercise is resumed       ITP Comments: ITP Comments    Row Name 09/08/18 1550 10/08/18 1506 10/22/18 1421       ITP Comments  30 Day ITP Review. Kelsey Pierce is with good partcipation and attendance in phase 2 cardiac rehab  30 Day ITP Review. Kelsey Pierce is with good partcipation and attendance in phase 2 cardiac rehab  30 Day ITP Review.  Exercise is currently on hold per recommended guidelines from the federal government to prevent the spread of COVID-19        Comments: See ITP comments.Barnet Pall, RN,BSN 10/27/2018 10:09 AM

## 2018-10-28 ENCOUNTER — Ambulatory Visit (HOSPITAL_COMMUNITY): Payer: Medicare Other

## 2018-10-28 ENCOUNTER — Encounter (HOSPITAL_COMMUNITY): Payer: Medicare Other

## 2018-10-30 ENCOUNTER — Ambulatory Visit (HOSPITAL_COMMUNITY): Payer: Medicare Other

## 2018-10-30 ENCOUNTER — Encounter (HOSPITAL_COMMUNITY): Payer: Medicare Other

## 2018-11-02 ENCOUNTER — Ambulatory Visit (HOSPITAL_COMMUNITY): Payer: Medicare Other

## 2018-11-02 ENCOUNTER — Encounter (HOSPITAL_COMMUNITY): Payer: Medicare Other

## 2018-11-26 ENCOUNTER — Other Ambulatory Visit: Payer: Self-pay | Admitting: Internal Medicine

## 2018-11-26 DIAGNOSIS — E785 Hyperlipidemia, unspecified: Secondary | ICD-10-CM

## 2018-11-26 DIAGNOSIS — I10 Essential (primary) hypertension: Secondary | ICD-10-CM

## 2018-11-26 NOTE — Telephone Encounter (Signed)
Amlodipine 10 mg and zetia 10 mg refilled.

## 2019-02-16 ENCOUNTER — Telehealth: Payer: Self-pay | Admitting: Internal Medicine

## 2019-02-16 NOTE — Telephone Encounter (Signed)
I called pt to confirm her appt for 02-17-19 with Dr Debara Pickett.      1. Confirm consent - "In the setting of the current Covid19 crisis, you are scheduled for a (phone or video) visit with your provider on (date) at (time).  Just as we do with many in-office visits, in order for you to participate in this visit, we must obtain consent.  If you'd like, I can send this to your mychart (if signed up) or email for you to review.  Otherwise, I can obtain your verbal consent now.  All virtual visits are billed to your insurance company just like a normal visit would be.  By agreeing to a virtual visit, we'd like you to understand that the technology does not allow for your provider to perform an examination, and thus may limit your provider's ability to fully assess your condition. If your provider identifies any concerns that need to be evaluated in person, we will make arrangements to do so.  Finally, though the technology is pretty good, we cannot assure that it will always work on either your or our end, and in the setting of a video visit, we may have to convert it to a phone-only visit.  In either situation, we cannot ensure that we have a secure connection.  Are you willing to proceed?" STAFF: Did the patient verbally acknowledge consent to telehealth visit? Document YES/NO here:  Yes     FULL LENGTH CONSENT FOR TELE-HEALTH VISIT   I hereby voluntarily request, consent and authorize [facility] and its employed or contracted physicians, physician assistants, nurse practitioners or other licensed health care professionals (the Practitioner), to provide me with telemedicine health care services (the Services") as deemed necessary by the treating Practitioner. I acknowledge and consent to receive the Services by the Practitioner via telemedicine. I understand that the telemedicine visit will involve communicating with the Practitioner through live audiovisual communication technology and the disclosure of  certain medical information by electronic transmission. I acknowledge that I have been given the opportunity to request an in-person assessment or other available alternative prior to the telemedicine visit and am voluntarily participating in the telemedicine visit.  I understand that I have the right to withhold or withdraw my consent to the use of telemedicine in the course of my care at any time, without affecting my right to future care or treatment, and that the Practitioner or I may terminate the telemedicine visit at any time. I understand that I have the right to inspect all information obtained and/or recorded in the course of the telemedicine visit and may receive copies of available information for a reasonable fee.  I understand that some of the potential risks of receiving the Services via telemedicine include:   Delay or interruption in medical evaluation due to technological equipment failure or disruption;  Information transmitted may not be sufficient (e.g. poor resolution of images) to allow for appropriate medical decision making by the Practitioner; and/or   In rare instances, security protocols could fail, causing a breach of personal health information.  Furthermore, I acknowledge that it is my responsibility to provide information about my medical history, conditions and care that is complete and accurate to the best of my ability. I acknowledge that Practitioner's advice, recommendations, and/or decision may be based on factors not within their control, such as incomplete or inaccurate data provided by me or distortions of diagnostic images or specimens that may result from electronic transmissions. I understand that the practice of  medicine is not an Chief Strategy Officer and that Practitioner makes no warranties or guarantees regarding treatment outcomes. I acknowledge that I will receive a copy of this consent concurrently upon execution via email to the email address I last provided but  may also request a printed copy by calling the office of [facility].    I understand that my insurance will be billed for this visit.   I have read or had this consent read to me.  I understand the contents of this consent, which adequately explains the benefits and risks of the Services being provided via telemedicine.   I have been provided ample opportunity to ask questions regarding this consent and the Services and have had my questions answered to my satisfaction.  I give my informed consent for the services to be provided through the use of telemedicine in my medical care  By participating in this telemedicine visit I agree to the above.

## 2019-02-17 ENCOUNTER — Encounter: Payer: Self-pay | Admitting: Internal Medicine

## 2019-02-17 ENCOUNTER — Telehealth (INDEPENDENT_AMBULATORY_CARE_PROVIDER_SITE_OTHER): Payer: Medicare Other | Admitting: Internal Medicine

## 2019-02-17 VITALS — BP 137/72 | HR 60 | Ht 62.0 in | Wt 154.0 lb

## 2019-02-17 DIAGNOSIS — E782 Mixed hyperlipidemia: Secondary | ICD-10-CM | POA: Diagnosis not present

## 2019-02-17 DIAGNOSIS — I350 Nonrheumatic aortic (valve) stenosis: Secondary | ICD-10-CM

## 2019-02-17 DIAGNOSIS — I2510 Atherosclerotic heart disease of native coronary artery without angina pectoris: Secondary | ICD-10-CM | POA: Diagnosis not present

## 2019-02-17 DIAGNOSIS — R011 Cardiac murmur, unspecified: Secondary | ICD-10-CM

## 2019-02-17 DIAGNOSIS — Z955 Presence of coronary angioplasty implant and graft: Secondary | ICD-10-CM

## 2019-02-17 DIAGNOSIS — E785 Hyperlipidemia, unspecified: Secondary | ICD-10-CM

## 2019-02-17 DIAGNOSIS — Z794 Long term (current) use of insulin: Secondary | ICD-10-CM

## 2019-02-17 DIAGNOSIS — E119 Type 2 diabetes mellitus without complications: Secondary | ICD-10-CM

## 2019-02-17 DIAGNOSIS — I10 Essential (primary) hypertension: Secondary | ICD-10-CM

## 2019-02-17 DIAGNOSIS — IMO0001 Reserved for inherently not codable concepts without codable children: Secondary | ICD-10-CM

## 2019-02-17 MED ORDER — NITROGLYCERIN 0.4 MG SL SUBL: 0.4000 mg | SUBLINGUAL_TABLET | SUBLINGUAL | 3 refills | Status: DC | PRN

## 2019-02-17 NOTE — Addendum Note (Signed)
Addended by: Fidel Levy on: 02/17/2019 09:19 AM   Modules accepted: Orders

## 2019-02-17 NOTE — Progress Notes (Signed)
Virtual Visit via Telephone Note   This visit type was conducted due to national recommendations for restrictions regarding the COVID-19 Pandemic (e.g. social distancing) in an effort to limit this patient's exposure and mitigate transmission in our community.  Due to her co-morbid illnesses, this patient is at least at moderate risk for complications without adequate follow up.  This format is felt to be most appropriate for this patient at this time.  The patient did not have access to video technology/had technical difficulties with video requiring transitioning to audio format only (telephone).  All issues noted in this document were discussed and addressed.  No physical exam could be performed with this format.  Please refer to the patient's chart for her  consent to telehealth for [facility].   Evaluation Performed:  Telephone visit  Date:  02/17/2019   ID:  Kelsey Pierce, DOB 10-19-1945, MRN [id number]  Patient Location:  [facility] [address]  Provider location:   [address] [place], [place] [address]  PCP:  Burnard Bunting, MD  Cardiologist:  Pixie Casino, MD Electrophysiologist:  None   Chief Complaint:  No complaints  History of Present Illness:    Kelsey Pierce is a 73 y.o. female who presents via audio/video conferencing for a telehealth visit today.  Kelsey Pierce is a 73 year old female patient of mine who recently underwent left heart catheterization for unstable angina was found to have 95% mid right coronary artery stenosis.  She received a [place] 28 x 3.0 mm stent which was postdilated to 3.5 mm with reduction of the stenosis to less than 10%.  She was found to have widely patent left main, 50 to 70% mid LAD stenosis and a first diagonal with about 70% segmental ostial narrowing.  Aggressive risk factor modification including aspirin and Brilinta for 1 year was recommended.  This will be concluded at the end of September.   Symptomatically she denies any chest pain or worsening shortness of breath.  Her LDL remains below 70.  Her hemoglobin A1c recently was 7.1 but generally has been better controlled.  Blood pressure is also well controlled.  She underwent lower extremity arterial Dopplers for leg pain with walking which were normal in October.  Her echo did not demonstrate any significant aortic stenosis although she has had this in the past.  LVEF was normal in September.  The patient does not have symptoms concerning for COVID-19 infection (fever, chills, cough, or new SHORTNESS OF BREATH).    Prior CV studies:   The following studies were reviewed today:  Chart reviewed Lab work  PMHx:  Past Medical History:  Diagnosis Date   Adenomatous colon polyp 2014   repeat colon in 5 years   Bilateral cataracts    Chest pain    2D ECHO, 05/29/2011 -EF >55%, normal; MYOVIEW, 05/29/2011 - normal   Depression    Diabetes ([place])    Fracture, foot june 2014   left   Glaucoma    Hyperlipidemia    Hypertension    Lichen sclerosus et atrophicus of the vulva 05/2014   Neuropathy    legs   PMB (postmenopausal bleeding)     Past Surgical History:  Procedure Laterality Date   CARPAL TUNNEL RELEASE Bilateral    CATARACT EXTRACTION W/ INTRAOCULAR LENS IMPLANT Bilateral 06/2015   CESAREAN [place]   CORONARY STENT INTERVENTION N/A 05/04/2018   Procedure: CORONARY STENT INTERVENTION;  Surgeon: Daneen Schick  W, MD;  Location: [facility];  Service: Cardiovascular;  Laterality: N/A;   ENDOMETRIAL BIOPSY  09-30-97   Dr Jeani [place]--benign   HYSTEROSCOPY  02-10-97   D&C, polyp--focal hyperplasia w/o atypia   LEFT HEART CATH AND CORONARY ANGIOGRAPHY N/A 05/04/2018   Procedure: LEFT HEART CATH AND CORONARY ANGIOGRAPHY;  Surgeon: Belva Crome, MD;  Location: [facility];  Service: Cardiovascular;  Laterality: N/A;   TUBAL LIGATION Bilateral     FAMHx:    Family History  Problem Relation Age of Onset   Cancer Mother        Lung cancer   Stroke Father 26   Heart disease Father    Hypertension Father    Cancer Brother        Liver cancer   Cancer Maternal Grandmother        Colon cancer   Colon cancer Maternal Grandmother 73   Heart attack Maternal Grandfather    Heart attack Paternal Grandmother    Alzheimer's disease Paternal Grandfather    Heart disease Brother 32    SOCHx:   reports that she has never smoked. She has never used smokeless tobacco. She reports that she does not drink alcohol or use drugs.  ALLERGIES:  Allergies  Allergen Reactions   Erythromycin Other (See Comments)    Abdominal pain   Lisinopril Other (See Comments)    Stopped due to acute kidney injury and hyperkalemia    MEDS:  Current Meds  Medication Sig   ACCU-CHEK AVIVA PLUS test strip    ALPRAZolam (XANAX) 0.25 MG tablet Take 0.25 mg by mouth 3 (three) times daily as needed for anxiety.    amLODipine (NORVASC) 10 MG tablet TAKE 1 TABLET BY MOUTH EVERY DAY   aspirin EC 81 MG EC tablet Take 1 tablet (81 mg total) by mouth daily.   Cholecalciferol (VITAMIN D) 2000 units tablet Take 2,000 Units by mouth daily.   clobetasol ointment (TEMOVATE) 0.05 % APPLY TOPICALLY 2 (TWO) TIMES A WEEK. USE TWICE WEEKLY AS NEEDED TO CONTROL SYMPTOMS. (Patient taking differently: Apply 1 application topically 4 (four) times daily as needed (lichen sclerosus flares). )   DULoxetine (CYMBALTA) 30 MG capsule Take 30 mg by mouth daily.    ezetimibe (ZETIA) 10 MG tablet TAKE 1 TABLET BY MOUTH EVERY DAY   HYDROcodone-acetaminophen (NORCO/VICODIN) 5-325 MG per tablet Take 1 tablet by mouth every 4 (four) hours as needed (pain).    Insulin Human (INSULIN PUMP) SOLN Inject into the skin See admin instructions. Use with Humalog   latanoprost (XALATAN) 0.005 % ophthalmic solution Place 1 drop into both eyes at bedtime.   metoprolol tartrate (LOPRESSOR)  25 MG tablet Take 0.5 tablets (12.5 mg total) by mouth 2 (two) times daily.   nitroGLYCERIN (NITROSTAT) 0.4 MG SL tablet Place 1 tablet (0.4 mg total) under the tongue every 5 (five) minutes x 3 doses as needed for chest pain.   rosuvastatin (CRESTOR) 20 MG tablet Take 1 tablet (20 mg total) by mouth at bedtime.   sodium fluoride (DENTA 5000 PLUS) 1.1 % CREA dental cream Place 1 application onto teeth at bedtime.   ticagrelor (BRILINTA) 90 MG TABS tablet Take 1 tablet (90 mg total) by mouth 2 (two) times daily.   traZODone (DESYREL) 50 MG tablet Take 50 mg by mouth at bedtime.      ROS: Pertinent items noted in HPI and remainder of comprehensive ROS otherwise negative.  Labs/Other Tests and Data Reviewed:  Recent Labs: 05/03/2018: Magnesium 1.8 05/05/2018: Hemoglobin 13.8; Platelets 250 09/08/2018: BUN 15; Creatinine, Ser 0.80; Potassium 4.1; Sodium 138 10/09/2018: ALT 36   Recent Lipid Panel Lab Results  Component Value Date/Time   CHOL 127 09/08/2018 09:54 AM   TRIG 75 09/08/2018 09:54 AM   HDL 54 09/08/2018 09:54 AM   CHOLHDL 2.4 09/08/2018 09:54 AM   LDLCALC 58 09/08/2018 09:54 AM    Wt Readings from Last 3 Encounters:  02/17/19 154 lb (69.9 kg)  10/16/18 161 lb 2.5 oz (73.1 kg)  08/21/18 166 lb (75.3 kg)     Exam:    Vital Signs:  BP 137/72    Pulse 60    Ht 5\' 2"  (1.575 m)    Wt 154 lb (69.9 kg)    LMP 08/06/1999 (Approximate)    BMI 28.17 kg/m    Exam not performed due to telephone visit  ASSESSMENT & PLAN:    1. NSTEMI-status post PCI to the RCA with DES (04/2018) - residual 50-70% mid-LAD stenosis 2. Coronary artery disease with moderate proximal LAD and RCA stenosis and a coronary calcium score of 678 (06/2015) - by CT coronary angiography 3. Hypertension-controlled 4. Anxiety  5. Palpitations - resolved 6. Insulin-dependent diabetes 7. Dyslipidemia 8. Depression 9. Murmur (mild aortic stenosis-05/2015)  Overall Kelsey Pierce is doing well.  She denies any  further anginal symptoms.  She completed most of cardiac rehabilitation until it was discontinued due to [place].  She is now doing some exercise on her own.  Her diabetes is reasonably well controlled with hemoglobin A1c of 7.1.  LDL is goal less than 70.  She does have a history of murmur and reported mild aortic stenosis in 2016 however echo in 2019 does showed normal LVEF without any significant increase gradient.  Will plan follow-up with her in 3 months at that time she can likely come off of Brilinta.  She is contemplating a an elective colonoscopy and I encouraged her to wait until we discontinue the Brilinta before I would clear her for that.  COVID-19 Education: The signs and symptoms of COVID-19 were discussed with the patient and how to seek care for testing (follow up with PCP or arrange E-visit).  The importance of social distancing was discussed today.  Patient Risk:   After full review of this patients clinical status, I feel that they are at least moderate risk at this time.  Time:   Today, I have spent 25 minutes with the patient with telehealth technology discussing coronary artery disease, anticoagulation, dyslipidemia, hypertension, diabetes.     Medication Adjustments/Labs and Tests Ordered: Current medicines are reviewed at length with the patient today.  Concerns regarding medicines are outlined above.   Tests Ordered: No orders of the defined types were placed in this encounter.   Medication Changes: No orders of the defined types were placed in this encounter.   Disposition:  in 3 month(s)  Pixie Casino, MD, [facility] [place] Director of the Advanced Lipid Disorders &  Cardiovascular Risk Reduction Clinic Diplomate of the American Board of Clinical Lipidology Attending Cardiologist  Direct Dial: [phone]   Fax: [phone]  Website:  [email or web address]  Pixie Casino, MD  02/17/2019 9:12 AM

## 2019-02-17 NOTE — Patient Instructions (Addendum)
Medication Instructions:  Your physician recommends that you continue on your current medications as directed. Please refer to the Current Medication list given to you today.  If you need a refill on your cardiac medications before your next appointment, please call your pharmacy.   Lab work: NONE If you have labs (blood work) drawn today and your tests are completely normal, you will receive your results only by: Marland Kitchen MyChart Message (if you have MyChart) OR . A paper copy in the mail If you have any lab test that is abnormal or we need to change your treatment, we will call you to review the results.  Testing/Procedures: NONE  Follow-Up: At [facility], you and your health needs are our priority.  As part of our continuing mission to provide you with exceptional heart care, we have created designated Provider Care Teams.  These Care Teams include your primary Cardiologist (physician) and Advanced Practice Providers (APPs -  Physician Assistants and Nurse Practitioners) who all work together to provide you with the care you need, when you need it. You will need a follow up appointment in 3 months. You may see Pixie Casino, MD or one of the following Advanced Practice Providers on your designated Care Team: [place], [place] . Fabian Sharp, PA-C  Any Other Special Instructions Will Be Listed Below (If Applicable).

## 2019-03-17 ENCOUNTER — Other Ambulatory Visit: Payer: Self-pay | Admitting: Internal Medicine

## 2019-05-20 ENCOUNTER — Encounter: Payer: Self-pay | Admitting: Internal Medicine

## 2019-05-20 ENCOUNTER — Ambulatory Visit: Payer: Medicare Other | Admitting: Internal Medicine

## 2019-05-20 ENCOUNTER — Other Ambulatory Visit: Payer: Self-pay

## 2019-05-20 VITALS — BP 127/68 | HR 69 | Temp 97.8°F | Ht 62.0 in | Wt 164.0 lb

## 2019-05-20 DIAGNOSIS — I2510 Atherosclerotic heart disease of native coronary artery without angina pectoris: Secondary | ICD-10-CM

## 2019-05-20 DIAGNOSIS — I10 Essential (primary) hypertension: Secondary | ICD-10-CM | POA: Diagnosis not present

## 2019-05-20 DIAGNOSIS — Z955 Presence of coronary angioplasty implant and graft: Secondary | ICD-10-CM

## 2019-05-20 DIAGNOSIS — E785 Hyperlipidemia, unspecified: Secondary | ICD-10-CM | POA: Diagnosis not present

## 2019-05-20 NOTE — Patient Instructions (Signed)
Medication Instructions:  STOP Brilinta Continue other current medications  *If you need a refill on your cardiac medications before your next appointment, please call your pharmacy*   Follow-Up: At [facility], you and your health needs are our priority.  As part of our continuing mission to provide you with exceptional heart care, we have created designated Provider Care Teams.  These Care Teams include your primary Cardiologist (physician) and Advanced Practice Providers (APPs -  Physician Assistants and Nurse Practitioners) who all work together to provide you with the care you need, when you need it.  Your next appointment:   6 months  The format for your next appointment:   Either In Person or Virtual  Provider:   You may see Pixie Casino, MD or one of the following Advanced Practice Providers on your designated Care Team:    Almyra Deforest, PA-C  Fabian Sharp, PA-C or   Roby Lo[place], [place]   Other Instructions

## 2019-05-20 NOTE — Progress Notes (Signed)
OFFICE NOTE  Chief Complaint:  Follow-up coronary disease  Primary Care Physician: Burnard Bunting, MD  HPI:  Kelsey Pierce  is a 73 year old female with a history of epigastric pain, a systolic murmur, which on echo there was only trace amount of MR and mild TR. She also had mild concentric LVH and left atrial enlargement with a normal LV systolic function. There was not  impaired diastolic dysfunction. I have talked about some of the signs of hypertensive heart disease and felt that we should treat her more aggressively for her blood pressure and recommended starting lisinopril 20 mg daily. She has since followed up with Altha Harm, our clinical pharmacist, who had added 5 mg of amlodipine for blood pressures that remained elevated. I did review a two month blood pressure chart from her, which shows very good control of her blood pressure between [id number] to [id number] systolic. Heart rate has ranged basically in the 60s and low 70s.   Kelsey Pierce returns today for followup. Symptomatically, she is doing very well. Denies any chest pain, worsening shortness of breath, or any associated problems. She is overdue for an echocardiogram to evaluate for change in her valvular heart disease.  05/20/2016  Kelsey Pierce was seen today for follow-up. At her last office visit I repeated an echocardiogram as she had a louder systolic murmur. LVEF was 65-70% with grade 1 diastolic dysfunction and very mild aortic stenosis with a mean gradient of 8 mmHg. There was a questionable mobile density in the right atrium. A CT angiogram was recommended. This was performed on 06/19/2015. No mobile density was noted, however she was found to have coronary artery disease. Specifically her coronary artery calcium score was 678. There was moderate coronary disease in the proximal LAD and RCA as well as mild concentric LVH. She was asymptomatic the time and no further testing was undertaken however she reports over the past  several months she's become progressively more fatigue, particularly with exertion. She also has less energy overall and does get short of breath doing things like raking leaves. She denies any chest pain. She reports poor sleep at night however this is a chronic problem.  07/04/2016  Kelsey Pierce was seen back today in follow-up. She underwent a nuclear stress test which is negative for ischemia. She notes an improvement in her fatigue, particularly after discontinuing her lisinopril. She was found to have some acute renal insufficiency and hyperkalemia. The lisinopril was stopped to allow for this and she says that she feels better. It may be whatever caused her to have some renal dysfunction was the culprit for her symptoms. I subsequently increased her amlodipine to 10 mg to compensate for her blood pressure change although blood pressure is mildly elevated today and she notes more leg swelling.  12/16/2016  Kelsey Pierce was seen today in follow-up. Overall seems to be doing pretty well. She denies any worsening shortness of breath. She reports walking regularly without symptoms of chest pain or dyspnea. Blood pressure control is much better. Initially was 156/76 but a recheck blood pressure came down to 130/56. She's not had any significant swelling. She does report she takes chlorthalidone at night. She gets up usually once a night to urinate and this may be improved by taking the medicine the morning. Overall her morning blood pressures around [id number] systolic. She does have mild aortic stenosis by echo in 2016. A stress test last year showed no ischemia with an EF of 68%.  12/02/2017  Kelsey Pierce returns  today for follow-up.  Overall she is without complaints.  She brought a list of blood pressures from home which indicate generally her blood pressure in the 0000000 and Q000111Q systolic.  At times she has been up to 165.  I do not see any blood pressures in the 120 range.  Today her blood pressure in the office  was 160/62.  She had labs with Dr. Reynaldo Minium last in June 2018.  Her total cholesterol is 175, HDL 71, LDL 90 and triglycerides 68.  Given her history of diabetes with cardiovascular risk, her goal LDL should be less than 70.  She is currently on rosuvastatin 20 mg daily.  05/19/2018  Kelsey Pierce is seen today in hospital follow-up.  Unfortunately she presented with chest pain concerning for unstable angina in the end of September.  Previously I had further adjusted her blood pressure and cholesterol medications.  This chest discomfort radiated through to her mid back.  She did rule in for non-STEMI with elevated troponin up to 0.23.  Ultimately she underwent left heart catheterization on 05/04/2018 which showed 95% mid RCA stenosis treated with a [place] 28 x 3 mm stent postdilated to 3.5 mm and there was additional 50 to 70% mid LAD stenosis with a diagonal vessel that was 70% stenotic.  LVEDP was normal.  LVEF was normal.  Since discharge she is done well without any worsening shortness of breath or recurrent chest pain.  Aggressive medical therapy was recommended.  She has been on aspirin and Brilinta but notes she does get shortness of breath after taking her Brilinta which is improves with caffeine.  Her lipid profile was not reassessed during hospitalization.  Despite adding Zetia in April, her last labs from PCP were in July which showed an LDL of 85, still not at goal LDL less than 70.  She is also long-standing insulin-dependent diabetic but is not on Jardiance which is independent cardiovascular risk reduction in diabetics.  08/21/2018  Kelsey Pierce is seen today in follow-up.  She is doing well after her PCI in September.  She has had some difficulty with elevated liver enzymes requiring discontinuation of high intensity statin.  She is now on rosuvastatin 20 mg daily.  A repeat lipid profile is planned for February.  She is also had some lower extremity edema.  She was taken off of her HCTZ.  He is on high  dose amlodipine.  She denies any chest pain.  She is participating cardiac rehab without difficulty.  05/20/2019  Kelsey Pierce is seen today in the office in follow-up.  I recently saw her for a virtual visit.  She is contemplating a colonoscopy however had not been a year out from her stenting.  She is remained asymptomatic denies any chest pain or worsening shortness of breath.  She completed cardiac rehab.  As she is now more than 1 year out from stenting, she could discontinue Brilinta.  Blood pressures well controlled.  She had recent lipids tested by her PCP which showed she is a target LDL less than 70.  PMHx:  Past Medical History:  Diagnosis Date   Adenomatous colon polyp 2014   repeat colon in 5 years   Bilateral cataracts    Chest pain    2D ECHO, 05/29/2011 -EF >55%, normal; MYOVIEW, 05/29/2011 - normal   Depression    Diabetes ([place])    Fracture, foot june 2014   left   Glaucoma    Hyperlipidemia    Hypertension    Lichen sclerosus et  atrophicus of the vulva 05/2014   Neuropathy    legs   PMB (postmenopausal bleeding)     Past Surgical History:  Procedure Laterality Date   CARPAL TUNNEL RELEASE Bilateral    CATARACT EXTRACTION W/ INTRAOCULAR LENS IMPLANT Bilateral 06/2015   CESAREAN SECTION  1974   CHOLECYSTECTOMY  1969   CORONARY STENT INTERVENTION N/A 05/04/2018   Procedure: CORONARY STENT INTERVENTION;  Surgeon: Belva Crome, MD;  Location: [facility];  Service: Cardiovascular;  Laterality: N/A;   ENDOMETRIAL BIOPSY  09-30-97   Dr Jeani [facility]king [place]--benign   HYSTEROSCOPY  02-10-97   D&C, polyp--focal hyperplasia w/o atypia   LEFT HEART CATH AND CORONARY ANGIOGRAPHY N/A 05/04/2018   Procedure: LEFT HEART CATH AND CORONARY ANGIOGRAPHY;  Surgeon: Belva Crome, MD;  Location: [facility];  Service: Cardiovascular;  Laterality: N/A;   TUBAL LIGATION Bilateral     FAMHx:  Family History  Problem Relation Age of Onset   Cancer Mother         Lung cancer   Stroke Father 46   Heart disease Father    Hypertension Father    Cancer Brother        Liver cancer   Cancer Maternal Grandmother        Colon cancer   Colon cancer Maternal Grandmother 71   Heart attack Maternal Grandfather    Heart attack Paternal Grandmother    Alzheimer's disease Paternal Grandfather    Heart disease Brother 31    SOCHx:   reports that she has never smoked. She has never used smokeless tobacco. She reports that she does not drink alcohol or use drugs.  ALLERGIES:  Allergies  Allergen Reactions   Erythromycin Other (See Comments)    Abdominal pain   Lisinopril Other (See Comments)    Stopped due to acute kidney injury and hyperkalemia    ROS: Pertinent items noted in HPI and remainder of comprehensive ROS otherwise negative.  HOME MEDS: Current Outpatient Medications  Medication Sig Dispense Refill   ACCU-CHEK AVIVA PLUS test strip      ALPRAZolam (XANAX) 0.25 MG tablet Take 0.25 mg by mouth 3 (three) times daily as needed for anxiety.      amLODipine (NORVASC) 10 MG tablet TAKE 1 TABLET BY MOUTH EVERY DAY 90 tablet 3   aspirin EC 81 MG EC tablet Take 1 tablet (81 mg total) by mouth daily. 30 tablet 0   Cholecalciferol (VITAMIN D) 2000 units tablet Take 2,000 Units by mouth daily.     clobetasol ointment (TEMOVATE) 0.05 % APPLY TOPICALLY 2 (TWO) TIMES A WEEK. USE TWICE WEEKLY AS NEEDED TO CONTROL SYMPTOMS. (Patient taking differently: Apply 1 application topically 4 (four) times daily as needed (lichen sclerosus flares). ) 30 g 0   DULoxetine (CYMBALTA) 30 MG capsule Take 60 mg by mouth daily.      ezetimibe (ZETIA) 10 MG tablet TAKE 1 TABLET BY MOUTH EVERY DAY 90 tablet 3   HYDROcodone-acetaminophen (NORCO/VICODIN) 5-325 MG per tablet Take 1 tablet by mouth every 4 (four) hours as needed (pain).      Insulin Human (INSULIN PUMP) SOLN Inject into the skin See admin instructions. Use with Humalog     latanoprost  (XALATAN) 0.005 % ophthalmic solution Place 1 drop into both eyes at bedtime.     metoprolol tartrate (LOPRESSOR) 25 MG tablet Take 0.5 tablets (12.5 mg total) by mouth 2 (two) times daily. 60 tablet 0   nitroGLYCERIN (NITROSTAT) 0.4 MG SL  tablet Place 1 tablet (0.4 mg total) under the tongue every 5 (five) minutes x 3 doses as needed for chest pain. 25 tablet 3   rosuvastatin (CRESTOR) 20 MG tablet Take 1 tablet (20 mg total) by mouth at bedtime. 90 tablet 3   sodium fluoride (DENTA 5000 PLUS) 1.1 % CREA dental cream Place 1 application onto teeth at bedtime.     traZODone (DESYREL) 50 MG tablet Take 50 mg by mouth at bedtime.      hydrochlorothiazide (MICROZIDE) 12.5 MG capsule Take 1 capsule (12.5 mg total) by mouth daily. 90 capsule 3   No current facility-administered medications for this visit.     LABS/IMAGING: No results found for this or any previous visit (from the past 48 hour(s)). No results found.  VITALS: BP 127/68    Pulse 69    Temp 97.8 F (36.6 C)    Ht 5\' 2"  (1.575 m)    Wt 164 lb (74.4 kg)    LMP 08/06/1999 (Approximate)    SpO2 99%    BMI 30.00 kg/m   EXAM: General appearance: alert and no distress Neck: no carotid bruit, no JVD and thyroid not enlarged, symmetric, no tenderness/mass/nodules Lungs: clear to auscultation bilaterally Heart: regular rate and rhythm, S1, S2 normal and systolic murmur: early systolic 2/6, crescendo at 2nd right intercostal space Abdomen: soft, non-tender; bowel sounds normal; no masses,  no organomegaly Extremities: edema 1+ bilateral pedal edema Pulses: 2+ and symmetric Skin: Skin color, texture, turgor normal. No rashes or lesions Neurologic: Grossly normal Psych: Pleasant  EKG: Normal sinus rhythm 65, possible left atrial enlargement-personally reviewed  ASSESSMENT: 1. NSTEMI-status post PCI to the RCA with DES (04/2018) - residual 50-70% mid-LAD stenosis 2. Coronary artery disease with moderate proximal LAD and RCA  stenosis and a coronary calcium score of 678 (06/2015) - by CT coronary angiography 3. Hypertension-controlled 4. Anxiety  5. Palpitations - resolved 6. Insulin-dependent diabetes 7. Dyslipidemia 8. Depression 9. Murmur (mild aortic stenosis-05/2015)  PLAN: 1.   Kelsey Pierce continues to do well and now is more than a year out after PCI of the right coronary.  She did have some residual mid LAD stenosis however is asymptomatic with that.  I would recommend discontinuing her Brilinta at this point we will continue on aspirin.  Should she develop exertional symptoms it may suggest that the mid LAD is significant.  I have encouraged continued exercise.  Cholesterol is at goal.  We will plan follow-up in 6 months.  She will need repeat imaging of her aortic stenosis at some point in the future.  Pixie Casino, MD, [facility] [place], [place] Director of the Advanced Lipid Disorders &  Cardiovascular Risk Reduction Clinic Diplomate of the American Board of Clinical Lipidology Attending Cardiologist  Direct Dial: [phone]   Fax: [phone]  Website:  [email or web address] Melicia Esqueda 05/20/2019, 10:07 AM

## 2019-09-04 ENCOUNTER — Other Ambulatory Visit: Payer: Self-pay | Admitting: Internal Medicine

## 2019-09-07 ENCOUNTER — Other Ambulatory Visit: Payer: Self-pay | Admitting: *Deleted

## 2019-09-07 MED ORDER — ROSUVASTATIN CALCIUM 20 MG PO TABS: 20.0000 mg | ORAL_TABLET | Freq: Every day | ORAL | 2 refills | Status: DC

## 2019-09-07 NOTE — Telephone Encounter (Signed)
Rx has been sent to the pharmacy electronically. ° °

## 2019-10-20 HISTORY — PX: YAG LASER APPLICATION: SHX6189

## 2019-10-28 ENCOUNTER — Other Ambulatory Visit: Payer: Self-pay | Admitting: Internal Medicine

## 2019-10-28 DIAGNOSIS — I10 Essential (primary) hypertension: Secondary | ICD-10-CM

## 2019-11-09 ENCOUNTER — Encounter: Payer: Self-pay | Admitting: Physician Assistant

## 2019-11-09 ENCOUNTER — Telehealth (INDEPENDENT_AMBULATORY_CARE_PROVIDER_SITE_OTHER): Payer: Medicare PPO | Admitting: Physician Assistant

## 2019-11-09 VITALS — BP 128/70 | HR 59 | Ht 62.0 in | Wt 172.0 lb

## 2019-11-09 DIAGNOSIS — E119 Type 2 diabetes mellitus without complications: Secondary | ICD-10-CM

## 2019-11-09 DIAGNOSIS — I10 Essential (primary) hypertension: Secondary | ICD-10-CM

## 2019-11-09 DIAGNOSIS — E785 Hyperlipidemia, unspecified: Secondary | ICD-10-CM | POA: Diagnosis not present

## 2019-11-09 DIAGNOSIS — I2510 Atherosclerotic heart disease of native coronary artery without angina pectoris: Secondary | ICD-10-CM | POA: Diagnosis not present

## 2019-11-09 DIAGNOSIS — I340 Nonrheumatic mitral (valve) insufficiency: Secondary | ICD-10-CM

## 2019-11-09 NOTE — Patient Instructions (Signed)
Medication Instructions:  Your physician recommends that you continue on your current medications as directed. Please refer to the Current Medication list given to you today.  *If you need a refill on your cardiac medications before your next appointment, please call your pharmacy*   Lab Work: None ordered   If you have labs (blood work) drawn today and your tests are completely normal, you will receive your results only by: . MyChart Message (if you have MyChart) OR . A paper copy in the mail If you have any lab test that is abnormal or we need to change your treatment, we will call you to review the results.   Testing/Procedures: None ordered    Follow-Up: At [facility], you and your health needs are our priority.  As part of our continuing mission to provide you with exceptional heart care, we have created designated Provider Care Teams.  These Care Teams include your primary Cardiologist (physician) and Advanced Practice Providers (APPs -  Physician Assistants and Nurse Practitioners) who all work together to provide you with the care you need, when you need it.  We recommend signing up for the patient portal called "MyChart".  Sign up information is provided on this After Visit Summary.  MyChart is used to connect with patients for Virtual Visits (Telemedicine).  Patients are able to view lab/test results, encounter notes, upcoming appointments, etc.  Non-urgent messages can be sent to your provider as well.   To learn more about what you can do with MyChart, go to [email or web address].    Your next appointment:   6 month(s)  The format for your next appointment:   In Person  Provider:   You may see Kenneth C Hilty, MD or one of the following Advanced Practice Providers on your designated Care Team:    Hao Meng, PA-C  Angela Duke, PA-C or   Krista Kroeger, PA-C    Other Instructions None   

## 2019-11-09 NOTE — Progress Notes (Signed)
Virtual Visit via Telephone Note   This visit type was conducted due to national recommendations for restrictions regarding the COVID-19 Pandemic (e.g. social distancing) in an effort to limit this patient's exposure and mitigate transmission in our community.  Due to her co-morbid illnesses, this patient is at least at moderate risk for complications without adequate follow up.  This format is felt to be most appropriate for this patient at this time.  The patient did not have access to video technology/had technical difficulties with video requiring transitioning to audio format only (telephone).  All issues noted in this document were discussed and addressed.  No physical exam could be performed with this format.  Please refer to the patient's chart for her  consent to telehealth for [facility].   The patient was identified using 2 identifiers.  Date:  11/11/2019   ID:  Kelsey Pierce, DOB September 18, 1945, MRN [id number]  Patient Location: Home Provider Location: Home  PCP:  Burnard Bunting, MD  Cardiologist:  Pixie Casino, MD Electrophysiologist:  None   Evaluation Performed:  Follow-Up Visit  Chief Complaint:  followup  History of Present Illness:    Kelsey Pierce is a 74 y.o. female with PMH of HTN, HLD, DM II, CAD and heart murmur. Last Myoview obtained on 05/30/2016 showed EF 68%, no ischemia or infarction.  She was admitted in September 2019 with NSTEMI.  Echocardiogram obtained on 05/03/2018 showed EF 55 to 60%, mild hypokinesis of inferolateral myocardium, mild MR, PA peak pressure 39 mmHg.  She underwent cardiac catheterization on 8/38 that showed 95% mid RCA lesion treated with [place] 3.0 x 28 mm DES postdilated to 3.5 mm, widely patent left main, 50 to 70% mid LAD lesion, 70% D1 lesion, widely patent left circumflex artery, EF 55 to 60% by LV gram.  Postprocedure, patient was placed on aspirin and Brilinta.    She was last seen by Dr. Debara Pickett in October 2020, Brilinta was  discontinued after completing a 1 year course.  She was instructed to continue on aspirin lifelong.  Patient presents today for cardiology office visit.  She denies any recent chest discomfort or shortness of breath.  She has no lower extremity edema, orthopnea or PND.  She can follow-up in 6 months.  The patient does not have symptoms concerning for COVID-19 infection (fever, chills, cough, or new shortness of breath).    Past Medical History:  Diagnosis Date  . Adenomatous colon polyp 2014   repeat colon in 5 years  . Bilateral cataracts   . Chest pain    2D ECHO, 05/29/2011 -EF >55%, normal; MYOVIEW, 05/29/2011 - normal  . Depression   . Diabetes ([place])   . Fracture, foot june 2014   left  . Glaucoma   . Hyperlipidemia   . Hypertension   . Lichen sclerosus et atrophicus of the vulva 05/2014  . Neuropathy    legs  . PMB (postmenopausal bleeding)    Past Surgical History:  Procedure Laterality Date  . CARPAL TUNNEL RELEASE Bilateral   . CATARACT EXTRACTION W/ INTRAOCULAR LENS IMPLANT Bilateral 06/2015  . CESAREAN SECTION  1974  . CHOLECYSTECTOMY  1969  . CORONARY STENT INTERVENTION N/A 05/04/2018   Procedure: CORONARY STENT INTERVENTION;  Surgeon: Belva Crome, MD;  Location: [place] [facility];  Service: Cardiovascular;  Laterality: N/A;  . ENDOMETRIAL BIOPSY  09-30-97   Dr Ricard Dillon  . HYSTEROSCOPY  02-10-97   D&C, polyp--focal hyperplasia w/o atypia  . LEFT HEART  CATH AND CORONARY ANGIOGRAPHY N/A 05/04/2018   Procedure: LEFT HEART CATH AND CORONARY ANGIOGRAPHY;  Surgeon: Belva Crome, MD;  Location: [place] [facility];  Service: Cardiovascular;  Laterality: N/A;  . TUBAL LIGATION Bilateral      Current Meds  Medication Sig  . ACCU-CHEK AVIVA PLUS test strip   . ALPRAZolam (XANAX) 0.25 MG tablet Take 0.25 mg by mouth 3 (three) times daily as needed for anxiety.   Marland Kitchen amLODipine (NORVASC) 10 MG tablet TAKE 1 TABLET BY MOUTH EVERY DAY  . aspirin EC 81 MG EC  tablet Take 1 tablet (81 mg total) by mouth daily.  . Cholecalciferol (VITAMIN D) 2000 units tablet Take 2,000 Units by mouth daily.  . clobetasol ointment (TEMOVATE) 0.05 % APPLY TOPICALLY 2 (TWO) TIMES A WEEK. USE TWICE WEEKLY AS NEEDED TO CONTROL SYMPTOMS. (Patient taking differently: Apply 1 application topically 4 (four) times daily as needed (lichen sclerosus flares). )  . DULoxetine (CYMBALTA) 30 MG capsule Take 60 mg by mouth daily.   Marland Kitchen ezetimibe (ZETIA) 10 MG tablet TAKE 1 TABLET BY MOUTH EVERY DAY  . hydrochlorothiazide (MICROZIDE) 12.5 MG capsule TAKE 1 CAPSULE BY MOUTH EVERY DAY  . HYDROcodone-acetaminophen (NORCO/VICODIN) 5-325 MG per tablet Take 1 tablet by mouth every 4 (four) hours as needed (pain).   . Insulin Human (INSULIN PUMP) SOLN Inject into the skin See admin instructions. Use with Humalog  . latanoprost (XALATAN) 0.005 % ophthalmic solution Place 1 drop into both eyes at bedtime.  . metoprolol tartrate (LOPRESSOR) 25 MG tablet Take 0.5 tablets (12.5 mg total) by mouth 2 (two) times daily.  . nitroGLYCERIN (NITROSTAT) 0.4 MG SL tablet Place 1 tablet (0.4 mg total) under the tongue every 5 (five) minutes x 3 doses as needed for chest pain.  . rosuvastatin (CRESTOR) 20 MG tablet Take 1 tablet (20 mg total) by mouth at bedtime.  . sodium fluoride (DENTA 5000 PLUS) 1.1 % CREA dental cream Place 1 application onto teeth at bedtime.  . traZODone (DESYREL) 50 MG tablet Take 50 mg by mouth at bedtime.      Allergies:   Erythromycin and Lisinopril   Social History   Tobacco Use  . Smoking status: Never Smoker  . Smokeless tobacco: Never Used  Substance Use Topics  . Alcohol use: No    Alcohol/week: 0.0 standard drinks  . Drug use: No     Family Hx: The patient's family history includes Alzheimer's disease in her paternal grandfather; Cancer in her brother, maternal grandmother, and mother; Colon cancer (age of onset: 98) in her maternal grandmother; Heart attack in her  maternal grandfather and paternal grandmother; Heart disease in her father; Heart disease (age of onset: 60) in her brother; Hypertension in her father; Stroke (age of onset: 106) in her father.  ROS:   Please see the history of present illness.     All other systems reviewed and are negative.   Prior CV studies:   The following studies were reviewed today:  Echo 05/03/2018 LV EF: 55% -  60%  Study Conclusions   - Left ventricle: The cavity size was normal. Wall thickness was  increased in a pattern of mild LVH. Systolic function was normal.  The estimated ejection fraction was in the range of 55% to 60%.  There is mild hypokinesis of the inferolateral myocardium. The  study is not technically sufficient to allow evaluation of LV  diastolic function.  - Mitral valve: Calcified annulus. There was mild regurgitation.  -  Right ventricle: The cavity size was mildly dilated.  - Pulmonary arteries: Systolic pressure was mildly increased. PA  peak pressure: 39 mm Hg (S).   Impressions:   - Mild hypokinesis of the inferolateral wall with overall preserved  LV systolic function; mild LVH; mild MR; mild RVE; mild TR with  mild pulmonary hypertension.    Cath 05/04/2018  High-grade culprit obstruction in the mid right coronary, 95%.  The culprit was treated with angioplasty and stenting using a [place] 28 x 3.0 mm stent postdilated to 3.5 mm at 15 atm reducing the stenosis to less than 10% with TIMI grade III flow.  Widely patent left main  50 to 70% mid LAD.  First diagonal contains 70% segmental ostial to proximal narrowing.  Circumflex artery is widely patent without significant obstructive disease.  Normal left ventricular function with EF 55 to 60%.  LVEDP is normal at 14 mmHg  RECOMMENDATIONS:   Aspirin and Brilinta for 12 months.  Aggressive risk factor modification: A1c less than 7, LDL less than 70, 150 minutes of moderate aerobic activity per week,  target blood pressure 130/80 mmHg.  Recommend uninterrupted dual antiplatelet therapy with Aspirin 39m daily and Ticagrelor 971mtwice daily for a minimum of 12 months (ACS - Class I recommendation).  Labs/Other Tests and Data Reviewed:    EKG:  An ECG dated 05/20/2019 was personally reviewed today and demonstrated:  Normal sinus rhythm without significant ST-T wave changes  Recent Labs: No results found for requested labs within last 8760 hours.   Recent Lipid Panel Lab Results  Component Value Date/Time   CHOL 127 09/08/2018 09:54 AM   TRIG 75 09/08/2018 09:54 AM   HDL 54 09/08/2018 09:54 AM   CHOLHDL 2.4 09/08/2018 09:54 AM   LDLCALC 58 09/08/2018 09:54 AM    Wt Readings from Last 3 Encounters:  11/09/19 172 lb (78 kg)  05/20/19 164 lb (74.4 kg)  02/17/19 154 lb (69.9 kg)     Objective:    Vital Signs:  BP 128/70   Pulse (!) 59   Ht 5' 2" (1.575 m)   Wt 172 lb (78 kg)   LMP 08/06/1999 (Approximate)   BMI 31.46 kg/m    VITAL SIGNS:  reviewed  ASSESSMENT & PLAN:    1. CAD: Off of Brilinta, continue aspirin and statin.  Denies any recent chest pain  2. Mitral regurgitation: Mild on the last echocardiogram in 2019.  3. Hypertension: Blood pressure stable on current therapy  4. Hyperlipidemia: Continue with the current statin medication  5. DM2: Managed by primary care provider   COVID-19 Education: The signs and symptoms of COVID-19 were discussed with the patient and how to seek care for testing (follow up with PCP or arrange E-visit).  The importance of social distancing was discussed today.  Time:   Today, I have spent 7 min with the patient with telehealth technology discussing the above problems.     Medication Adjustments/Labs and Tests Ordered: Current medicines are reviewed at length with the patient today.  Concerns regarding medicines are outlined above.   Tests Ordered: No orders of the defined types were placed in this  encounter.   Medication Changes: No orders of the defined types were placed in this encounter.   Follow Up:  Either In Person or Virtual in 6 month(s)  Signed, HaAlmyra DeforestPAUtah4/03/2020 2:49 PM    [place]

## 2019-11-17 ENCOUNTER — Encounter (INDEPENDENT_AMBULATORY_CARE_PROVIDER_SITE_OTHER): Payer: Self-pay | Admitting: Ophthalmology

## 2019-11-17 ENCOUNTER — Ambulatory Visit (INDEPENDENT_AMBULATORY_CARE_PROVIDER_SITE_OTHER): Payer: Medicare PPO | Admitting: Ophthalmology

## 2019-11-17 ENCOUNTER — Other Ambulatory Visit: Payer: Self-pay

## 2019-11-17 DIAGNOSIS — H35371 Puckering of macula, right eye: Secondary | ICD-10-CM | POA: Diagnosis not present

## 2019-11-17 DIAGNOSIS — H43822 Vitreomacular adhesion, left eye: Secondary | ICD-10-CM | POA: Insufficient documentation

## 2019-11-17 DIAGNOSIS — E113293 Type 2 diabetes mellitus with mild nonproliferative diabetic retinopathy without macular edema, bilateral: Secondary | ICD-10-CM | POA: Diagnosis not present

## 2019-11-17 DIAGNOSIS — H35372 Puckering of macula, left eye: Secondary | ICD-10-CM

## 2019-11-17 DIAGNOSIS — H43812 Vitreous degeneration, left eye: Secondary | ICD-10-CM | POA: Diagnosis not present

## 2019-11-17 DIAGNOSIS — E113393 Type 2 diabetes mellitus with moderate nonproliferative diabetic retinopathy without macular edema, bilateral: Secondary | ICD-10-CM | POA: Insufficient documentation

## 2019-11-17 DIAGNOSIS — H401131 Primary open-angle glaucoma, bilateral, mild stage: Secondary | ICD-10-CM | POA: Diagnosis not present

## 2019-11-17 NOTE — Assessment & Plan Note (Signed)

## 2019-11-17 NOTE — Progress Notes (Signed)
11/17/2019     CHIEF COMPLAINT Patient presents for Diabetic Eye Exam   HISTORY OF PRESENT ILLNESS: Kelsey Pierce is a 74 y.o. female who presents to the clinic today for:   HPI    Diabetic Eye Exam    Vision is stable.  Associated Symptoms Floaters.  Diabetes characteristics include Type 1.  Blood sugar level is controlled.  Last A1C 8.  I, the attending physician,  performed the HPI with the patient and updated documentation appropriately.          Comments    4 Week f\u   S/p yag capsulotomy OS. OCT  Pt states she has been seeing a spot in vision, unsure which eye. Pt states she noticed the spot this AM. Pt noticed a slight VA improvement after YAG.       Last edited by Hurman Horn, MD on 11/17/2019  1:01 PM. (History)      Referring physician: Burnard Bunting, MD [place],  [place] [id number]  HISTORICAL INFORMATION:   Selected notes from the MEDICAL RECORD NUMBER       CURRENT MEDICATIONS: Current Outpatient Medications (Ophthalmic Drugs)  Medication Sig  . latanoprost (XALATAN) 0.005 % ophthalmic solution Place 1 drop into both eyes at bedtime.   No current facility-administered medications for this visit. (Ophthalmic Drugs)   Current Outpatient Medications (Other)  Medication Sig  . ACCU-CHEK AVIVA PLUS test strip   . ALPRAZolam (XANAX) 0.25 MG tablet Take 0.25 mg by mouth 3 (three) times daily as needed for anxiety.   Marland Kitchen amLODipine (NORVASC) 10 MG tablet TAKE 1 TABLET BY MOUTH EVERY DAY  . aspirin EC 81 MG EC tablet Take 1 tablet (81 mg total) by mouth daily.  . Cholecalciferol (VITAMIN D) 2000 units tablet Take 2,000 Units by mouth daily.  . clobetasol ointment (TEMOVATE) 0.05 % APPLY TOPICALLY 2 (TWO) TIMES A WEEK. USE TWICE WEEKLY AS NEEDED TO CONTROL SYMPTOMS. (Patient taking differently: Apply 1 application topically 4 (four) times daily as needed (lichen sclerosus flares). )  . DULoxetine (CYMBALTA) 30 MG capsule Take 60 mg by mouth  daily.   Marland Kitchen ezetimibe (ZETIA) 10 MG tablet TAKE 1 TABLET BY MOUTH EVERY DAY  . hydrochlorothiazide (MICROZIDE) 12.5 MG capsule TAKE 1 CAPSULE BY MOUTH EVERY DAY  . HYDROcodone-acetaminophen (NORCO/VICODIN) 5-325 MG per tablet Take 1 tablet by mouth every 4 (four) hours as needed (pain).   . Insulin Human (INSULIN PUMP) SOLN Inject into the skin See admin instructions. Use with Humalog  . metoprolol tartrate (LOPRESSOR) 25 MG tablet Take 0.5 tablets (12.5 mg total) by mouth 2 (two) times daily.  . nitroGLYCERIN (NITROSTAT) 0.4 MG SL tablet Place 1 tablet (0.4 mg total) under the tongue every 5 (five) minutes x 3 doses as needed for chest pain.  . rosuvastatin (CRESTOR) 20 MG tablet Take 1 tablet (20 mg total) by mouth at bedtime.  . sodium fluoride (DENTA 5000 PLUS) 1.1 % CREA dental cream Place 1 application onto teeth at bedtime.  . traZODone (DESYREL) 50 MG tablet Take 50 mg by mouth at bedtime.    No current facility-administered medications for this visit. (Other)      REVIEW OF SYSTEMS:    ALLERGIES Allergies  Allergen Reactions  . Erythromycin Other (See Comments)    Abdominal pain  . Lisinopril Other (See Comments)    Stopped due to acute kidney injury and hyperkalemia    PAST MEDICAL HISTORY Past Medical History:  Diagnosis Date  .  Adenomatous colon polyp 2014   repeat colon in 5 years  . Bilateral cataracts   . Chest pain    2D ECHO, 05/29/2011 -EF >55%, normal; MYOVIEW, 05/29/2011 - normal  . Depression   . Diabetes ([place])   . Fracture, foot june 2014   left  . Glaucoma   . Hyperlipidemia   . Hypertension   . Lichen sclerosus et atrophicus of the vulva 05/2014  . Neuropathy    legs  . PMB (postmenopausal bleeding)    Past Surgical History:  Procedure Laterality Date  . CARPAL TUNNEL RELEASE Bilateral   . CATARACT EXTRACTION W/ INTRAOCULAR LENS IMPLANT Bilateral 06/2015  . CESAREAN SECTION  1974  . CHOLECYSTECTOMY  1969  . CORONARY STENT INTERVENTION N/A  05/04/2018   Procedure: CORONARY STENT INTERVENTION;  Surgeon: Belva Crome, MD;  Location: [place] [facility];  Service: Cardiovascular;  Laterality: N/A;  . ENDOMETRIAL BIOPSY  09-30-97   Dr Ricard Dillon  . HYSTEROSCOPY  02-10-97   D&C, polyp--focal hyperplasia w/o atypia  . LEFT HEART CATH AND CORONARY ANGIOGRAPHY N/A 05/04/2018   Procedure: LEFT HEART CATH AND CORONARY ANGIOGRAPHY;  Surgeon: Belva Crome, MD;  Location: [place] [facility];  Service: Cardiovascular;  Laterality: N/A;  . TUBAL LIGATION Bilateral   . YAG LASER APPLICATION Left [id number]   Dr. Zadie Rhine  . YAG LASER APPLICATION Bilateral [id number]   Dr. Gershon Crane    FAMILY HISTORY Family History  Problem Relation Age of Onset  . Cancer Mother        Lung cancer  . Stroke Father 69  . Heart disease Father   . Hypertension Father   . Cancer Brother        Liver cancer  . Cancer Maternal Grandmother        Colon cancer  . Colon cancer Maternal Grandmother 27  . Heart attack Maternal Grandfather   . Heart attack Paternal Grandmother   . Alzheimer's disease Paternal Grandfather   . Heart disease Brother 68    SOCIAL HISTORY Social History   Tobacco Use  . Smoking status: Never Smoker  . Smokeless tobacco: Never Used  Substance Use Topics  . Alcohol use: No    Alcohol/week: 0.0 standard drinks  . Drug use: No         OPHTHALMIC EXAM:  Base Eye Exam    Visual Acuity (Snellen - Linear)      Right Left   Dist cc 20/50 -2 20/60   Dist ph cc NI NI   Correction: Glasses       Tonometry (Tonopen, 11:29 AM)      Right Left   Pressure 18 18       Pupils      Pupils Dark Light Shape React APD   Right PERRL 4 3.5 Round Sluggish None   Left PERRL 4 3.5 Round Sluggish None       Visual Fields (Counting fingers)      Left Right    Full    Restrictions  Partial outer superior temporal, superior nasal deficiencies       Neuro/Psych    Oriented x3: Yes   Mood/Affect: Normal       Dilation     Left eye: 1.0% Mydriacyl, 2.5% Phenylephrine @ 11:29 AM        Slit Lamp and Fundus Exam    External Exam      Right Left   External Normal Normal  Slit Lamp Exam      Right Left   Lids/Lashes Normal Normal   Conjunctiva/Sclera White and quiet White and quiet   Cornea Clear Clear   Anterior Chamber Deep and quiet Deep and quiet   Iris Round and reactive Round and reactive   Lens Posterior chamber intraocular lens Posterior chamber intraocular lens   Anterior Vitreous Normal Normal       Fundus Exam      Right Left   Posterior Vitreous Posterior vitreous detachment Posterior vitreous detachment   Disc no optic disc atrophy, no neovascularization, no pallor no optic disc atrophy, no neovascularization, no pallor   C/D Ratio 0.2 0.2   Macula Membrane, Epiretinal membrane,, Membrane, Epiretinal membrane,,   Vessels Mild nonproliferative diabetic retinopathy Mild nonproliferative diabetic retinopathy   Periphery Normal Normal          IMAGING AND PROCEDURES  Imaging and Procedures for @TODAY @           ASSESSMENT/PLAN:  Posterior vitreous detachment of left eye   The nature of posterior vitreous detachment was discussed with the patient as well as its physiology, its age prevalence, and its possible implication regarding retinal breaks and detachment.  An informational brochure was given to the patient.  All the patient's questions were answered.  The patient was asked to return if new or different flashes or floaters develops.   Patient was instructed to contact office immediately if any changes were noticed. I explained to the patient that vitreous inside the eye is similar to jello inside a bowl. As the jello melts it can start to pull away from the bowl, similarly the vitreous throughout our lives can begin to pull away from the retina. That process is called a posterior vitreous detachment. In some cases, the vitreous can tug hard enough on the retina to form a  retinal tear. I discussed with the patient the signs and symptoms of a retinal detachment.  Do not rub the eye.  Nonproliferative diabetic retinopathy of both eyes ([place]) The nature of mild nonproliferative diabetic retinopathy was discussed with the patient. Emphasis was placed on tight glucose, blood pressure, and serum lipid control. Avoidance of smoking was emphasized. Maintenance of normal body weight was emphasized. Appropriate follow up dilated exam is 1 year.      ICD-10-CM   1. Posterior vitreous detachment of left eye  H43.812   2. Nonproliferative diabetic retinopathy of both eyes ([place])  [id number]   3. Right epiretinal membrane  H35.371   4. Primary open angle glaucoma of both eyes, mild stage  H40.1131   5. Left epiretinal membrane  H35.372     1.  2.  3.  Ophthalmic Meds Ordered this visit:  No orders of the defined types were placed in this encounter.      Return in about 4 months (around 03/18/2020) for DILATE OU, COLOR FP.  There are no Patient Instructions on file for this visit.   Explained the diagnoses, plan, and follow up with the patient and they expressed understanding.  Patient expressed understanding of the importance of proper follow up care.   Clent Demark Lavonne Cass M.D. Diseases & Surgery of the Retina and Vitreous Retina & Diabetic [place] @TODAY @     Abbreviations: M myopia (nearsighted); A astigmatism; H hyperopia (farsighted); P presbyopia; Mrx spectacle prescription;  CTL contact lenses; OD right eye; OS left eye; OU both eyes  XT exotropia; ET esotropia; PEK punctate epithelial keratitis; PEE punctate epithelial erosions; DES dry  eye syndrome; MGD meibomian gland dysfunction; ATs artificial tears; PFAT's preservative free artificial tears; [place] nuclear sclerotic cataract; PSC posterior subcapsular cataract; ERM epi-retinal membrane; PVD posterior vitreous detachment; RD retinal detachment; DM diabetes mellitus; DR diabetic retinopathy; NPDR  non-proliferative diabetic retinopathy; PDR proliferative diabetic retinopathy; CSME clinically significant macular edema; DME diabetic macular edema; dbh dot blot hemorrhages; CWS cotton wool spot; POAG primary open angle glaucoma; C/D cup-to-disc ratio; HVF humphrey visual field; GVF goldmann visual field; OCT optical coherence tomography; IOP intraocular pressure; BRVO Branch retinal vein occlusion; CRVO central retinal vein occlusion; CRAO central retinal artery occlusion; BRAO branch retinal artery occlusion; RT retinal tear; SB scleral buckle; PPV pars plana vitrectomy; VH Vitreous hemorrhage; PRP panretinal laser photocoagulation; IVK intravitreal kenalog; VMT vitreomacular traction; MH Macular hole;  NVD neovascularization of the disc; NVE neovascularization elsewhere; AREDS age related eye disease study; ARMD age related macular degeneration; POAG primary open angle glaucoma; EBMD epithelial/anterior basement membrane dystrophy; ACIOL anterior chamber intraocular lens; IOL intraocular lens; PCIOL posterior chamber intraocular lens; Phaco/IOL phacoemulsification with intraocular lens placement; [place] photorefractive keratectomy; LASIK laser assisted in situ keratomileusis; HTN hypertension; DM diabetes mellitus; COPD chronic obstructive pulmonary disease

## 2019-11-17 NOTE — Assessment & Plan Note (Signed)
The nature of mild nonproliferative diabetic retinopathy was discussed with the patient. Emphasis was placed on tight glucose, blood pressure, and serum lipid control. Avoidance of smoking was emphasized. Maintenance of normal body weight was emphasized. Appropriate follow up dilated exam is 1 year. 

## 2019-12-27 ENCOUNTER — Telehealth: Payer: Self-pay | Admitting: Internal Medicine

## 2019-12-27 DIAGNOSIS — E103293 Type 1 diabetes mellitus with mild nonproliferative diabetic retinopathy without macular edema, bilateral: Secondary | ICD-10-CM | POA: Diagnosis not present

## 2019-12-27 DIAGNOSIS — E1039 Type 1 diabetes mellitus with other diabetic ophthalmic complication: Secondary | ICD-10-CM | POA: Diagnosis not present

## 2019-12-27 DIAGNOSIS — M7662 Achilles tendinitis, left leg: Secondary | ICD-10-CM | POA: Diagnosis not present

## 2019-12-27 DIAGNOSIS — I10 Essential (primary) hypertension: Secondary | ICD-10-CM | POA: Diagnosis not present

## 2019-12-27 DIAGNOSIS — Z79899 Other long term (current) drug therapy: Secondary | ICD-10-CM | POA: Diagnosis not present

## 2019-12-27 DIAGNOSIS — E663 Overweight: Secondary | ICD-10-CM | POA: Diagnosis not present

## 2019-12-27 DIAGNOSIS — M1990 Unspecified osteoarthritis, unspecified site: Secondary | ICD-10-CM | POA: Diagnosis not present

## 2019-12-27 DIAGNOSIS — Z794 Long term (current) use of insulin: Secondary | ICD-10-CM | POA: Diagnosis not present

## 2019-12-27 NOTE — Telephone Encounter (Signed)
New Message    Pt c/o swelling: STAT is pt has developed SOB within 24 hours  1) How much weight have you gained and in what time span? Yes pt thinks about 5 pounds   2) If swelling, where is the swelling located? Left ankle and sometimes goes into leg   3) Are you currently taking a fluid pill? Yes   4) Are you currently SOB? No   5) Do you have a log of your daily weights (if so, list)? No   6) Have you gained 3 pounds in a day or 5 pounds in a week? Pt is not sure   7) Have you traveled recently? No      Pt says she saw her Doctor today who said they would send a request for her to have an ultrasound done    Please advise

## 2019-12-27 NOTE — Telephone Encounter (Signed)
Returned the call to the patient. She stated that for a while now she has been having bilateral leg edema. This has gotten better lately but now she has noticed a "thickening around the achilles tendon." She saw her PCP today and he felt like her cardiologist should be made aware because this may be a fluid pocket. She stated that Dr. Reynaldo Minium was going to send a note to Dr. Debara Pickett to address this.  She denies any discoloration and temperature change. She stated that it hurts worse when she ambulates but does not hurt when sitting.

## 2019-12-28 NOTE — Telephone Encounter (Signed)
Ok thanks .. will await results from their ultrasound - may need to increase HCTZ.  Dr Lemmie Evens

## 2019-12-29 NOTE — Telephone Encounter (Signed)
Returned call to patient to advise her that no active order for leg ultrasound in our system. She has no swelling today and no issues. She reports swelling has caused pressure on Achilles tendon that could be causing pain in her leg and back.   She said that Dr. Reynaldo Minium told her she may have a fluid cyst. Unsure what type of ultrasound PCP is recommending - maybe a soft tissue u/s. Advised that she check with PCP on this, to which she agreed.

## 2020-02-11 DIAGNOSIS — M25572 Pain in left ankle and joints of left foot: Secondary | ICD-10-CM | POA: Diagnosis not present

## 2020-02-14 DIAGNOSIS — Z1231 Encounter for screening mammogram for malignant neoplasm of breast: Secondary | ICD-10-CM | POA: Diagnosis not present

## 2020-02-21 DIAGNOSIS — M7662 Achilles tendinitis, left leg: Secondary | ICD-10-CM | POA: Diagnosis not present

## 2020-02-21 DIAGNOSIS — M25572 Pain in left ankle and joints of left foot: Secondary | ICD-10-CM | POA: Diagnosis not present

## 2020-02-24 DIAGNOSIS — M25572 Pain in left ankle and joints of left foot: Secondary | ICD-10-CM | POA: Diagnosis not present

## 2020-02-24 DIAGNOSIS — M7662 Achilles tendinitis, left leg: Secondary | ICD-10-CM | POA: Diagnosis not present

## 2020-02-24 DIAGNOSIS — E1039 Type 1 diabetes mellitus with other diabetic ophthalmic complication: Secondary | ICD-10-CM | POA: Diagnosis not present

## 2020-02-29 DIAGNOSIS — M7662 Achilles tendinitis, left leg: Secondary | ICD-10-CM | POA: Diagnosis not present

## 2020-02-29 DIAGNOSIS — M25572 Pain in left ankle and joints of left foot: Secondary | ICD-10-CM | POA: Diagnosis not present

## 2020-03-01 ENCOUNTER — Encounter: Payer: Self-pay | Admitting: Obstetrics and Gynecology

## 2020-03-02 DIAGNOSIS — M7662 Achilles tendinitis, left leg: Secondary | ICD-10-CM | POA: Diagnosis not present

## 2020-03-02 DIAGNOSIS — M25572 Pain in left ankle and joints of left foot: Secondary | ICD-10-CM | POA: Diagnosis not present

## 2020-03-07 DIAGNOSIS — M25572 Pain in left ankle and joints of left foot: Secondary | ICD-10-CM | POA: Diagnosis not present

## 2020-03-07 DIAGNOSIS — M7662 Achilles tendinitis, left leg: Secondary | ICD-10-CM | POA: Diagnosis not present

## 2020-03-09 DIAGNOSIS — M7662 Achilles tendinitis, left leg: Secondary | ICD-10-CM | POA: Diagnosis not present

## 2020-03-09 DIAGNOSIS — M25572 Pain in left ankle and joints of left foot: Secondary | ICD-10-CM | POA: Diagnosis not present

## 2020-03-21 DIAGNOSIS — M7662 Achilles tendinitis, left leg: Secondary | ICD-10-CM | POA: Diagnosis not present

## 2020-03-21 DIAGNOSIS — M25572 Pain in left ankle and joints of left foot: Secondary | ICD-10-CM | POA: Diagnosis not present

## 2020-03-23 DIAGNOSIS — M7662 Achilles tendinitis, left leg: Secondary | ICD-10-CM | POA: Diagnosis not present

## 2020-03-23 DIAGNOSIS — M25572 Pain in left ankle and joints of left foot: Secondary | ICD-10-CM | POA: Diagnosis not present

## 2020-03-24 DIAGNOSIS — M25572 Pain in left ankle and joints of left foot: Secondary | ICD-10-CM | POA: Diagnosis not present

## 2020-03-24 DIAGNOSIS — M7662 Achilles tendinitis, left leg: Secondary | ICD-10-CM | POA: Diagnosis not present

## 2020-04-18 ENCOUNTER — Ambulatory Visit (INDEPENDENT_AMBULATORY_CARE_PROVIDER_SITE_OTHER): Payer: Medicare PPO | Admitting: Ophthalmology

## 2020-04-18 ENCOUNTER — Encounter (INDEPENDENT_AMBULATORY_CARE_PROVIDER_SITE_OTHER): Payer: Self-pay | Admitting: Ophthalmology

## 2020-04-18 ENCOUNTER — Other Ambulatory Visit: Payer: Self-pay

## 2020-04-18 DIAGNOSIS — H43812 Vitreous degeneration, left eye: Secondary | ICD-10-CM

## 2020-04-18 DIAGNOSIS — H35372 Puckering of macula, left eye: Secondary | ICD-10-CM | POA: Diagnosis not present

## 2020-04-18 DIAGNOSIS — E113393 Type 2 diabetes mellitus with moderate nonproliferative diabetic retinopathy without macular edema, bilateral: Secondary | ICD-10-CM

## 2020-04-18 NOTE — Assessment & Plan Note (Signed)
No overall change in plan, good blood sugar control is critical to slow the progress of diabetic retinopathy

## 2020-04-18 NOTE — Patient Instructions (Signed)
1 7 visual acuity declines or distortions

## 2020-04-18 NOTE — Progress Notes (Signed)
04/18/2020     CHIEF COMPLAINT Patient presents for Retina Follow Up   HISTORY OF PRESENT ILLNESS: Kelsey Pierce is a 74 y.o. female who presents to the clinic today for:   HPI    Retina Follow Up    Patient presents with  PVD.  In left eye.  Severity is moderate.  Duration of 5 months.  Since onset it is stable.  I, the attending physician,  performed the HPI with the patient and updated documentation appropriately.          Comments    5 Month f\u OU. FP  Pt states vision is about the same. Pt states she sees a "fuzzy" floater in outside corner of OS.       Last edited by Tilda Franco on 04/18/2020 10:53 AM. (History)      Referring physician: Burnard Bunting, MD [place],  [place] [address]  HISTORICAL INFORMATION:   Selected notes from the MEDICAL RECORD NUMBER       CURRENT MEDICATIONS: Current Outpatient Medications (Ophthalmic Drugs)  Medication Sig  . latanoprost (XALATAN) 0.005 % ophthalmic solution Place 1 drop into both eyes at bedtime.   No current facility-administered medications for this visit. (Ophthalmic Drugs)   Current Outpatient Medications (Other)  Medication Sig  . ACCU-CHEK AVIVA PLUS test strip   . ALPRAZolam (XANAX) 0.25 MG tablet Take 0.25 mg by mouth 3 (three) times daily as needed for anxiety.   Marland Kitchen amLODipine (NORVASC) 10 MG tablet TAKE 1 TABLET BY MOUTH EVERY DAY  . aspirin EC 81 MG EC tablet Take 1 tablet (81 mg total) by mouth daily.  . Cholecalciferol (VITAMIN D) 2000 units tablet Take 2,000 Units by mouth daily.  . clobetasol ointment (TEMOVATE) 0.05 % APPLY TOPICALLY 2 (TWO) TIMES A WEEK. USE TWICE WEEKLY AS NEEDED TO CONTROL SYMPTOMS. (Patient taking differently: Apply 1 application topically 4 (four) times daily as needed (lichen sclerosus flares). )  . DULoxetine (CYMBALTA) 30 MG capsule Take 60 mg by mouth daily.   Marland Kitchen ezetimibe (ZETIA) 10 MG tablet TAKE 1 TABLET BY MOUTH EVERY DAY  . hydrochlorothiazide  (MICROZIDE) 12.5 MG capsule TAKE 1 CAPSULE BY MOUTH EVERY DAY  . HYDROcodone-acetaminophen (NORCO/VICODIN) 5-325 MG per tablet Take 1 tablet by mouth every 4 (four) hours as needed (pain).   . Insulin Human (INSULIN PUMP) SOLN Inject into the skin See admin instructions. Use with Humalog  . metoprolol tartrate (LOPRESSOR) 25 MG tablet Take 0.5 tablets (12.5 mg total) by mouth 2 (two) times daily.  . nitroGLYCERIN (NITROSTAT) 0.4 MG SL tablet Place 1 tablet (0.4 mg total) under the tongue every 5 (five) minutes x 3 doses as needed for chest pain.  . rosuvastatin (CRESTOR) 20 MG tablet Take 1 tablet (20 mg total) by mouth at bedtime.  . sodium fluoride (DENTA 5000 PLUS) 1.1 % CREA dental cream Place 1 application onto teeth at bedtime.  . traZODone (DESYREL) 50 MG tablet Take 50 mg by mouth at bedtime.    No current facility-administered medications for this visit. (Other)      REVIEW OF SYSTEMS:    ALLERGIES Allergies  Allergen Reactions  . Erythromycin Other (See Comments)    Abdominal pain  . Lisinopril Other (See Comments)    Stopped due to acute kidney injury and hyperkalemia    PAST MEDICAL HISTORY Past Medical History:  Diagnosis Date  . Adenomatous colon polyp 2014   repeat colon in 5 years  . Chest  pain    2D ECHO, 05/29/2011 -EF >55%, normal; MYOVIEW, 05/29/2011 - normal  . Depression   . Diabetes ([place])   . Fracture, foot june 2014   left  . Glaucoma   . Hyperlipidemia   . Hypertension   . Lichen sclerosus et atrophicus of the vulva 05/2014  . Neuropathy    legs  . PMB (postmenopausal bleeding)    Past Surgical History:  Procedure Laterality Date  . CARPAL TUNNEL RELEASE Bilateral   . CATARACT EXTRACTION W/ INTRAOCULAR LENS IMPLANT Bilateral 06/2015  . CESAREAN SECTION  1974  . CHOLECYSTECTOMY  1969  . CORONARY STENT INTERVENTION N/A 05/04/2018   Procedure: CORONARY STENT INTERVENTION;  Surgeon: Belva Crome, MD;  Location: [facility];  Service:  Cardiovascular;  Laterality: N/A;  . ENDOMETRIAL BIOPSY  09-30-97   Dr Ricard Dillon  . HYSTEROSCOPY  02-10-97   D&C, polyp--focal hyperplasia w/o atypia  . LEFT HEART CATH AND CORONARY ANGIOGRAPHY N/A 05/04/2018   Procedure: LEFT HEART CATH AND CORONARY ANGIOGRAPHY;  Surgeon: Belva Crome, MD;  Location: [facility];  Service: Cardiovascular;  Laterality: N/A;  . TUBAL LIGATION Bilateral   . YAG LASER APPLICATION Left 31/51/7616   Dr. Zadie Rhine  . YAG LASER APPLICATION Bilateral 0737   Dr. Gershon Crane    FAMILY HISTORY Family History  Problem Relation Age of Onset  . Cancer Mother        Lung cancer  . Stroke Father 3  . Heart disease Father   . Hypertension Father   . Cancer Brother        Liver cancer  . Cancer Maternal Grandmother        Colon cancer  . Colon cancer Maternal Grandmother 57  . Heart attack Maternal Grandfather   . Heart attack Paternal Grandmother   . Alzheimer's disease Paternal Grandfather   . Heart disease Brother 33    SOCIAL HISTORY Social History   Tobacco Use  . Smoking status: Never Smoker  . Smokeless tobacco: Never Used  Vaping Use  . Vaping Use: Never used  Substance Use Topics  . Alcohol use: No    Alcohol/week: 0.0 standard drinks  . Drug use: No         OPHTHALMIC EXAM: Base Eye Exam    Visual Acuity (Snellen - Linear)      Right Left   Dist cc 20/40 20/60   Dist ph cc NI NI   Correction: Glasses       Tonometry (Tonopen, 10:57 AM)      Right Left   Pressure 15 17       Pupils      Pupils Dark Light Shape React APD   Right PERRL 4 3 Round Brisk None   Left PERRL 4 3 Round Brisk None       Visual Fields (Counting fingers)      Left Right    Full Full       Neuro/Psych    Oriented x3: Yes   Mood/Affect: Normal       Dilation    Both eyes: 1.0% Mydriacyl, 2.5% Phenylephrine @ 10:57 AM        Slit Lamp and Fundus Exam    External Exam      Right Left   External Normal Normal       Slit Lamp  Exam      Right Left   Lids/Lashes Normal Normal   Conjunctiva/Sclera White and quiet White and  quiet   Cornea Clear Clear   Anterior Chamber Deep and quiet Deep and quiet   Iris Round and reactive Round and reactive   Lens Posterior chamber intraocular lens Posterior chamber intraocular lens   Anterior Vitreous Normal Normal       Fundus Exam      Right Left   Posterior Vitreous Posterior vitreous detachment Posterior vitreous detachment   Disc no optic disc atrophy, no neovascularization, no pallor no optic disc atrophy, no neovascularization, no pallor   C/D Ratio 0.15 0.35   Macula Epiretinal membrane,, Epiretinal membrane,,no topo distortion   Vessels Mod nonproliferative diabetic retinopathy Mod nonproliferative diabetic retinopathy   Periphery Normal Normal          IMAGING AND PROCEDURES  Imaging and Procedures for 04/18/20  Color Fundus Photography Optos - OU - Both Eyes       Right Eye Progression has been stable. Disc findings include normal observations. Macula : normal observations, microaneurysms.   Left Eye Progression has been stable. Disc findings include normal observations. Macula : microaneurysms, epiretinal membrane.   Notes Moderate nonproliferative diabetic retinopathy noted in peripheral examination                ASSESSMENT/PLAN:  Moderate nonproliferative diabetic retinopathy of both eyes (HCC) No overall change in plan, good blood sugar control is critical to slow the progress of diabetic retinopathy  Posterior vitreous detachment of left eye   The nature of posterior vitreous detachment was discussed with the patient as well as its physiology, its age prevalence, and its possible implication regarding retinal breaks and detachment.  An informational brochure was given to the patient.  All the patient's questions were answered.  The patient was asked to return if new or different flashes or floaters develops.   Patient was instructed to  contact office immediately if any changes were noticed. I explained to the patient that vitreous inside the eye is similar to jello inside a bowl. As the jello melts it can start to pull away from the bowl, similarly the vitreous throughout our lives can begin to pull away from the retina. That process is called a posterior vitreous detachment. In some cases, the vitreous can tug hard enough on the retina to form a retinal tear. I discussed with the patient the signs and symptoms of a retinal detachment.  Do not rub the eye.      ICD-10-CM   1. Posterior vitreous detachment of left eye  H43.812 Color Fundus Photography Optos - OU - Both Eyes  2. Moderate nonproliferative diabetic retinopathy of both eyes without macular edema associated with type 2 diabetes mellitus ([place])  E26.8341     1.  Interval changes OU, posterior vitreous detachment occasional floaters suppressed in left eye.  2.  Moderate nonproliferative diabetic retinopathy, still poses little to no risk of significant progression over the next year with good blood sugar control  3.  Ophthalmic Meds Ordered this visit:  No orders of the defined types were placed in this encounter.      Return in about 9 months (around 01/16/2021) for DILATE OU, OCT.  There are no Patient Instructions on file for this visit.   Explained the diagnoses, plan, and follow up with the patient and they expressed understanding.  Patient expressed understanding of the importance of proper follow up care.   Clent Demark Shaun Zuccaro M.D. Diseases & Surgery of the Retina and Vitreous Retina & Diabetic [place] 04/18/20     Abbreviations:  M myopia (nearsighted); A astigmatism; H hyperopia (farsighted); P presbyopia; Mrx spectacle prescription;  CTL contact lenses; OD right eye; OS left eye; OU both eyes  XT exotropia; ET esotropia; PEK punctate epithelial keratitis; PEE punctate epithelial erosions; DES dry eye syndrome; MGD meibomian gland dysfunction; ATs  artificial tears; PFAT's preservative free artificial tears; [place] nuclear sclerotic cataract; PSC posterior subcapsular cataract; ERM epi-retinal membrane; PVD posterior vitreous detachment; RD retinal detachment; DM diabetes mellitus; DR diabetic retinopathy; NPDR non-proliferative diabetic retinopathy; PDR proliferative diabetic retinopathy; CSME clinically significant macular edema; DME diabetic macular edema; dbh dot blot hemorrhages; CWS cotton wool spot; POAG primary open angle glaucoma; C/D cup-to-disc ratio; HVF humphrey visual field; GVF goldmann visual field; OCT optical coherence tomography; IOP intraocular pressure; BRVO Branch retinal vein occlusion; CRVO central retinal vein occlusion; CRAO central retinal artery occlusion; BRAO branch retinal artery occlusion; RT retinal tear; SB scleral buckle; PPV pars plana vitrectomy; VH Vitreous hemorrhage; PRP panretinal laser photocoagulation; IVK intravitreal kenalog; VMT vitreomacular traction; MH Macular hole;  NVD neovascularization of the disc; NVE neovascularization elsewhere; AREDS age related eye disease study; ARMD age related macular degeneration; POAG primary open angle glaucoma; EBMD epithelial/anterior basement membrane dystrophy; ACIOL anterior chamber intraocular lens; IOL intraocular lens; PCIOL posterior chamber intraocular lens; Phaco/IOL phacoemulsification with intraocular lens placement; [place] photorefractive keratectomy; LASIK laser assisted in situ keratomileusis; HTN hypertension; DM diabetes mellitus; COPD chronic obstructive pulmonary disease

## 2020-04-18 NOTE — Assessment & Plan Note (Signed)
Minor OS with no topographic distortion will observe

## 2020-04-18 NOTE — Assessment & Plan Note (Signed)

## 2020-05-03 DIAGNOSIS — E1039 Type 1 diabetes mellitus with other diabetic ophthalmic complication: Secondary | ICD-10-CM | POA: Diagnosis not present

## 2020-05-03 DIAGNOSIS — F1320 Sedative, hypnotic or anxiolytic dependence, uncomplicated: Secondary | ICD-10-CM | POA: Diagnosis not present

## 2020-05-03 DIAGNOSIS — M797 Fibromyalgia: Secondary | ICD-10-CM | POA: Diagnosis not present

## 2020-05-03 DIAGNOSIS — I10 Essential (primary) hypertension: Secondary | ICD-10-CM | POA: Diagnosis not present

## 2020-05-03 DIAGNOSIS — Z794 Long term (current) use of insulin: Secondary | ICD-10-CM | POA: Diagnosis not present

## 2020-05-03 DIAGNOSIS — Z79899 Other long term (current) drug therapy: Secondary | ICD-10-CM | POA: Diagnosis not present

## 2020-05-31 ENCOUNTER — Other Ambulatory Visit: Payer: Self-pay | Admitting: Internal Medicine

## 2020-06-12 DIAGNOSIS — E1039 Type 1 diabetes mellitus with other diabetic ophthalmic complication: Secondary | ICD-10-CM | POA: Diagnosis not present

## 2020-08-22 ENCOUNTER — Other Ambulatory Visit: Payer: Self-pay | Admitting: Internal Medicine

## 2020-08-23 DIAGNOSIS — E1039 Type 1 diabetes mellitus with other diabetic ophthalmic complication: Secondary | ICD-10-CM | POA: Diagnosis not present

## 2020-08-23 DIAGNOSIS — E782 Mixed hyperlipidemia: Secondary | ICD-10-CM | POA: Diagnosis not present

## 2020-08-25 ENCOUNTER — Other Ambulatory Visit: Payer: Self-pay | Admitting: Internal Medicine

## 2020-08-25 DIAGNOSIS — I10 Essential (primary) hypertension: Secondary | ICD-10-CM

## 2020-08-30 DIAGNOSIS — E103293 Type 1 diabetes mellitus with mild nonproliferative diabetic retinopathy without macular edema, bilateral: Secondary | ICD-10-CM | POA: Diagnosis not present

## 2020-08-30 DIAGNOSIS — R82998 Other abnormal findings in urine: Secondary | ICD-10-CM | POA: Diagnosis not present

## 2020-08-30 DIAGNOSIS — E669 Obesity, unspecified: Secondary | ICD-10-CM | POA: Diagnosis not present

## 2020-08-30 DIAGNOSIS — Z0000 Encounter for general adult medical examination without abnormal findings: Secondary | ICD-10-CM | POA: Diagnosis not present

## 2020-08-30 DIAGNOSIS — M797 Fibromyalgia: Secondary | ICD-10-CM | POA: Diagnosis not present

## 2020-08-30 DIAGNOSIS — Z794 Long term (current) use of insulin: Secondary | ICD-10-CM | POA: Diagnosis not present

## 2020-08-30 DIAGNOSIS — M40294 Other kyphosis, thoracic region: Secondary | ICD-10-CM | POA: Diagnosis not present

## 2020-08-30 DIAGNOSIS — M62830 Muscle spasm of back: Secondary | ICD-10-CM | POA: Diagnosis not present

## 2020-08-30 DIAGNOSIS — E782 Mixed hyperlipidemia: Secondary | ICD-10-CM | POA: Diagnosis not present

## 2020-08-30 DIAGNOSIS — E1049 Type 1 diabetes mellitus with other diabetic neurological complication: Secondary | ICD-10-CM | POA: Diagnosis not present

## 2020-08-30 DIAGNOSIS — K921 Melena: Secondary | ICD-10-CM | POA: Diagnosis not present

## 2020-09-15 DIAGNOSIS — E1039 Type 1 diabetes mellitus with other diabetic ophthalmic complication: Secondary | ICD-10-CM | POA: Diagnosis not present

## 2020-11-18 ENCOUNTER — Other Ambulatory Visit (INDEPENDENT_AMBULATORY_CARE_PROVIDER_SITE_OTHER): Payer: Self-pay | Admitting: Ophthalmology

## 2021-01-10 ENCOUNTER — Other Ambulatory Visit: Payer: Self-pay | Admitting: Internal Medicine

## 2021-01-16 ENCOUNTER — Other Ambulatory Visit: Payer: Self-pay

## 2021-01-16 ENCOUNTER — Ambulatory Visit (INDEPENDENT_AMBULATORY_CARE_PROVIDER_SITE_OTHER): Payer: Medicare PPO | Admitting: Ophthalmology

## 2021-01-16 ENCOUNTER — Encounter (INDEPENDENT_AMBULATORY_CARE_PROVIDER_SITE_OTHER): Payer: Self-pay | Admitting: Ophthalmology

## 2021-01-16 DIAGNOSIS — H35342 Macular cyst, hole, or pseudohole, left eye: Secondary | ICD-10-CM

## 2021-01-16 DIAGNOSIS — E113393 Type 2 diabetes mellitus with moderate nonproliferative diabetic retinopathy without macular edema, bilateral: Secondary | ICD-10-CM | POA: Diagnosis not present

## 2021-01-16 DIAGNOSIS — H35371 Puckering of macula, right eye: Secondary | ICD-10-CM

## 2021-01-16 DIAGNOSIS — H43812 Vitreous degeneration, left eye: Secondary | ICD-10-CM | POA: Diagnosis not present

## 2021-01-16 NOTE — Assessment & Plan Note (Signed)
The nature of moderate nonproliferative diabetic retinopathy was discussed with the patient as well as the need for more frequent follow up to judge for progression. Good blood glucose, blood pressure, and serum lipid control was recommended as well as avoidance of smoking and maintenance of normal weight.  Close follow up with PCP encouraged. °

## 2021-01-16 NOTE — Assessment & Plan Note (Signed)
Moderate with no impact on acuity will continue to monitor

## 2021-01-16 NOTE — Assessment & Plan Note (Signed)
History of spontaneous closure of macular hole left eye with outer retinal photoreceptor disturbance and transient foveal avascular zone cleft in the anatomy with some impact on acuity yet no history of prior surgical intervention required

## 2021-01-16 NOTE — Progress Notes (Signed)
01/16/2021     CHIEF COMPLAINT Patient presents for Retina Follow Up (9 month fu OU and OCT/Pt states VA OU stable since last visit. Pt denies FOL, floaters, or ocular pain OU. Karie Mainland: 6.3/LBS: 139/Pt reports using Latanoprost QHS OU)   HISTORY OF PRESENT ILLNESS: Kelsey Pierce is a 75 y.o. female who presents to the clinic today for:   HPI     Retina Follow Up           Diagnosis: PVD   Laterality: both eyes   Onset: 9 months ago   Severity: mild   Duration: 9 months   Course: stable   Comments: 9 month fu OU and OCT Pt states VA OU stable since last visit. Pt denies FOL, floaters, or ocular pain OU.  A1C: 6.3 LBS: 139 Pt reports using Latanoprost QHS OU       Last edited by Kendra Opitz, COA on 01/16/2021 10:55 AM.      Referring physician: Burnard Bunting, MD [place],  [place] [address]  HISTORICAL INFORMATION:   Selected notes from the MEDICAL RECORD NUMBER       CURRENT MEDICATIONS: Current Outpatient Medications (Ophthalmic Drugs)  Medication Sig   latanoprost (XALATAN) 0.005 % ophthalmic solution INSTILL 1 DROP INTO BOTH EYES EVERY DAY   No current facility-administered medications for this visit. (Ophthalmic Drugs)   Current Outpatient Medications (Other)  Medication Sig   ACCU-CHEK AVIVA PLUS test strip    ALPRAZolam (XANAX) 0.25 MG tablet Take 0.25 mg by mouth 3 (three) times daily as needed for anxiety.    amLODipine (NORVASC) 10 MG tablet TAKE 1 TABLET BY MOUTH EVERY DAY   aspirin EC 81 MG EC tablet Take 1 tablet (81 mg total) by mouth daily.   Cholecalciferol (VITAMIN D) 2000 units tablet Take 2,000 Units by mouth daily.   clobetasol ointment (TEMOVATE) 0.05 % APPLY TOPICALLY 2 (TWO) TIMES A WEEK. USE TWICE WEEKLY AS NEEDED TO CONTROL SYMPTOMS. (Patient taking differently: Apply 1 application topically 4 (four) times daily as needed (lichen sclerosus flares). )   DULoxetine (CYMBALTA) 30 MG capsule Take 60 mg by mouth daily.     ezetimibe (ZETIA) 10 MG tablet TAKE 1 TABLET BY MOUTH EVERY DAY   hydrochlorothiazide (MICROZIDE) 12.5 MG capsule TAKE 1 CAPSULE BY MOUTH EVERY DAY   HYDROcodone-acetaminophen (NORCO/VICODIN) 5-325 MG per tablet Take 1 tablet by mouth every 4 (four) hours as needed (pain).    Insulin Human (INSULIN PUMP) SOLN Inject into the skin See admin instructions. Use with Humalog   metoprolol tartrate (LOPRESSOR) 25 MG tablet Take 0.5 tablets (12.5 mg total) by mouth 2 (two) times daily.   nitroGLYCERIN (NITROSTAT) 0.4 MG SL tablet PLACE 1 TABLET UNDER THE TONGUE EVERY 5 MINUTES X3 DOSES AS NEEDED FOR CHEST PAIN   rosuvastatin (CRESTOR) 20 MG tablet TAKE 1 TABLET BY MOUTH EVERYDAY AT BEDTIME   sodium fluoride (DENTA 5000 PLUS) 1.1 % CREA dental cream Place 1 application onto teeth at bedtime.   traZODone (DESYREL) 50 MG tablet Take 50 mg by mouth at bedtime.    No current facility-administered medications for this visit. (Other)      REVIEW OF SYSTEMS:    ALLERGIES Allergies  Allergen Reactions   Erythromycin Other (See Comments)    Abdominal pain   Lisinopril Other (See Comments)    Stopped due to acute kidney injury and hyperkalemia    PAST MEDICAL HISTORY Past Medical History:  Diagnosis Date  Adenomatous colon polyp 2014   repeat colon in 5 years   Chest pain    2D ECHO, 05/29/2011 -EF >55%, normal; MYOVIEW, 05/29/2011 - normal   Depression    Diabetes ([place])    Fracture, foot june 2014   left   Glaucoma    Hyperlipidemia    Hypertension    Lichen sclerosus et atrophicus of the vulva 05/2014   Neuropathy    legs   PMB (postmenopausal bleeding)    Past Surgical History:  Procedure Laterality Date   CARPAL TUNNEL RELEASE Bilateral    CATARACT EXTRACTION W/ INTRAOCULAR LENS IMPLANT Bilateral 06/2015   CESAREAN [place]   CORONARY STENT INTERVENTION N/A 05/04/2018   Procedure: CORONARY STENT INTERVENTION;  Surgeon: Belva Crome, MD;  Location:  [facility];  Service: Cardiovascular;  Laterality: N/A;   ENDOMETRIAL BIOPSY  09-30-97   Dr Jeani [place]king [place]--benign   HYSTEROSCOPY  02-10-97   D&C, polyp--focal hyperplasia w/o atypia   LEFT HEART CATH AND CORONARY ANGIOGRAPHY N/A 05/04/2018   Procedure: LEFT HEART CATH AND CORONARY ANGIOGRAPHY;  Surgeon: Belva Crome, MD;  Location: [facility];  Service: Cardiovascular;  Laterality: N/A;   TUBAL LIGATION Bilateral    YAG LASER APPLICATION Left 58/85/0277   Dr. Tawni Pummel LASER APPLICATION Bilateral 4128   Dr. Gershon Crane    FAMILY HISTORY Family History  Problem Relation Age of Onset   Cancer Mother        Lung cancer   Stroke Father 80   Heart disease Father    Hypertension Father    Cancer Brother        Liver cancer   Cancer Maternal Grandmother        Colon cancer   Colon cancer Maternal Grandmother 70   Heart attack Maternal Grandfather    Heart attack Paternal Grandmother    Alzheimer's disease Paternal Grandfather    Heart disease Brother 49    SOCIAL HISTORY Social History   Tobacco Use   Smoking status: Never   Smokeless tobacco: Never  Vaping Use   Vaping Use: Never used  Substance Use Topics   Alcohol use: No    Alcohol/week: 0.0 standard drinks   Drug use: No         OPHTHALMIC EXAM:  Base Eye Exam     Visual Acuity (ETDRS)       Right Left   Dist cc 20/50 +2 20/60   Dist ph cc 20/40 -1 NI    Correction: Glasses         Tonometry (Tonopen, 10:59 AM)       Right Left   Pressure 16 14         Pupils       Pupils Dark Light Shape React APD   Right PERRL 4 3 Round Brisk None   Left PERRL 4 3 Round Brisk None         Visual Fields       Left Right    Full    Restrictions  Partial outer superior temporal, superior nasal deficiencies         Extraocular Movement       Right Left    Full Full         Neuro/Psych     Oriented x3: Yes   Mood/Affect: Normal         Dilation     Both eyes: 1.0%  Mydriacyl,  2.5% Phenylephrine @ 10:59 AM           Slit Lamp and Fundus Exam     External Exam       Right Left   External Normal Normal         Slit Lamp Exam       Right Left   Lids/Lashes Normal Normal   Conjunctiva/Sclera White and quiet White and quiet   Cornea Clear Clear   Anterior Chamber Deep and quiet Deep and quiet   Iris Round and reactive Round and reactive   Lens Posterior chamber intraocular lens Posterior chamber intraocular lens   Anterior Vitreous Normal Normal         Fundus Exam       Right Left   Posterior Vitreous Posterior vitreous detachment Posterior vitreous detachment, prefoveal opercula   Disc no optic disc atrophy, no neovascularization, no pallor no optic disc atrophy, no neovascularization, no pallor   C/D Ratio 0.15 0.35   Macula Epiretinal membrane,, mod to severe topo distortion Epiretinal membrane,,no topo distortion, history of lamellar hole formation and full-thickness hole with spontaneously close   Vessels Mod nonproliferative diabetic retinopathy Mod nonproliferative diabetic retinopathy   Periphery Normal Normal            IMAGING AND PROCEDURES  Imaging and Procedures for 01/16/21  OCT, Retina - OU - Both Eyes       Right Eye Quality was good. Scan locations included subfoveal. Central Foveal Thickness: 326. Progression has been stable. Findings include epiretinal membrane.   Left Eye Quality was good. Scan locations included subfoveal. Central Foveal Thickness: 301. Progression has been stable. Findings include epiretinal membrane.   Notes Minor epiretinal membranes OU, stable over time will observe             ASSESSMENT/PLAN:  Lamellar macular hole, left eye History of spontaneous closure of macular hole left eye with outer retinal photoreceptor disturbance and transient foveal avascular zone cleft in the anatomy with some impact on acuity yet no history of prior surgical intervention  required  Macular pucker, right eye Moderate with no impact on acuity will continue to monitor  Moderate nonproliferative diabetic retinopathy of both eyes (HCC) The nature of moderate nonproliferative diabetic retinopathy was discussed with the patient as well as the need for more frequent follow up to judge for progression. Good blood glucose, blood pressure, and serum lipid control was recommended as well as avoidance of smoking and maintenance of normal weight.  Close follow up with PCP encouraged.      ICD-10-CM   1. Posterior vitreous detachment of left eye  H43.812     2. Moderate nonproliferative diabetic retinopathy of both eyes without macular edema associated with type 2 diabetes mellitus ([place])  V42.5956 OCT, Retina - OU - Both Eyes    3. Macular pucker, right eye  H35.371     4. Lamellar macular hole, left eye  H35.342       1.  OD, OU with moderate nonproliferative diabetic retinopathy stable, return visit in 1 year  2.  OD with moderate epiretinal membrane, slightly progressive yet with no impact on acuity will observe  3.  OS with a history of of a lamellar macular hole which led to a small spontaneous full-thickness macular hole secondary to VMT which led to spontaneous closure of the hole yet with residual visual acuity impact hole now remains close spontaneously  4.  Follow-up with Dr. Rutherford Guys for refraction  Ophthalmic Meds  Ordered this visit:  No orders of the defined types were placed in this encounter.      Return in about 1 year (around 01/16/2022) for DILATE OU, OCT.  There are no Patient Instructions on file for this visit.   Explained the diagnoses, plan, and follow up with the patient and they expressed understanding.  Patient expressed understanding of the importance of proper follow up care.   Clent Demark Brevyn Ring M.D. Diseases & Surgery of the Retina and Vitreous Retina & Diabetic [place] 01/16/21     Abbreviations: M myopia  (nearsighted); A astigmatism; H hyperopia (farsighted); P presbyopia; Mrx spectacle prescription;  CTL contact lenses; OD right eye; OS left eye; OU both eyes  XT exotropia; ET esotropia; PEK punctate epithelial keratitis; PEE punctate epithelial erosions; DES dry eye syndrome; MGD meibomian gland dysfunction; ATs artificial tears; PFAT's preservative free artificial tears; [place] nuclear sclerotic cataract; PSC posterior subcapsular cataract; ERM epi-retinal membrane; PVD posterior vitreous detachment; RD retinal detachment; DM diabetes mellitus; DR diabetic retinopathy; NPDR non-proliferative diabetic retinopathy; PDR proliferative diabetic retinopathy; CSME clinically significant macular edema; DME diabetic macular edema; dbh dot blot hemorrhages; CWS cotton wool spot; POAG primary open angle glaucoma; C/D cup-to-disc ratio; HVF humphrey visual field; GVF goldmann visual field; OCT optical coherence tomography; IOP intraocular pressure; BRVO Branch retinal vein occlusion; CRVO central retinal vein occlusion; CRAO central retinal artery occlusion; BRAO branch retinal artery occlusion; RT retinal tear; SB scleral buckle; PPV pars plana vitrectomy; VH Vitreous hemorrhage; PRP panretinal laser photocoagulation; IVK intravitreal kenalog; VMT vitreomacular traction; MH Macular hole;  NVD neovascularization of the disc; NVE neovascularization elsewhere; AREDS age related eye disease study; ARMD age related macular degeneration; POAG primary open angle glaucoma; EBMD epithelial/anterior basement membrane dystrophy; ACIOL anterior chamber intraocular lens; IOL intraocular lens; PCIOL posterior chamber intraocular lens; Phaco/IOL phacoemulsification with intraocular lens placement; [place] photorefractive keratectomy; LASIK laser assisted in situ keratomileusis; HTN hypertension; DM diabetes mellitus; COPD chronic obstructive pulmonary disease

## 2021-01-22 ENCOUNTER — Telehealth: Payer: Self-pay

## 2021-01-22 NOTE — Telephone Encounter (Signed)
Patient will need an appointment first before receiving any refills.  Thank you for helping with this.

## 2021-01-22 NOTE — Telephone Encounter (Signed)
Spoke with patient and informed her. °

## 2021-01-22 NOTE — Telephone Encounter (Signed)
Patient called requesting Clobetasol Ointment. She said she is having a flare up.  I informed her past due for her AEX with last one 07/15/2018.  She does plan to come back and I asked her could I have someone call her to schedule AEX.  She agreed and staff message sent to Abigail Butts to arrange appointment.

## 2021-01-24 DIAGNOSIS — E103293 Type 1 diabetes mellitus with mild nonproliferative diabetic retinopathy without macular edema, bilateral: Secondary | ICD-10-CM | POA: Diagnosis not present

## 2021-01-31 DIAGNOSIS — Z794 Long term (current) use of insulin: Secondary | ICD-10-CM | POA: Diagnosis not present

## 2021-01-31 DIAGNOSIS — E669 Obesity, unspecified: Secondary | ICD-10-CM | POA: Diagnosis not present

## 2021-01-31 DIAGNOSIS — G629 Polyneuropathy, unspecified: Secondary | ICD-10-CM | POA: Diagnosis not present

## 2021-01-31 DIAGNOSIS — I10 Essential (primary) hypertension: Secondary | ICD-10-CM | POA: Diagnosis not present

## 2021-01-31 DIAGNOSIS — E1039 Type 1 diabetes mellitus with other diabetic ophthalmic complication: Secondary | ICD-10-CM | POA: Diagnosis not present

## 2021-01-31 DIAGNOSIS — E1049 Type 1 diabetes mellitus with other diabetic neurological complication: Secondary | ICD-10-CM | POA: Diagnosis not present

## 2021-02-15 ENCOUNTER — Other Ambulatory Visit: Payer: Self-pay | Admitting: Internal Medicine

## 2021-02-16 ENCOUNTER — Other Ambulatory Visit: Payer: Self-pay | Admitting: Internal Medicine

## 2021-02-17 ENCOUNTER — Other Ambulatory Visit: Payer: Self-pay | Admitting: Internal Medicine

## 2021-02-24 ENCOUNTER — Other Ambulatory Visit: Payer: Self-pay | Admitting: Internal Medicine

## 2021-02-26 NOTE — Telephone Encounter (Signed)
90-day Refill for Rosuvastatin sent today 02/26/21

## 2021-03-29 DIAGNOSIS — Z1231 Encounter for screening mammogram for malignant neoplasm of breast: Secondary | ICD-10-CM | POA: Diagnosis not present

## 2021-04-10 ENCOUNTER — Other Ambulatory Visit: Payer: Self-pay | Admitting: Internal Medicine

## 2021-05-02 DIAGNOSIS — E1039 Type 1 diabetes mellitus with other diabetic ophthalmic complication: Secondary | ICD-10-CM | POA: Diagnosis not present

## 2021-05-02 DIAGNOSIS — R197 Diarrhea, unspecified: Secondary | ICD-10-CM | POA: Diagnosis not present

## 2021-05-02 DIAGNOSIS — Z794 Long term (current) use of insulin: Secondary | ICD-10-CM | POA: Diagnosis not present

## 2021-05-02 DIAGNOSIS — E1049 Type 1 diabetes mellitus with other diabetic neurological complication: Secondary | ICD-10-CM | POA: Diagnosis not present

## 2021-05-02 DIAGNOSIS — I10 Essential (primary) hypertension: Secondary | ICD-10-CM | POA: Diagnosis not present

## 2021-05-03 ENCOUNTER — Other Ambulatory Visit: Payer: Self-pay | Admitting: Internal Medicine

## 2021-05-03 ENCOUNTER — Ambulatory Visit
Admission: RE | Admit: 2021-05-03 | Discharge: 2021-05-03 | Disposition: A | Discharge status: Home / Self Care | Payer: Medicare PPO | Source: Ambulatory Visit | Attending: Internal Medicine | Admitting: Internal Medicine

## 2021-05-03 DIAGNOSIS — R7989 Other specified abnormal findings of blood chemistry: Secondary | ICD-10-CM

## 2021-05-03 DIAGNOSIS — R748 Abnormal levels of other serum enzymes: Secondary | ICD-10-CM | POA: Diagnosis not present

## 2021-05-03 DIAGNOSIS — R197 Diarrhea, unspecified: Secondary | ICD-10-CM

## 2021-05-03 DIAGNOSIS — R1030 Lower abdominal pain, unspecified: Secondary | ICD-10-CM

## 2021-05-03 DIAGNOSIS — N200 Calculus of kidney: Secondary | ICD-10-CM | POA: Diagnosis not present

## 2021-05-03 DIAGNOSIS — R630 Anorexia: Secondary | ICD-10-CM | POA: Diagnosis not present

## 2021-05-03 MED ORDER — IOPAMIDOL (ISOVUE-300) INJECTION 61%
100.0000 mL | Freq: Once | INTRAVENOUS | Status: AC | PRN
  Administered 2021-05-03: 100 mL via INTRAVENOUS

## 2021-05-04 ENCOUNTER — Ambulatory Visit (HOSPITAL_COMMUNITY)
Admission: RE | Admit: 2021-05-04 | Discharge: 2021-05-04 | Disposition: A | Discharge status: Home / Self Care | Payer: Medicare PPO | Source: Ambulatory Visit | Attending: Internal Medicine | Admitting: Internal Medicine

## 2021-05-04 ENCOUNTER — Other Ambulatory Visit (HOSPITAL_COMMUNITY): Payer: Self-pay | Admitting: Internal Medicine

## 2021-05-04 ENCOUNTER — Other Ambulatory Visit: Payer: Self-pay

## 2021-05-04 ENCOUNTER — Other Ambulatory Visit: Payer: Self-pay | Admitting: Internal Medicine

## 2021-05-04 DIAGNOSIS — R932 Abnormal findings on diagnostic imaging of liver and biliary tract: Secondary | ICD-10-CM

## 2021-05-04 DIAGNOSIS — K8689 Other specified diseases of pancreas: Secondary | ICD-10-CM | POA: Diagnosis not present

## 2021-05-04 DIAGNOSIS — R935 Abnormal findings on diagnostic imaging of other abdominal regions, including retroperitoneum: Secondary | ICD-10-CM | POA: Diagnosis not present

## 2021-05-04 DIAGNOSIS — K838 Other specified diseases of biliary tract: Secondary | ICD-10-CM | POA: Diagnosis not present

## 2021-05-04 MED ORDER — GADOBUTROL 1 MMOL/ML IV SOLN
8.5000 mL | Freq: Once | INTRAVENOUS | Status: AC | PRN
  Administered 2021-05-04: 8.5 mL via INTRAVENOUS

## 2021-05-08 ENCOUNTER — Encounter: Payer: Self-pay | Admitting: Internal Medicine

## 2021-05-09 DIAGNOSIS — K8689 Other specified diseases of pancreas: Secondary | ICD-10-CM | POA: Diagnosis not present

## 2021-05-09 DIAGNOSIS — J189 Pneumonia, unspecified organism: Secondary | ICD-10-CM | POA: Diagnosis not present

## 2021-05-09 DIAGNOSIS — R197 Diarrhea, unspecified: Secondary | ICD-10-CM | POA: Diagnosis not present

## 2021-05-09 DIAGNOSIS — E1049 Type 1 diabetes mellitus with other diabetic neurological complication: Secondary | ICD-10-CM | POA: Diagnosis not present

## 2021-05-09 DIAGNOSIS — R0981 Nasal congestion: Secondary | ICD-10-CM | POA: Diagnosis not present

## 2021-05-09 DIAGNOSIS — Z1152 Encounter for screening for COVID-19: Secondary | ICD-10-CM | POA: Diagnosis not present

## 2021-05-09 DIAGNOSIS — R5383 Other fatigue: Secondary | ICD-10-CM | POA: Diagnosis not present

## 2021-05-09 DIAGNOSIS — R5081 Fever presenting with conditions classified elsewhere: Secondary | ICD-10-CM | POA: Diagnosis not present

## 2021-05-11 ENCOUNTER — Other Ambulatory Visit: Payer: Self-pay | Admitting: Internal Medicine

## 2021-05-23 ENCOUNTER — Other Ambulatory Visit: Payer: Self-pay | Admitting: Internal Medicine

## 2021-05-31 DIAGNOSIS — E103293 Type 1 diabetes mellitus with mild nonproliferative diabetic retinopathy without macular edema, bilateral: Secondary | ICD-10-CM | POA: Diagnosis not present

## 2021-06-04 DIAGNOSIS — E103293 Type 1 diabetes mellitus with mild nonproliferative diabetic retinopathy without macular edema, bilateral: Secondary | ICD-10-CM | POA: Diagnosis not present

## 2021-06-04 DIAGNOSIS — Z23 Encounter for immunization: Secondary | ICD-10-CM | POA: Diagnosis not present

## 2021-06-04 DIAGNOSIS — E669 Obesity, unspecified: Secondary | ICD-10-CM | POA: Diagnosis not present

## 2021-06-04 DIAGNOSIS — I10 Essential (primary) hypertension: Secondary | ICD-10-CM | POA: Diagnosis not present

## 2021-06-04 DIAGNOSIS — G629 Polyneuropathy, unspecified: Secondary | ICD-10-CM | POA: Diagnosis not present

## 2021-06-04 DIAGNOSIS — Z794 Long term (current) use of insulin: Secondary | ICD-10-CM | POA: Diagnosis not present

## 2021-06-08 ENCOUNTER — Ambulatory Visit: Payer: Medicare PPO | Admitting: Internal Medicine

## 2021-06-08 ENCOUNTER — Encounter: Payer: Self-pay | Admitting: Internal Medicine

## 2021-06-08 VITALS — BP 130/60 | HR 63 | Ht 63.0 in | Wt 177.0 lb

## 2021-06-08 DIAGNOSIS — Z86010 Personal history of colonic polyps: Secondary | ICD-10-CM | POA: Diagnosis not present

## 2021-06-08 DIAGNOSIS — R935 Abnormal findings on diagnostic imaging of other abdominal regions, including retroperitoneum: Secondary | ICD-10-CM | POA: Diagnosis not present

## 2021-06-08 DIAGNOSIS — R197 Diarrhea, unspecified: Secondary | ICD-10-CM

## 2021-06-08 MED ORDER — SUTAB 1479-225-188 MG PO TABS: 1.0000 | ORAL_TABLET | Freq: Once | ORAL | 0 refills | Status: AC

## 2021-06-08 NOTE — Progress Notes (Signed)
HISTORY OF PRESENT ILLNESS:  Kelsey Pierce is a 75 y.o. female with past medical history as listed below.  She is sent today by her primary care provider regarding dilated bile duct on CT and MRI imaging.  Patient also has a history of adenomatous colon polyps and last underwent colonoscopy March 25, 2013.  Follow-up in 5 years recommended.  Patient is accompanied today by her daughter.  Her history dates back to early October when she developed an acute diarrheal illness.  Subsequently developed fevers and what was described as a flulike illness.  Testing for influenza and COVID were negative.  She was treated with antibiotics and improved.  During the course of the illness she had mild elevation of hepatic transaminases.  After recovering from her illness her transaminases have normalized.  She did undergo a CT scan of the abdomen and pelvis with contrast May 03, 2021.  Patient states that she was NOT having abdominal pain.  She denies ever having had abdominal pain.  Confirmed by her daughter.  They point this out, as this is in contrast to information on her imaging studies.  In any event, the CT revealed a diffusely dilated biliary tree and pancreatic duct with pancreatic atrophy.  There was said to be abrupt termination of the CBD at the level of the ampulla.  MRCP recommended.  This was performed May 04, 2021.  She was said to have dilated intra and extrahepatic bile ducts with no evidence of obstructive calculi or mass.  Again, pancreatic atrophy with mild pancreatic ductal dilation noted.  She is sent today.  Patient tells me she is feeling well.  Back to her baseline.  She does have diarrhea several times per week which is worse when she is anxious.  Occasional bloating and gas.  She has had no weight loss.  No stearrhea.  No family history of hepatobiliary cancer.  GI review of systems is otherwise negative.  Blood work from June 04, 2021 shows normal comprehensive metabolic panel.   Normal liver tests.  Normal CBC with hemoglobin 15.1.  Hemoglobin A1c 7.0.  REVIEW OF SYSTEMS:  All non-GI ROS negative unless otherwise stated in the HPI except for depression, anxiety, heart murmur  Past Medical History:  Diagnosis Date   Adenomatous colon polyp 2014   repeat colon in 5 years   Chest pain    2D ECHO, 05/29/2011 -EF >55%, normal; MYOVIEW, 05/29/2011 - normal   Depression    Diabetes ([place])    Fracture, foot june 2014   left   Glaucoma    Hyperlipidemia    Hypertension    Lichen sclerosus et atrophicus of the vulva 05/2014   Neuropathy    legs   PMB (postmenopausal bleeding)     Past Surgical History:  Procedure Laterality Date   CARPAL TUNNEL RELEASE Bilateral    CATARACT EXTRACTION W/ INTRAOCULAR LENS IMPLANT Bilateral 06/2015   CESAREAN [place]   CORONARY STENT INTERVENTION N/A 05/04/2018   Procedure: CORONARY STENT INTERVENTION;  Surgeon: Belva Crome, MD;  Location: [place] [facility];  Service: Cardiovascular;  Laterality: N/A;   ENDOMETRIAL BIOPSY  09-30-97   Dr Jeani [place]king [place]--benign   HYSTEROSCOPY  02-10-97   D&C, polyp--focal hyperplasia w/o atypia   LEFT HEART CATH AND CORONARY ANGIOGRAPHY N/A 05/04/2018   Procedure: LEFT HEART CATH AND CORONARY ANGIOGRAPHY;  Surgeon: Belva Crome, MD;  Location: [place] [facility];  Service: Cardiovascular;  Laterality: N/A;  TUBAL LIGATION Bilateral    YAG LASER APPLICATION Left 76/19/5093   Dr. Tawni Pummel LASER APPLICATION Bilateral 2671   Dr. Gershon Crane    Social History [name]  reports that she has never smoked. She has never used smokeless tobacco. She reports that she does not drink alcohol and does not use drugs.  family history includes Alzheimer's disease in her paternal grandfather; Cancer in her brother, maternal grandmother, and mother; Colon cancer (age of onset: 41) in her maternal grandmother; Heart attack in her maternal grandfather and paternal  grandmother; Heart disease in her father; Heart disease (age of onset: 3) in her brother; Hypertension in her father; Stroke (age of onset: 55) in her father.  Allergies  Allergen Reactions   Erythromycin Other (See Comments)    Abdominal pain   Lisinopril Other (See Comments)    Stopped due to acute kidney injury and hyperkalemia       PHYSICAL EXAMINATION: Vital signs: BP 130/60   Pulse 63   Ht 5\' 3"  (1.6 m)   Wt 177 lb (80.3 kg)   LMP 08/06/1999 (Approximate)   BMI 31.35 kg/m   Constitutional: generally well-appearing, no acute distress Psychiatric: alert and oriented x3, cooperative Eyes: extraocular movements intact, anicteric, conjunctiva pink Mouth: oral pharynx moist, no lesions Neck: supple no lymphadenopathy Cardiovascular: heart regular rate and rhythm. Lungs: clear to auscultation bilaterally Abdomen: soft, nontender, nondistended, no obvious ascites, no peritoneal signs, normal bowel sounds, no organomegaly Rectal: Omitted Extremities: no clubbing, cyanosis, or lower extremity edema bilaterally Skin: no lesions on visible extremities Neuro: No focal deficits. No asterixis.     ASSESSMENT:  1.  Diffuse biliary dilation on CT and MRI as described.  No discrete mass or ductal abnormality.  Mild pancreatic ductal dilation with pancreatic atrophy.  Normal liver test.  No pain.  Rule out subtle pancreatic or ampullary lesion. 2.  History of adenomatous colon polyps.  Last examination August 2014.  Due for surveillance 3.  Intermittent problems with diarrhea.  Possibly functional. 4.  Recent acute infectious illness.  Resolved.  Transient mild liver test abnormalities related to the same.   PLAN:  1.  Confer with EUS colleagues regarding EUS to further evaluate the biliary ductal dilation.  I described to the patient and her daughter the nature of EUS as well as its risks benefits and alternatives.  I discussed with them that if the EUS is unremarkable, nothing  further needs to be done.  I discussed with them that biopsy could be performed at that time, if necessary.  I also discussed that, if warranted, they could proceed with ERCP with associated diagnostics and/or therapeutics.  The nature of ERCP as well as its risks benefits and alternatives were also reviewed. 2.  Schedule surveillance colonoscopy.  Also biopsies to evaluate chronic intermittent diarrhea.The nature of the procedure, as well as the risks, benefits, and alternatives were carefully and thoroughly reviewed with the patient. Ample time for discussion and questions allowed. The patient understood, was satisfied, and agreed to proceed.   A total time of 60 minutes was spent preparing to see the patient, reviewing multiple laboratories and multiple x-ray studies.  Reviewing outside history.  Obtaining comprehensive history personally.  Performing comprehensive physical examination.  Counseling the patient and her daughter regarding her above listed issues.  Ordering endoscopic procedures.  Conferring with advanced endoscopy colleagues.  And, documenting clinical information in the health record

## 2021-06-08 NOTE — Patient Instructions (Signed)
If you are age 77 or older, your body mass index should be between 23-30. Your Body mass index is 31.35 kg/m. If this is out of the aforementioned range listed, please consider follow up with your Primary Care Provider.  If you are age 24 or younger, your body mass index should be between 19-25. Your Body mass index is 31.35 kg/m. If this is out of the aformentioned range listed, please consider follow up with your Primary Care Provider.   ________________________________________________________  The [facility] providers would like to encourage you to use [facility] to communicate with providers for non-urgent requests or questions.  Due to long hold times on the telephone, sending your provider a message by [facility] may be a faster and more efficient way to get a response.  Please allow 48 business hours for a response.  Please remember that this is for non-urgent requests.  _______________________________________________________  Dennis Bast have been scheduled for a colonoscopy. Please follow written instructions given to you at your visit today.  Please pick up your prep supplies at the pharmacy within the next 1-3 days. If you use inhalers (even only as needed), please bring them with you on the day of your procedure.

## 2021-06-09 NOTE — Progress Notes (Signed)
John, I agree with your plan.  We'll get her on for good side view of the ampulla and EUS.  Seems unlikely she'll need ERCP given normal LFTs and so we'll book as EUS only for now.  If it is needed an ERCP could be done at a separate time.  Patty, She needs upper EUS with either Gabe or myself, first available. Dx dilated bile ducts.    Gabe fYI  Thanks all

## 2021-06-11 ENCOUNTER — Telehealth: Payer: Self-pay

## 2021-06-11 NOTE — Telephone Encounter (Signed)
-----   Message from Irving Copas., MD sent at 06/11/2021  7:44 AM EST ----- Agree. GM ----- Message ----- From: Irene Shipper, MD Sent: 06/09/2021   2:20 PM EST To: Milus Banister, MD, Timothy Lasso, RN, #  Dan, Thanks for taking the time to review and arranging the EUS. John  ----- Message ----- From: Milus Banister, MD Sent: 06/09/2021   7:12 AM EDT To: Irene Shipper, MD, Timothy Lasso, RN, #     ----- Message ----- From: Irene Shipper, MD Sent: 06/08/2021   3:46 PM EDT To: Milus Banister, MD, #  [place],  75 year old female incidentally found to have marked biliary dilation on CT and MRCP.  Pancreatic atrophy and mild pancreatic ductal dilation.  No cause (obstructive lesion) identified.  Her liver tests are normal.  See my note.  I discussed with the patient the next best step would be diagnostic EUS.  This could be followed by ERCP if warranted.  Please review.  As always, I appreciate your expertise and assistance.  Jenny Reichmann

## 2021-06-11 NOTE — Telephone Encounter (Signed)
Left message on machine to call back      Jenny Reichmann, I agree with your plan.  We'll get her on for good side view of the ampulla and EUS.  Seems unlikely she'll need ERCP given normal LFTs and so we'll book as EUS only for now.  If it is needed an ERCP could be done at a separate time.   Aris Moman, She needs upper EUS with either Gabe or myself, first available. Dx dilated bile ducts.     Gabe fYI   Thanks all

## 2021-06-12 ENCOUNTER — Other Ambulatory Visit: Payer: Self-pay

## 2021-06-12 DIAGNOSIS — K838 Other specified diseases of biliary tract: Secondary | ICD-10-CM

## 2021-06-12 NOTE — Telephone Encounter (Signed)
EUS scheduled for 12/22 at 930 am at [facility] with DJ Sent a letter to PCP in regards to insulin pump.  Left message on machine to call back

## 2021-06-13 NOTE — Telephone Encounter (Signed)
Left message on machine to call back  

## 2021-06-14 NOTE — Telephone Encounter (Signed)
EUS scheduled, pt instructed and medications reviewed.  Patient instructions mailed to home.  Patient to call with any questions or concerns..  She has been advised that she will be contacted regarding her insulin pump.  She is aware to call our office and/or her PCP if she has not heard from anyone in a few days.

## 2021-06-20 ENCOUNTER — Other Ambulatory Visit: Payer: Self-pay | Admitting: Internal Medicine

## 2021-07-26 ENCOUNTER — Ambulatory Visit (HOSPITAL_COMMUNITY)
Admission: RE | Admit: 2021-07-26 | Discharge: 2021-07-26 | Disposition: A | Discharge status: Home / Self Care | Payer: Medicare PPO | Source: Ambulatory Visit | Attending: Gastroenterology | Admitting: Gastroenterology

## 2021-07-26 ENCOUNTER — Other Ambulatory Visit: Payer: Self-pay

## 2021-07-26 ENCOUNTER — Encounter (HOSPITAL_COMMUNITY)
Admission: RE | Disposition: A | Discharge status: Home / Self Care | Payer: Self-pay | Source: Ambulatory Visit | Attending: Gastroenterology

## 2021-07-26 ENCOUNTER — Ambulatory Visit (HOSPITAL_COMMUNITY): Payer: Medicare PPO | Admitting: Certified Registered"

## 2021-07-26 ENCOUNTER — Encounter (HOSPITAL_COMMUNITY): Payer: Self-pay | Admitting: Gastroenterology

## 2021-07-26 DIAGNOSIS — I10 Essential (primary) hypertension: Secondary | ICD-10-CM | POA: Insufficient documentation

## 2021-07-26 DIAGNOSIS — K8689 Other specified diseases of pancreas: Secondary | ICD-10-CM | POA: Insufficient documentation

## 2021-07-26 DIAGNOSIS — E785 Hyperlipidemia, unspecified: Secondary | ICD-10-CM | POA: Diagnosis not present

## 2021-07-26 DIAGNOSIS — Z9049 Acquired absence of other specified parts of digestive tract: Secondary | ICD-10-CM | POA: Diagnosis not present

## 2021-07-26 DIAGNOSIS — Z955 Presence of coronary angioplasty implant and graft: Secondary | ICD-10-CM | POA: Diagnosis not present

## 2021-07-26 DIAGNOSIS — Z794 Long term (current) use of insulin: Secondary | ICD-10-CM | POA: Insufficient documentation

## 2021-07-26 DIAGNOSIS — E119 Type 2 diabetes mellitus without complications: Secondary | ICD-10-CM | POA: Diagnosis not present

## 2021-07-26 DIAGNOSIS — I214 Non-ST elevation (NSTEMI) myocardial infarction: Secondary | ICD-10-CM | POA: Diagnosis not present

## 2021-07-26 DIAGNOSIS — I252 Old myocardial infarction: Secondary | ICD-10-CM | POA: Insufficient documentation

## 2021-07-26 DIAGNOSIS — K838 Other specified diseases of biliary tract: Secondary | ICD-10-CM | POA: Insufficient documentation

## 2021-07-26 DIAGNOSIS — I2510 Atherosclerotic heart disease of native coronary artery without angina pectoris: Secondary | ICD-10-CM | POA: Insufficient documentation

## 2021-07-26 DIAGNOSIS — E875 Hyperkalemia: Secondary | ICD-10-CM | POA: Diagnosis not present

## 2021-07-26 HISTORY — PX: ESOPHAGOGASTRODUODENOSCOPY (EGD) WITH PROPOFOL: SHX5813

## 2021-07-26 HISTORY — PX: EUS: SHX5427

## 2021-07-26 LAB — GLUCOSE, CAPILLARY
Glucose-Capillary: 86 mg/dL (ref 70–99)
Glucose-Capillary: 81 mg/dL (ref 70–99)

## 2021-07-26 SURGERY — UPPER ENDOSCOPIC ULTRASOUND (EUS) RADIAL
Anesthesia: Monitor Anesthesia Care

## 2021-07-26 MED ORDER — SODIUM CHLORIDE 0.9 % IV SOLN: INTRAVENOUS | Status: DC

## 2021-07-26 MED ORDER — PROPOFOL 500 MG/50ML IV EMUL
INTRAVENOUS | Status: DC | PRN
Start: 2021-07-26 — End: 2021-07-26
  Administered 2021-07-26: 125 ug/kg/min via INTRAVENOUS

## 2021-07-26 MED ORDER — PROPOFOL 500 MG/50ML IV EMUL
INTRAVENOUS | Status: AC
  Filled 2021-07-26: qty 50

## 2021-07-26 MED ORDER — LACTATED RINGERS IV SOLN: INTRAVENOUS | Status: DC

## 2021-07-26 MED ORDER — PROPOFOL 10 MG/ML IV BOLUS
INTRAVENOUS | Status: DC | PRN
  Administered 2021-07-26: 20 mg via INTRAVENOUS
  Administered 2021-07-26: 30 mg via INTRAVENOUS

## 2021-07-26 SURGICAL SUPPLY — 15 items

## 2021-07-26 NOTE — Anesthesia Preprocedure Evaluation (Signed)
Anesthesia Evaluation  Patient identified by MRN, date of birth, ID band Patient awake    Reviewed: Allergy & Precautions, NPO status , Patient's Chart, lab work & pertinent test results  History of Anesthesia Complications Negative for: history of anesthetic complications  Airway Mallampati: II  TM Distance: >3 FB Neck ROM: Full    Dental   Pulmonary neg pulmonary ROS,    Pulmonary exam normal        Cardiovascular hypertension, + CAD, + Past MI and + Cardiac Stents (2019)  Normal cardiovascular exam+ Valvular Problems/Murmurs MR and AS    Echo 2019: Mild hypokinesis of the inferolateral wall with overall preserved LV systolic function; mild LVH; mild MR; mild RVE; mild TR with mild pulmonary hypertension.    Neuro/Psych negative neurological ROS     GI/Hepatic negative GI ROS, Neg liver ROS,   Endo/Other  diabetes, Insulin Dependent  Renal/GU negative Renal ROS  negative genitourinary   Musculoskeletal  (+) Fibromyalgia -  Abdominal   Peds  Hematology negative hematology ROS (+)   Anesthesia Other Findings   Reproductive/Obstetrics                             Anesthesia Physical Anesthesia Plan  ASA: 3  Anesthesia Plan: MAC   Post-op Pain Management: Minimal or no pain anticipated   Induction: Intravenous  PONV Risk Score and Plan: 2 and Propofol infusion, TIVA and Treatment may vary due to age or medical condition  Airway Management Planned: Natural Airway, Nasal Cannula and Simple Face Mask  Additional Equipment: None  Intra-op Plan:   Post-operative Plan:   Informed Consent: I have reviewed the patients History and Physical, chart, labs and discussed the procedure including the risks, benefits and alternatives for the proposed anesthesia with the patient or authorized representative who has indicated his/her understanding and acceptance.       Plan Discussed with:    Anesthesia Plan Comments:         Anesthesia Quick Evaluation

## 2021-07-26 NOTE — Op Note (Signed)
[facility] Patient Name: Kelsey Pierce Procedure Date: 07/26/2021 MRN: [id number] Attending MD: Milus Banister , MD Date of Birth: 24-Sep-1945 CSN: [id number] Age: 75 Admit Type: Outpatient Procedure:                Upper EUS Indications:              Diarrheal illness 3-4 months ago, transiently                            elevated LFTs, imaging (CT and MR) suggested                            dilated biliary tree. Providers:                Milus Banister, MD, Dulcy Fanny, Cherylynn Ridges, Technician, [place]. Zenia Resides CRNA, CRNA Referring MD:             Scarlette Shorts, MD Medicines:                Monitored Anesthesia Care Complications:            No immediate complications. Estimated blood loss:                            None. Estimated Blood Loss:     Estimated blood loss: none. Procedure:                Pre-Anesthesia Assessment:                           - Prior to the procedure, a History and Physical                            was performed, and patient medications and                            allergies were reviewed. The patient's tolerance of                            previous anesthesia was also reviewed. The risks                            and benefits of the procedure and the sedation                            options and risks were discussed with the patient.                            All questions were answered, and informed consent                            was obtained. Prior Anticoagulants: The patient has  taken no previous anticoagulant or antiplatelet                            agents. ASA Grade Assessment: II - A patient with                            mild systemic disease. After reviewing the risks                            and benefits, the patient was deemed in                            satisfactory condition to undergo the procedure.                           After obtaining  informed consent, the endoscope was                            passed under direct vision. Throughout the                            procedure, the patient's blood pressure, pulse, and                            oxygen saturations were monitored continuously. The                            [id number]-[id number] ([id number]) Olympus duodenoscope was                            introduced through the mouth, and advanced to the                            second part of duodenum. The [id number]-[id number] ([id number])                            Olympus radial ultrasound scope was introduced                            through the mouth, and advanced to the second part                            of duodenum. The upper EUS was accomplished without                            difficulty. The patient tolerated the procedure                            well. Scope In: Scope Out: Findings:      ENDOSCOPIC FINDING (with duodenoscope and radial EUS scope) :      The examined esophagus was endoscopically normal.      The entire examined stomach was endoscopically normal.      The examined duodenum was endoscopically normal including very good  views of the major papilla with side viewing duodenoscope.      ENDOSONOGRAPHIC FINDING: :      1. CBD was slightly, diffusely dilated (max diameter 22mm) without stones       or soft tissue lesions.      2. Main pancreatic duct was normal throughout (max diameter 4mm in body,       tail).      3. Pancreatic parenchyma was somewhat atrophic. No discrete masses and       no signs of chronic pancreatitis.      4. No peripancreatic, periportal adenopathy.      5. Gallbladder surgically absent.      6. Limited views of liver, spleen, portal and splenic vessels were all       normal. Impression:               - Normal major papilla.                           - Slightly dilated bile duct (CBD max 47mm) without                            evident stones, masses. This is probably a                             psysiologic response to remote gallbladder removal. Moderate Sedation:      Not Applicable - Patient had care per Anesthesia. Recommendation:           - Discharge patient to home.                           - Follow clinically. Procedure Code(s):        --- Professional ---                           [phone], Esophagogastroduodenoscopy, flexible,                            transoral; with endoscopic ultrasound examination                            limited to the esophagus, stomach or duodenum, and                            adjacent structures Diagnosis Code(s):        --- Professional ---                           K83.8, Other specified diseases of biliary tract CPT copyright 2019 American Medical Association. All rights reserved. The codes documented in this report are preliminary and upon coder review may  be revised to meet current compliance requirements. Milus Banister, MD 07/26/2021 10:07:23 AM This report has been signed electronically. Number of Addenda: 0

## 2021-07-26 NOTE — Anesthesia Postprocedure Evaluation (Signed)
Anesthesia Post Note  Patient: Kelsey Pierce  Procedure(s) Performed: UPPER ENDOSCOPIC ULTRASOUND (EUS) RADIAL ESOPHAGOGASTRODUODENOSCOPY (EGD) WITH PROPOFOL     Patient location during evaluation: Endoscopy Anesthesia Type: MAC Level of consciousness: awake and alert Pain management: pain level controlled Vital Signs Assessment: post-procedure vital signs reviewed and stable Respiratory status: spontaneous breathing, nonlabored ventilation and respiratory function stable Cardiovascular status: blood pressure returned to baseline and stable Postop Assessment: no apparent nausea or vomiting Anesthetic complications: no   No notable events documented.  Last Vitals:  Vitals:   07/26/21 1006 07/26/21 1010  BP: (!) 120/43 (!) 123/47  Pulse: (!) 57 (!) 58  Resp: 16 (!) 21  Temp:    SpO2: 92% 91%    Last Pain:  Vitals:   07/26/21 0950  TempSrc: Oral  PainSc:                  Lidia Collum

## 2021-07-26 NOTE — Transfer of Care (Signed)
Immediate Anesthesia Transfer of Care Note  Patient: Kelsey Pierce  Procedure(s) Performed: UPPER ENDOSCOPIC ULTRASOUND (EUS) RADIAL ESOPHAGOGASTRODUODENOSCOPY (EGD) WITH PROPOFOL  Patient Location: PACU and Endoscopy Unit  Anesthesia Type:MAC  Level of Consciousness: awake, alert  and oriented  Airway & Oxygen Therapy: Patient Spontanous Breathing  Post-op Assessment: Report given to RN, Post -op Vital signs reviewed and stable and BP 106/44  Post vital signs: Reviewed and stable  Last Vitals:  Vitals Value Taken Time  BP    Temp    Pulse 54 07/26/21 0959  Resp 19 07/26/21 0959  SpO2 100 % 07/26/21 0959  Vitals shown include unvalidated device data.  Last Pain:  Vitals:   07/26/21 0950  TempSrc: Oral  PainSc:          Complications: No notable events documented.

## 2021-07-26 NOTE — H&P (Signed)
HPI: This is a woman referred by Dr. Henrene Pastor  Diarrheal illness and transiently elevated LFTs 04/2021. Never abd pain. Symptoms resolved. Imaging (CT and MR) suggested dilated biliary tree, mildly dilated main PD.  No obvious masses. Normal GB.   ROS: complete GI ROS as described in HPI, all other review negative.  Constitutional:  No unintentional weight loss   Past Medical History:  Diagnosis Date   Adenomatous colon polyp 2014   repeat colon in 5 years   Chest pain    2D ECHO, 05/29/2011 -EF >55%, normal; MYOVIEW, 05/29/2011 - normal   Depression    Diabetes ([place])    Fracture, foot june 2014   left   Glaucoma    Hyperlipidemia    Hypertension    Lichen sclerosus et atrophicus of the vulva 05/2014   Neuropathy    legs   PMB (postmenopausal bleeding)     Past Surgical History:  Procedure Laterality Date   CARPAL TUNNEL RELEASE Bilateral    CATARACT EXTRACTION W/ INTRAOCULAR LENS IMPLANT Bilateral 06/2015   CESAREAN [place]   CORONARY STENT INTERVENTION N/A 05/04/2018   Procedure: CORONARY STENT INTERVENTION;  Surgeon: Belva Crome, MD;  Location: [place] [facility];  Service: Cardiovascular;  Laterality: N/A;   ENDOMETRIAL BIOPSY  09-30-97   Dr Jeani [place]king [place]--benign   HYSTEROSCOPY  02-10-97   D&C, polyp--focal hyperplasia w/o atypia   LEFT HEART CATH AND CORONARY ANGIOGRAPHY N/A 05/04/2018   Procedure: LEFT HEART CATH AND CORONARY ANGIOGRAPHY;  Surgeon: Belva Crome, MD;  Location: [place] [facility];  Service: Cardiovascular;  Laterality: N/A;   TUBAL LIGATION Bilateral    YAG LASER APPLICATION Left 43/15/4008   Dr. Zadie Rhine   YAG LASER APPLICATION Bilateral 6761   Dr. Gershon Crane    No current facility-administered medications for this encounter.    Allergies as of 06/12/2021 - Review Complete 06/08/2021  Allergen Reaction Noted   Erythromycin Other (See Comments) 01/05/2013   Lisinopril Other (See Comments) 07/04/2016    Family  History  Problem Relation Age of Onset   Cancer Mother        Lung cancer   Stroke Father 70   Heart disease Father    Hypertension Father    Cancer Brother        Liver cancer   Cancer Maternal Grandmother        Colon cancer   Colon cancer Maternal Grandmother 17   Heart attack Maternal Grandfather    Heart attack Paternal Grandmother    Alzheimer's disease Paternal Grandfather    Heart disease Brother 87    Social History   Socioeconomic History   Marital status: Widowed    Spouse name: Not on file   Number of children: 2   Years of education: Not on file   Highest education level: Not on file  Occupational History   Not on file  Tobacco Use   Smoking status: Never   Smokeless tobacco: Never  Vaping Use   Vaping Use: Never used  Substance and Sexual Activity   Alcohol use: No    Alcohol/week: 0.0 standard drinks   Drug use: No   Sexual activity: Not Currently    Partners: Male    Birth control/protection: Post-menopausal  Other Topics Concern   Not on file  Social History Narrative   Not on file   Social Determinants of Health   Financial Resource Strain: Not on file  Food Insecurity: Not on  file  Transportation Needs: Not on file  Physical Activity: Not on file  Stress: Not on file  Social Connections: Not on file  Intimate Partner Violence: Not on file     Physical Exam: LMP 08/06/1999 (Approximate)  Constitutional: generally well-appearing Psychiatric: alert and oriented x3 Abdomen: soft, nontender, nondistended, no obvious ascites, no peritoneal signs, normal bowel sounds No peripheral edema noted in lower extremities  Assessment and plan: 75 y.o. female with abnormal biliary tree, currently normal LFTs  For EUS evaluation today  Please see the "Patient Instructions" section for addition details about the plan.  Owens Loffler, MD [facility] 07/26/2021, 8:30 AM

## 2021-07-26 NOTE — Anesthesia Procedure Notes (Signed)
Procedure Name: MAC Date/Time: 07/26/2021 9:26 AM Performed by: Lollie Sails, CRNA Pre-anesthesia Checklist: Patient identified, Emergency Drugs available, Suction available, Patient being monitored and Timeout performed Oxygen Delivery Method: Simple face mask Preoxygenation: POM used. Placement Confirmation: positive ETCO2

## 2021-07-27 ENCOUNTER — Encounter (HOSPITAL_COMMUNITY): Payer: Self-pay | Admitting: Gastroenterology

## 2021-08-14 ENCOUNTER — Ambulatory Visit (AMBULATORY_SURGERY_CENTER): Payer: Medicare PPO | Admitting: Internal Medicine

## 2021-08-14 ENCOUNTER — Encounter: Payer: Self-pay | Admitting: Internal Medicine

## 2021-08-14 ENCOUNTER — Other Ambulatory Visit: Payer: Self-pay

## 2021-08-14 VITALS — BP 133/92 | HR 68 | Temp 98.4°F | Resp 14 | Ht 63.0 in | Wt 177.0 lb

## 2021-08-14 DIAGNOSIS — Z86010 Personal history of colonic polyps: Secondary | ICD-10-CM

## 2021-08-14 DIAGNOSIS — D125 Benign neoplasm of sigmoid colon: Secondary | ICD-10-CM

## 2021-08-14 DIAGNOSIS — K635 Polyp of colon: Secondary | ICD-10-CM

## 2021-08-14 DIAGNOSIS — E119 Type 2 diabetes mellitus without complications: Secondary | ICD-10-CM | POA: Diagnosis not present

## 2021-08-14 DIAGNOSIS — I2510 Atherosclerotic heart disease of native coronary artery without angina pectoris: Secondary | ICD-10-CM | POA: Diagnosis not present

## 2021-08-14 DIAGNOSIS — I10 Essential (primary) hypertension: Secondary | ICD-10-CM | POA: Diagnosis not present

## 2021-08-14 MED ORDER — SODIUM CHLORIDE 0.9 % IV SOLN: 500.0000 mL | Freq: Once | INTRAVENOUS | Status: DC

## 2021-08-14 NOTE — Progress Notes (Signed)
Called to room to assist during endoscopic procedure.  Patient ID and intended procedure confirmed with present staff. Received instructions for my participation in the procedure from the performing physician.  

## 2021-08-14 NOTE — Op Note (Signed)
[facility] Patient Name: Kelsey Pierce Procedure Date: 08/14/2021 9:12 AM MRN: [id number] Endoscopist: Docia Chuck. Henrene Pastor , MD Age: 76 Referring MD:  Date of Birth: [birth year] Gender: Female Account #: [id number] Procedure:                Colonoscopy with cold snare polypectomy x 1 Indications:              High risk colon cancer surveillance: Personal                            history of non-advanced adenoma. Previous                            examinations 2006, 2014 Medicines:                Monitored Anesthesia Care Procedure:                Pre-Anesthesia Assessment:                           - Prior to the procedure, a History and Physical                            was performed, and patient medications and                            allergies were reviewed. The patient's tolerance of                            previous anesthesia was also reviewed. The risks                            and benefits of the procedure and the sedation                            options and risks were discussed with the patient.                            All questions were answered, and informed consent                            was obtained. Prior Anticoagulants: The patient has                            taken no previous anticoagulant or antiplatelet                            agents. ASA Grade Assessment: III - A patient with                            severe systemic disease. After reviewing the risks                            and benefits, the patient was deemed in  satisfactory condition to undergo the procedure.                           After obtaining informed consent, the colonoscope                            was passed under direct vision. Throughout the                            procedure, the patient's blood pressure, pulse, and                            oxygen saturations were monitored continuously. The                            Olympus  CF-HQ190L ([id number]) Colonoscope was                            introduced through the anus and advanced to the the                            cecum, identified by appendiceal orifice and                            ileocecal valve. The ileocecal valve, appendiceal                            orifice, and rectum were photographed. The quality                            of the bowel preparation was excellent. The                            colonoscopy was performed without difficulty. The                            patient tolerated the procedure well. The bowel                            preparation used was SUPREP via split dose                            instruction. Scope In: 10:04:11 AM Scope Out: 10:15:52 AM Scope Withdrawal Time: 0 hours 8 minutes 23 seconds  Total Procedure Duration: 0 hours 11 minutes 41 seconds  Findings:                 A 3 mm polyp was found in the sigmoid colon. The                            polyp was removed with a cold snare. Resection and                            retrieval were complete.  Multiple diverticula were found in the sigmoid                            colon.                           The exam was otherwise without abnormality on                            direct and retroflexion views. Complications:            No immediate complications. Estimated blood loss:                            None. Estimated Blood Loss:     Estimated blood loss: none. Impression:               - One 3 mm polyp in the sigmoid colon, removed with                            a cold snare. Resected and retrieved.                           - Diverticulosis in the sigmoid colon.                           - The examination was otherwise normal on direct                            and retroflexion views. Recommendation:           - Repeat colonoscopy is not recommended for                            surveillance.                           - Patient  has a contact number available for                            emergencies. The signs and symptoms of potential                            delayed complications were discussed with the                            patient. Return to normal activities tomorrow.                            Written discharge instructions were provided to the                            patient.                           - Resume previous diet.                           -  Continue present medications.                           - Await pathology results. Docia Chuck. Henrene Pastor, MD 08/14/2021 10:21:01 AM This report has been signed electronically.

## 2021-08-14 NOTE — Progress Notes (Signed)
Report to PACU, RN, vss, BBS= Clear.  

## 2021-08-14 NOTE — Patient Instructions (Signed)
Read all of the handouts given to you by your recover room nurse.  YOU HAD AN ENDOSCOPIC PROCEDURE TODAY AT [facility]:   Refer to the procedure report that was given to you for any specific questions about what was found during the examination.  If the procedure report does not answer your questions, please call your gastroenterologist to clarify.  If you requested that your care partner not be given the details of your procedure findings, then the procedure report has been included in a sealed envelope for you to review at your convenience later.  YOU SHOULD EXPECT: Some feelings of bloating in the abdomen. Passage of more gas than usual.  Walking can help get rid of the air that was put into your GI tract during the procedure and reduce the bloating. If you had a lower endoscopy (such as a colonoscopy or flexible sigmoidoscopy) you may notice spotting of blood in your stool or on the toilet paper. If you underwent a bowel prep for your procedure, you may not have a normal bowel movement for a few days.  Please Note:  You might notice some irritation and congestion in your nose or some drainage.  This is from the oxygen used during your procedure.  There is no need for concern and it should clear up in a day or so.  SYMPTOMS TO REPORT IMMEDIATELY:  Following lower endoscopy (colonoscopy or flexible sigmoidoscopy):  Excessive amounts of blood in the stool  Significant tenderness or worsening of abdominal pains  Swelling of the abdomen that is new, acute  Fever of 100F or higher    For urgent or emergent issues, a gastroenterologist can be reached at any hour by calling [phone]. Do not use MyChart messaging for urgent concerns.    DIET:  We do recommend a small meal at first, but then you may proceed to your regular diet.  Drink plenty of fluids but you should avoid alcoholic beverages for 24 hours. Try to eat a high fiber diet, and drink a lot of water.  ACTIVITY:   You should plan to take it easy for the rest of today and you should NOT DRIVE or use heavy machinery until tomorrow (because of the sedation medicines used during the test).    FOLLOW UP: Our staff will call the number listed on your records 48-72 hours following your procedure to check on you and address any questions or concerns that you may have regarding the information given to you following your procedure. If we do not reach you, we will leave a message.  We will attempt to reach you two times.  During this call, we will ask if you have developed any symptoms of COVID 19. If you develop any symptoms (ie: fever, flu-like symptoms, shortness of breath, cough etc.) before then, please call [phone].  If you test positive for Covid 19 in the 2 weeks post procedure, please call and report this information to [place].    If any biopsies were taken you will be contacted by phone or by letter within the next 1-3 weeks.  Please call us at [phone] if you have not heard about the biopsies in 3 weeks.    SIGNATURES/CONFIDENTIALITY: You and/or your care partner have signed paperwork which will be entered into your electronic medical record.  These signatures attest to the fact that that the information above on your After Visit Summary has been reviewed and is understood.  Full responsibility of the confidentiality of  this discharge information lies with you and/or your care-partner.

## 2021-08-14 NOTE — Progress Notes (Signed)
HISTORY OF PRESENT ILLNESS:  Kelsey Pierce is a 76 y.o. female presents today for surveillance colonoscopy.  She was evaluated in the office June 08, 2021.  See that dictation.  No interval change in her history or physical exam.  She did undergo EUS to evaluate dilated bile duct.  No significant abnormalities noted  REVIEW OF SYSTEMS:  All non-GI ROS negative except for  Past Medical History:  Diagnosis Date   Adenomatous colon polyp 2014   repeat colon in 5 years   Anxiety    Chest pain    2D ECHO, 05/29/2011 -EF >55%, normal; MYOVIEW, 05/29/2011 - normal   Depression    Diabetes ([place])    Fracture, foot 01/2013   left   Glaucoma    Heart murmur    Hyperlipidemia    Hypertension    Lichen sclerosus et atrophicus of the vulva 05/2014   Neuropathy    legs   PMB (postmenopausal bleeding)     Past Surgical History:  Procedure Laterality Date   CARPAL TUNNEL RELEASE Bilateral    CATARACT EXTRACTION W/ INTRAOCULAR LENS IMPLANT Bilateral 06/2015   CESAREAN SECTION  1974   CHOLECYSTECTOMY  1969   CORONARY STENT INTERVENTION N/A 05/04/2018   Procedure: CORONARY STENT INTERVENTION;  Surgeon: Belva Crome, MD;  Location: [facility];  Service: Cardiovascular;  Laterality: N/A;   ENDOMETRIAL BIOPSY  09-30-97   Dr Jeani Hawking [place]--benign   ESOPHAGOGASTRODUODENOSCOPY (EGD) WITH PROPOFOL N/A 07/26/2021   Procedure: ESOPHAGOGASTRODUODENOSCOPY (EGD) WITH PROPOFOL;  Surgeon: Milus Banister, MD;  Location: [facility];  Service: Endoscopy;  Laterality: N/A;   EUS N/A 07/26/2021   Procedure: UPPER ENDOSCOPIC ULTRASOUND (EUS) RADIAL;  Surgeon: Milus Banister, MD;  Location: [facility];  Service: Endoscopy;  Laterality: N/A;   HYSTEROSCOPY  02-10-97   D&C, polyp--focal hyperplasia w/o atypia   LEFT HEART CATH AND CORONARY ANGIOGRAPHY N/A 05/04/2018   Procedure: LEFT HEART CATH AND CORONARY ANGIOGRAPHY;  Surgeon: Belva Crome, MD;  Location: [facility];  Service:  Cardiovascular;  Laterality: N/A;   TUBAL LIGATION Bilateral    YAG LASER APPLICATION Left 83/38/2505   Dr. Tawni Pummel LASER APPLICATION Bilateral 3976   Dr. Gershon Crane    Social History [name]  reports that she has never smoked. She has never used smokeless tobacco. She reports that she does not drink alcohol and does not use drugs.  family history includes Alzheimer's disease in her paternal grandfather; Cancer in her brother, maternal grandmother, and mother; Colon cancer (age of onset: 78) in her maternal grandmother; Heart attack in her maternal grandfather and paternal grandmother; Heart disease in her father; Heart disease (age of onset: 44) in her brother; Hypertension in her father; Stroke (age of onset: 9) in her father.  Allergies  Allergen Reactions   Erythromycin Other (See Comments)    Abdominal pain   Lisinopril Other (See Comments)    Stopped due to acute kidney injury and hyperkalemia       PHYSICAL EXAMINATION:  Vital signs: BP (!) 138/100    Pulse 74    Temp 98.4 F (36.9 C)    Ht 5\' 3"  (1.6 m)    Wt 177 lb (80.3 kg)    LMP 08/06/1999 (Approximate)    SpO2 100%    BMI 31.35 kg/m  General: Well-developed, well-nourished, no acute distress HEENT: Sclerae are anicteric, conjunctiva pink. Oral mucosa intact Lungs: Clear Heart: Regular Abdomen: soft, nontender, nondistended, no obvious  ascites, no peritoneal signs, normal bowel sounds. No organomegaly. Extremities: No edema Psychiatric: alert and oriented x3. Cooperative     ASSESSMENT:  History of adenomatous colon polyps   PLAN:   Surveillance colonoscopy

## 2021-08-16 ENCOUNTER — Encounter: Payer: Self-pay | Admitting: Internal Medicine

## 2021-08-16 ENCOUNTER — Telehealth: Payer: Self-pay

## 2021-08-16 NOTE — Telephone Encounter (Signed)
\ °  Follow up Call-  Call back number 08/14/2021  Post procedure Call Back phone  # [phone]  Permission to leave phone message Yes  Some recent data might be hidden     Patient questions:  Do you have a fever, pain , or abdominal swelling? No. Pain Score  0 *  Have you tolerated food without any problems? Yes.    Have you been able to return to your normal activities? Yes.    Do you have any questions about your discharge instructions: Diet   No. Medications  No. Follow up visit  No.  Do you have questions or concerns about your Care? No.  Actions: * If pain score is 4 or above: No action needed, pain <4.  Have you developed a fever since your procedure? No   2.   Have you had an respiratory symptoms (SOB or cough) since your procedure? no  3.   Have you tested positive for COVID 19 since your procedure no  4.   Have you had any family members/close contacts diagnosed with the COVID 19 since your procedure?  no   If yes to any of these questions please route to Joylene John, RN and Joella Prince, RN

## 2021-08-27 DIAGNOSIS — E782 Mixed hyperlipidemia: Secondary | ICD-10-CM | POA: Diagnosis not present

## 2021-08-27 DIAGNOSIS — E1049 Type 1 diabetes mellitus with other diabetic neurological complication: Secondary | ICD-10-CM | POA: Diagnosis not present

## 2021-08-27 DIAGNOSIS — I10 Essential (primary) hypertension: Secondary | ICD-10-CM | POA: Diagnosis not present

## 2021-08-27 DIAGNOSIS — E039 Hypothyroidism, unspecified: Secondary | ICD-10-CM | POA: Diagnosis not present

## 2021-08-29 DIAGNOSIS — E1039 Type 1 diabetes mellitus with other diabetic ophthalmic complication: Secondary | ICD-10-CM | POA: Diagnosis not present

## 2021-08-29 DIAGNOSIS — E785 Hyperlipidemia, unspecified: Secondary | ICD-10-CM | POA: Diagnosis not present

## 2021-09-03 DIAGNOSIS — I10 Essential (primary) hypertension: Secondary | ICD-10-CM | POA: Diagnosis not present

## 2021-09-03 DIAGNOSIS — E039 Hypothyroidism, unspecified: Secondary | ICD-10-CM | POA: Diagnosis not present

## 2021-09-03 DIAGNOSIS — Z1339 Encounter for screening examination for other mental health and behavioral disorders: Secondary | ICD-10-CM | POA: Diagnosis not present

## 2021-09-03 DIAGNOSIS — Z0000 Encounter for general adult medical examination without abnormal findings: Secondary | ICD-10-CM | POA: Diagnosis not present

## 2021-09-03 DIAGNOSIS — I2510 Atherosclerotic heart disease of native coronary artery without angina pectoris: Secondary | ICD-10-CM | POA: Diagnosis not present

## 2021-09-03 DIAGNOSIS — E1039 Type 1 diabetes mellitus with other diabetic ophthalmic complication: Secondary | ICD-10-CM | POA: Diagnosis not present

## 2021-09-03 DIAGNOSIS — Z1331 Encounter for screening for depression: Secondary | ICD-10-CM | POA: Diagnosis not present

## 2021-09-03 DIAGNOSIS — R82998 Other abnormal findings in urine: Secondary | ICD-10-CM | POA: Diagnosis not present

## 2021-09-03 DIAGNOSIS — E669 Obesity, unspecified: Secondary | ICD-10-CM | POA: Diagnosis not present

## 2021-09-03 DIAGNOSIS — G629 Polyneuropathy, unspecified: Secondary | ICD-10-CM | POA: Diagnosis not present

## 2021-10-22 DIAGNOSIS — E103293 Type 1 diabetes mellitus with mild nonproliferative diabetic retinopathy without macular edema, bilateral: Secondary | ICD-10-CM | POA: Diagnosis not present

## 2021-11-11 ENCOUNTER — Other Ambulatory Visit: Payer: Self-pay | Admitting: Internal Medicine

## 2021-11-11 DIAGNOSIS — I10 Essential (primary) hypertension: Secondary | ICD-10-CM

## 2021-12-13 ENCOUNTER — Other Ambulatory Visit (INDEPENDENT_AMBULATORY_CARE_PROVIDER_SITE_OTHER): Payer: Self-pay | Admitting: Ophthalmology

## 2022-01-15 DIAGNOSIS — E785 Hyperlipidemia, unspecified: Secondary | ICD-10-CM | POA: Diagnosis not present

## 2022-01-15 DIAGNOSIS — I10 Essential (primary) hypertension: Secondary | ICD-10-CM | POA: Diagnosis not present

## 2022-01-15 DIAGNOSIS — E1039 Type 1 diabetes mellitus with other diabetic ophthalmic complication: Secondary | ICD-10-CM | POA: Diagnosis not present

## 2022-01-22 ENCOUNTER — Encounter (INDEPENDENT_AMBULATORY_CARE_PROVIDER_SITE_OTHER): Payer: Self-pay | Admitting: Ophthalmology

## 2022-01-22 ENCOUNTER — Ambulatory Visit (INDEPENDENT_AMBULATORY_CARE_PROVIDER_SITE_OTHER): Payer: Medicare PPO | Admitting: Ophthalmology

## 2022-01-22 DIAGNOSIS — H353132 Nonexudative age-related macular degeneration, bilateral, intermediate dry stage: Secondary | ICD-10-CM

## 2022-01-22 DIAGNOSIS — E113393 Type 2 diabetes mellitus with moderate nonproliferative diabetic retinopathy without macular edema, bilateral: Secondary | ICD-10-CM | POA: Diagnosis not present

## 2022-01-22 DIAGNOSIS — H35372 Puckering of macula, left eye: Secondary | ICD-10-CM

## 2022-01-22 DIAGNOSIS — H35342 Macular cyst, hole, or pseudohole, left eye: Secondary | ICD-10-CM | POA: Diagnosis not present

## 2022-01-22 DIAGNOSIS — H35371 Puckering of macula, right eye: Secondary | ICD-10-CM

## 2022-01-22 DIAGNOSIS — H43812 Vitreous degeneration, left eye: Secondary | ICD-10-CM

## 2022-01-22 NOTE — Assessment & Plan Note (Signed)

## 2022-01-22 NOTE — Patient Instructions (Signed)

## 2022-01-22 NOTE — Assessment & Plan Note (Signed)
Minor no topographic distortion in the fovea

## 2022-01-22 NOTE — Assessment & Plan Note (Signed)
Stable over time. 

## 2022-01-22 NOTE — Progress Notes (Signed)
01/22/2022     CHIEF COMPLAINT Patient presents for  Chief Complaint  Patient presents with   Retina Follow Up      HISTORY OF PRESENT ILLNESS: Kelsey Pierce is a 76 y.o. female who presents to the clinic today for:   HPI     Retina Follow Up           Laterality: left eye   Severity: moderate   Course: stable         Comments   1 YR FU OU OCT. Pt stated vision has been stable since last visit. Pt denies floaters and FOL. Pt is taking Latanoprost- 1 drop into both eyes daily.       Last edited by Silvestre Moment on 01/22/2022 11:16 AM.      Referring physician: Burnard Bunting, MD [place],  [place] [id number]  HISTORICAL INFORMATION:   Selected notes from the [facility]: Current Outpatient Medications (Ophthalmic Drugs)  Medication Sig   latanoprost (XALATAN) 0.005 % ophthalmic solution INSTILL 1 DROP INTO BOTH EYES EVERY DAY   No current facility-administered medications for this visit. (Ophthalmic Drugs)   Current Outpatient Medications (Other)  Medication Sig   ACCU-CHEK AVIVA PLUS test strip    ALPRAZolam (XANAX) 0.25 MG tablet Take 0.25 mg by mouth daily as needed for anxiety.   amLODipine (NORVASC) 10 MG tablet TAKE 1 TABLET BY MOUTH EVERY DAY   aspirin EC 81 MG EC tablet Take 1 tablet (81 mg total) by mouth daily.   Cholecalciferol (VITAMIN D) 2000 units tablet Take 2,000 Units by mouth daily.   clobetasol ointment (TEMOVATE) 0.05 % APPLY TOPICALLY 2 (TWO) TIMES A WEEK. USE TWICE WEEKLY AS NEEDED TO CONTROL SYMPTOMS. (Patient not taking: Reported on 07/20/2021)   co-enzyme Q-10 30 MG capsule Take 30 mg by mouth daily. (Patient not taking: Reported on 08/14/2021)   DULoxetine (CYMBALTA) 30 MG capsule Take 60 mg by mouth daily.    ezetimibe (ZETIA) 10 MG tablet TAKE 1 TABLET BY MOUTH EVERY DAY   Glucagon, rDNA, (GLUCAGON EMERGENCY) 1 MG KIT Use as needed for severe hypoglycemia   hydrochlorothiazide  (MICROZIDE) 12.5 MG capsule TAKE 1 CAPSULE BY MOUTH EVERY DAY (Patient not taking: Reported on 07/20/2021)   HYDROcodone-acetaminophen (NORCO/VICODIN) 5-325 MG per tablet Take 1 tablet by mouth every 4 (four) hours as needed (pain).    Insulin Human (INSULIN PUMP) SOLN Inject into the skin See admin instructions. Use with Humalog   insulin lispro (HUMALOG) 100 UNIT/ML injection Inject 5-40 Units into the skin daily. 40 max for units per day for pump   metoprolol tartrate (LOPRESSOR) 25 MG tablet Take 0.5 tablets (12.5 mg total) by mouth 2 (two) times daily.   nitroGLYCERIN (NITROSTAT) 0.4 MG SL tablet PLACE 1 TABLET UNDER THE TONGUE EVERY 5 MINUTES X3 DOSES AS NEEDED FOR CHEST PAIN (Patient not taking: Reported on 08/14/2021)   Probiotic Product ([place]) CAPS Take 1 capsule by mouth at bedtime. (Patient not taking: Reported on 08/14/2021)   rosuvastatin (CRESTOR) 20 MG tablet TAKE 1 TABLET BY MOUTH EVERYDAY AT BEDTIME (Patient taking differently: Take 40 mg by mouth at bedtime.)   sodium fluoride (PREVIDENT 5000 PLUS) 1.1 % CREA dental cream Place 1 application onto teeth at bedtime.   traZODone (DESYREL) 50 MG tablet Take 50 mg by mouth at bedtime.    No current facility-administered medications for this visit. (Other)  REVIEW OF SYSTEMS: ROS   Negative for: Constitutional, Gastrointestinal, Neurological, Skin, Genitourinary, Musculoskeletal, HENT, Endocrine, Cardiovascular, Eyes, Respiratory, Psychiatric, Allergic/Imm, Heme/Lymph Last edited by Silvestre Moment on 01/22/2022 11:16 AM.       ALLERGIES Allergies  Allergen Reactions   Erythromycin Other (See Comments)    Abdominal pain   Lisinopril Other (See Comments)    Stopped due to acute kidney injury and hyperkalemia    PAST MEDICAL HISTORY Past Medical History:  Diagnosis Date   Adenomatous colon polyp 2014   repeat colon in 5 years   Anxiety    Chest pain    2D ECHO, 05/29/2011 -EF >55%, normal; MYOVIEW,  05/29/2011 - normal   Depression    Diabetes ([place])    Fracture, foot 01/2013   left   Glaucoma    Heart murmur    Hyperlipidemia    Hypertension    Lichen sclerosus et atrophicus of the vulva 05/2014   Neuropathy    legs   PMB (postmenopausal bleeding)    Past Surgical History:  Procedure Laterality Date   CARPAL TUNNEL RELEASE Bilateral    CATARACT EXTRACTION W/ INTRAOCULAR LENS IMPLANT Bilateral 06/2015   CESAREAN SECTION  1974   CHOLECYSTECTOMY  1969   CORONARY STENT INTERVENTION N/A 05/04/2018   Procedure: CORONARY STENT INTERVENTION;  Surgeon: Belva Crome, MD;  Location: [facility];  Service: Cardiovascular;  Laterality: N/A;   ENDOMETRIAL BIOPSY  09-30-97   Dr Jeani Hawking [place]--benign   ESOPHAGOGASTRODUODENOSCOPY (EGD) WITH PROPOFOL N/A 07/26/2021   Procedure: ESOPHAGOGASTRODUODENOSCOPY (EGD) WITH PROPOFOL;  Surgeon: Milus Banister, MD;  Location: [facility];  Service: Endoscopy;  Laterality: N/A;   EUS N/A 07/26/2021   Procedure: UPPER ENDOSCOPIC ULTRASOUND (EUS) RADIAL;  Surgeon: Milus Banister, MD;  Location: [facility];  Service: Endoscopy;  Laterality: N/A;   HYSTEROSCOPY  02-10-97   D&C, polyp--focal hyperplasia w/o atypia   LEFT HEART CATH AND CORONARY ANGIOGRAPHY N/A 05/04/2018   Procedure: LEFT HEART CATH AND CORONARY ANGIOGRAPHY;  Surgeon: Belva Crome, MD;  Location: [facility];  Service: Cardiovascular;  Laterality: N/A;   TUBAL LIGATION Bilateral    YAG LASER APPLICATION Left 08/81/1031   Dr. Zadie Rhine   YAG LASER APPLICATION Bilateral 5945   Dr. Gershon Crane    FAMILY HISTORY Family History  Problem Relation Age of Onset   Cancer Mother        Lung cancer   Stroke Father 58   Heart disease Father    Hypertension Father    Cancer Brother        Liver cancer   Heart disease Brother 65   Cancer Maternal Grandmother        Colon cancer   Colon cancer Maternal Grandmother 70   Heart attack Maternal Grandfather    Heart attack Paternal  Grandmother    Alzheimer's disease Paternal Grandfather    Esophageal cancer Neg Hx    Stomach cancer Neg Hx    Rectal cancer Neg Hx     SOCIAL HISTORY Social History   Tobacco Use   Smoking status: Never   Smokeless tobacco: Never  Vaping Use   Vaping Use: Never used  Substance Use Topics   Alcohol use: No    Alcohol/week: 0.0 standard drinks of alcohol   Drug use: No         OPHTHALMIC EXAM:  Base Eye Exam     Visual Acuity (ETDRS)       Right Left   Dist  cc 20/50 20/40   Dist ph cc 20/40 -1 NI    Correction: Glasses         Tonometry (Tonopen, 11:21 AM)       Right Left   Pressure 17 19         Pupils       Pupils APD   Right PERRL None   Left PERRL None         Visual Fields       Left Right    Full    Restrictions  Partial outer superior temporal, superior nasal deficiencies         Extraocular Movement       Right Left    Full Full         Neuro/Psych     Oriented x3: Yes   Mood/Affect: Normal         Dilation     Both eyes: 1.0% Mydriacyl, 2.5% Phenylephrine @ 11:21 AM           Slit Lamp and Fundus Exam     External Exam       Right Left   External Normal Normal         Slit Lamp Exam       Right Left   Lids/Lashes Normal Normal   Conjunctiva/Sclera White and quiet White and quiet   Cornea Clear Clear   Anterior Chamber Deep and quiet Deep and quiet   Iris Round and reactive Round and reactive   Lens Posterior chamber intraocular lens Posterior chamber intraocular lens   Anterior Vitreous Normal Normal         Fundus Exam       Right Left   Posterior Vitreous Posterior vitreous detachment Posterior vitreous detachment, prefoveal opercula   Disc no optic disc atrophy, no neovascularization, no pallor no optic disc atrophy, no neovascularization, no pallor   C/D Ratio 0.15 0.35   Macula Epiretinal membrane, mod to severe topo distortion, Early age related macular degeneration, Hard drusen  Epiretinal membrane, no topo distortion, history of lamellar hole formation and full-thickness hole with spontaneously close, Early age related macular degeneration, Hard drusen   Vessels Mod nonproliferative diabetic retinopathy Mod nonproliferative diabetic retinopathy   Periphery Normal Normal            IMAGING AND PROCEDURES  Imaging and Procedures for 01/22/22  OCT, Retina - OU - Both Eyes       Right Eye Quality was good. Scan locations included subfoveal. Central Foveal Thickness: 328. Progression has been stable. Findings include abnormal foveal contour, retinal drusen , epiretinal membrane.   Left Eye Quality was good. Scan locations included subfoveal. Central Foveal Thickness: 308. Progression has been stable. Findings include abnormal foveal contour, retinal drusen , epiretinal membrane.   Notes Minor epiretinal membranes OU, stable over time will observe              ASSESSMENT/PLAN:  Moderate nonproliferative diabetic retinopathy of both eyes (HCC) Stable over time  Left epiretinal membrane Minor OS no impact on acuity  Lamellar macular hole, left eye Spontaneous closure accounts for little foveal atrophy and acuity  Macular pucker, right eye Minor no topographic distortion in the fovea  Intermediate stage nonexudative age-related macular degeneration of both eyes The nature of age--related macular degeneration was discussed with the patient as well as the distinction between dry and wet types. Checking an Amsler Grid daily with advice to return immediately should a distortion develop, was given to  the patient. The patient 's smoking status now and in the past was determined and advice based on the AREDS study was provided regarding the consumption of antioxidant supplements. AREDS 2 vitamin formulation was recommended. Consumption of dark leafy vegetables and fresh fruits of various colors was recommended. Treatment modalities for wet macular  degeneration particularly the use of intravitreal injections of anti-blood vessel growth factors was discussed with the patient. Avastin, Lucentis, and Eylea are the available options. On occasion, therapy includes the use of photodynamic therapy and thermal laser. Stressed to the patient do not rub eyes.  Patient was advised to check Amsler Grid daily and return immediately if changes are noted. Instructions on using the grid were given to the patient. All patient questions were answered.      ICD-10-CM   1. Posterior vitreous detachment of left eye  H43.812 OCT, Retina - OU - Both Eyes    2. Moderate nonproliferative diabetic retinopathy of both eyes associated with type 2 diabetes mellitus, macular edema presence unspecified ([place])  R74.0814     3. Left epiretinal membrane  H35.372     4. Lamellar macular hole, left eye  H35.342     5. Macular pucker, right eye  H35.371     6. Intermediate stage nonexudative age-related macular degeneration of both eyes  H35.3132       1.  OU with minor epiretinal membrane no impact on acuity  2.  OS with a history of spontaneous macular hole closure accounts for acuity with slight foveal atrophy  3.  Bilateral intermediate ARMD optional to use AREDS 2 vitamin supplementation  Ophthalmic Meds Ordered this visit:  No orders of the defined types were placed in this encounter.      Return in about 1 year (around 01/23/2023) for DILATE OU, COLOR FP, OCT.  Patient Instructions  The nature of age--related macular degeneration was discussed with the patient as well as the distinction between dry and wet types. Checking an Amsler Grid daily with advice to return immediately should a distortion develop, was given to the patient. The patient 's smoking status now and in the past was determined and advice based on the AREDS study was provided regarding the consumption of antioxidant supplements. AREDS 2 vitamin formulation was recommended. Consumption of dark  leafy vegetables and fresh fruits of various colors was recommended. Treatment modalities for wet macular degeneration particularly the use of intravitreal injections of anti-blood vessel growth factors was discussed with the patient. Avastin, Lucentis, and Eylea are the available options. On occasion, therapy includes the use of photodynamic therapy and thermal laser. Stressed to the patient do not rub eyes.  Patient was advised to check Amsler Grid daily and return immediately if changes are noted. Instructions on using the grid were given to the patient. All patient questions were answered.    Explained the diagnoses, plan, and follow up with the patient and they expressed understanding.  Patient expressed understanding of the importance of proper follow up care.   Clent Demark Everlee Quakenbush M.D. Diseases & Surgery of the Retina and Vitreous Retina [facility] Diabetic [place] 01/22/22     Abbreviations: M myopia (nearsighted); A astigmatism; H hyperopia (farsighted); P presbyopia; Mrx spectacle prescription;  CTL contact lenses; OD right eye; OS left eye; OU both eyes  XT exotropia; ET esotropia; PEK punctate epithelial keratitis; PEE punctate epithelial erosions; DES dry eye syndrome; MGD meibomian gland dysfunction; ATs artificial tears; PFAT's preservative free artificial tears; [place] nuclear sclerotic cataract; PSC posterior subcapsular  cataract; ERM epi-retinal membrane; PVD posterior vitreous detachment; RD retinal detachment; DM diabetes mellitus; DR diabetic retinopathy; NPDR non-proliferative diabetic retinopathy; PDR proliferative diabetic retinopathy; CSME clinically significant macular edema; DME diabetic macular edema; dbh dot blot hemorrhages; CWS cotton wool spot; POAG primary open angle glaucoma; C/D cup-to-disc ratio; HVF humphrey visual field; GVF goldmann visual field; OCT optical coherence tomography; IOP intraocular pressure; BRVO Branch retinal vein occlusion; CRVO central retinal vein occlusion;  CRAO central retinal artery occlusion; BRAO branch retinal artery occlusion; RT retinal tear; SB scleral buckle; PPV pars plana vitrectomy; VH Vitreous hemorrhage; PRP panretinal laser photocoagulation; IVK intravitreal kenalog; VMT vitreomacular traction; MH Macular hole;  NVD neovascularization of the disc; NVE neovascularization elsewhere; AREDS age related eye disease study; ARMD age related macular degeneration; POAG primary open angle glaucoma; EBMD epithelial/anterior basement membrane dystrophy; ACIOL anterior chamber intraocular lens; IOL intraocular lens; PCIOL posterior chamber intraocular lens; Phaco/IOL phacoemulsification with intraocular lens placement; [place] photorefractive keratectomy; LASIK laser assisted in situ keratomileusis; HTN hypertension; DM diabetes mellitus; COPD chronic obstructive pulmonary disease

## 2022-01-22 NOTE — Assessment & Plan Note (Signed)
Spontaneous closure accounts for little foveal atrophy and acuity

## 2022-01-22 NOTE — Assessment & Plan Note (Signed)
Minor OS no impact on acuity

## 2022-02-06 DIAGNOSIS — I10 Essential (primary) hypertension: Secondary | ICD-10-CM | POA: Diagnosis not present

## 2022-02-06 DIAGNOSIS — G629 Polyneuropathy, unspecified: Secondary | ICD-10-CM | POA: Diagnosis not present

## 2022-02-06 DIAGNOSIS — E1039 Type 1 diabetes mellitus with other diabetic ophthalmic complication: Secondary | ICD-10-CM | POA: Diagnosis not present

## 2022-02-06 DIAGNOSIS — E669 Obesity, unspecified: Secondary | ICD-10-CM | POA: Diagnosis not present

## 2022-02-06 DIAGNOSIS — Z79899 Other long term (current) drug therapy: Secondary | ICD-10-CM | POA: Diagnosis not present

## 2022-02-06 DIAGNOSIS — E039 Hypothyroidism, unspecified: Secondary | ICD-10-CM | POA: Diagnosis not present

## 2022-02-06 DIAGNOSIS — I2510 Atherosclerotic heart disease of native coronary artery without angina pectoris: Secondary | ICD-10-CM | POA: Diagnosis not present

## 2022-02-09 ENCOUNTER — Other Ambulatory Visit: Payer: Self-pay | Admitting: Internal Medicine

## 2022-03-11 DIAGNOSIS — I10 Essential (primary) hypertension: Secondary | ICD-10-CM | POA: Diagnosis not present

## 2022-03-11 DIAGNOSIS — E1039 Type 1 diabetes mellitus with other diabetic ophthalmic complication: Secondary | ICD-10-CM | POA: Diagnosis not present

## 2022-03-11 DIAGNOSIS — I2510 Atherosclerotic heart disease of native coronary artery without angina pectoris: Secondary | ICD-10-CM | POA: Diagnosis not present

## 2022-03-11 DIAGNOSIS — Z4681 Encounter for fitting and adjustment of insulin pump: Secondary | ICD-10-CM | POA: Diagnosis not present

## 2022-03-11 DIAGNOSIS — Z794 Long term (current) use of insulin: Secondary | ICD-10-CM | POA: Diagnosis not present

## 2022-05-09 DIAGNOSIS — E1039 Type 1 diabetes mellitus with other diabetic ophthalmic complication: Secondary | ICD-10-CM | POA: Diagnosis not present

## 2022-05-24 ENCOUNTER — Other Ambulatory Visit: Payer: Self-pay | Admitting: Internal Medicine

## 2022-06-10 DIAGNOSIS — Z23 Encounter for immunization: Secondary | ICD-10-CM | POA: Diagnosis not present

## 2022-06-10 DIAGNOSIS — E1049 Type 1 diabetes mellitus with other diabetic neurological complication: Secondary | ICD-10-CM | POA: Diagnosis not present

## 2022-06-10 DIAGNOSIS — E1039 Type 1 diabetes mellitus with other diabetic ophthalmic complication: Secondary | ICD-10-CM | POA: Diagnosis not present

## 2022-06-10 DIAGNOSIS — Z794 Long term (current) use of insulin: Secondary | ICD-10-CM | POA: Diagnosis not present

## 2022-06-10 DIAGNOSIS — I10 Essential (primary) hypertension: Secondary | ICD-10-CM | POA: Diagnosis not present

## 2022-06-10 DIAGNOSIS — J302 Other seasonal allergic rhinitis: Secondary | ICD-10-CM | POA: Diagnosis not present

## 2022-08-28 DIAGNOSIS — E1039 Type 1 diabetes mellitus with other diabetic ophthalmic complication: Secondary | ICD-10-CM | POA: Diagnosis not present

## 2022-09-01 DIAGNOSIS — E1039 Type 1 diabetes mellitus with other diabetic ophthalmic complication: Secondary | ICD-10-CM | POA: Diagnosis not present

## 2022-10-02 DIAGNOSIS — E1039 Type 1 diabetes mellitus with other diabetic ophthalmic complication: Secondary | ICD-10-CM | POA: Diagnosis not present

## 2022-10-05 ENCOUNTER — Other Ambulatory Visit: Payer: Self-pay | Admitting: Internal Medicine

## 2022-10-16 DIAGNOSIS — F329 Major depressive disorder, single episode, unspecified: Secondary | ICD-10-CM | POA: Diagnosis not present

## 2022-10-16 DIAGNOSIS — E1039 Type 1 diabetes mellitus with other diabetic ophthalmic complication: Secondary | ICD-10-CM | POA: Diagnosis not present

## 2022-10-16 DIAGNOSIS — Z794 Long term (current) use of insulin: Secondary | ICD-10-CM | POA: Diagnosis not present

## 2022-10-16 DIAGNOSIS — I10 Essential (primary) hypertension: Secondary | ICD-10-CM | POA: Diagnosis not present

## 2022-10-16 DIAGNOSIS — E669 Obesity, unspecified: Secondary | ICD-10-CM | POA: Diagnosis not present

## 2022-10-17 ENCOUNTER — Other Ambulatory Visit: Payer: Self-pay | Admitting: Cardiovascular Disease

## 2022-10-17 DIAGNOSIS — I10 Essential (primary) hypertension: Secondary | ICD-10-CM

## 2022-10-31 DIAGNOSIS — E1039 Type 1 diabetes mellitus with other diabetic ophthalmic complication: Secondary | ICD-10-CM | POA: Diagnosis not present

## 2022-11-08 ENCOUNTER — Other Ambulatory Visit: Payer: Self-pay | Admitting: Internal Medicine

## 2022-11-24 ENCOUNTER — Other Ambulatory Visit: Payer: Self-pay | Admitting: Internal Medicine

## 2022-12-01 DIAGNOSIS — E1039 Type 1 diabetes mellitus with other diabetic ophthalmic complication: Secondary | ICD-10-CM | POA: Diagnosis not present

## 2022-12-16 DIAGNOSIS — I10 Essential (primary) hypertension: Secondary | ICD-10-CM | POA: Diagnosis not present

## 2022-12-16 DIAGNOSIS — F331 Major depressive disorder, recurrent, moderate: Secondary | ICD-10-CM | POA: Diagnosis not present

## 2022-12-16 DIAGNOSIS — Z794 Long term (current) use of insulin: Secondary | ICD-10-CM | POA: Diagnosis not present

## 2022-12-16 DIAGNOSIS — E1039 Type 1 diabetes mellitus with other diabetic ophthalmic complication: Secondary | ICD-10-CM | POA: Diagnosis not present

## 2022-12-19 ENCOUNTER — Other Ambulatory Visit: Payer: Self-pay | Admitting: Internal Medicine

## 2022-12-19 DIAGNOSIS — Z1231 Encounter for screening mammogram for malignant neoplasm of breast: Secondary | ICD-10-CM | POA: Diagnosis not present

## 2022-12-26 ENCOUNTER — Other Ambulatory Visit: Payer: Self-pay | Admitting: Internal Medicine

## 2022-12-31 DIAGNOSIS — E1039 Type 1 diabetes mellitus with other diabetic ophthalmic complication: Secondary | ICD-10-CM | POA: Diagnosis not present

## 2023-01-13 NOTE — Progress Notes (Unsigned)
Cardiology Clinic Note   Date: 01/15/2023 ID: Kelsey Pierce, DOB 07-Nov-1945, MRN [id number]  Primary Cardiologist:  Chrystie Nose, MD  Patient Profile    Kelsey Pierce is a 77 y.o. female who presents to the clinic today for overdue follow up.   Past medical history significant for: CAD. LHC 05/04/2018 (NSTEMI): High-grade culprit of obstruction in the mid RCA 95%.  Mid LAD 50 to 70%.  Ostial to proximal D1 70%.  PCI with angioplasty and DES 3.0 x 28 to mid RCA. Cardiac murmur. Echo 05/03/2018: EF 55 to 60%.  Mild hypokinesis.  Mild LVH.  Unable to evaluate diastolic function.  Mild MR.  Mild TR.  Mild pulmonary hypertension.  Mildly thickened leaflets of aortic valve without stenosis. Palpitations. Hypertension. Hyperlipidemia. Lipid panel 08/31/2021: LDL 88, HDL 62, TG 129, total 173. IDDM.   History of Present Illness    Kelsey Pierce is followed by Dr. Rennis Golden for the above outlined history.  Patient was last evaluated via video visit by Azalee Course, PA-C on 11/09/2019 for routine follow-up.  She was doing well and no changes were made.  Today, patient is accompanied by her daughter. She denies shortness of breath.  She reports mild dyspnea with heavy exertion such as walking about 1 flight of stairs but no dyspnea with routine activities.  No chest pain, pressure, or tightness. Denies orthopnea or PND. No palpitations.  She reports occasional lower extremity edema if she sits for a long period of time.  She tries to prop her legs up occasionally throughout the day.  Legs are typically normal upon awakening.  Patient stays active doing yard work and other light chores around the house.  She and her daughter try to walk outside for exercise but have been inconsistent.     ROS: All other systems reviewed and are otherwise negative except as noted in History of Present Illness.  Studies Reviewed    ECG personally reviewed by me today: NSR, 70 bpm.  No significant changes from  05/20/2019.         Physical Exam    VS:  BP 136/82   Pulse 70   Ht 5\' 3"  (1.6 m)   Wt 184 lb 12.8 oz (83.8 kg)   LMP 08/06/1999 (Approximate)   SpO2 97%   BMI 32.74 kg/m  , BMI Body mass index is 32.74 kg/m.  GEN: Well nourished, well developed, in no acute distress. Neck: No JVD or carotid bruits. Cardiac:  RRR.  2/6 systolic murmur. No rubs or gallops.   Respiratory:  Respirations regular and unlabored. Clear to auscultation without rales, wheezing or rhonchi. GI: Soft, nontender, nondistended. Extremities: Radials/DP/PT 2+ and equal bilaterally. No clubbing or cyanosis. No edema.  Skin: Warm and dry, no rash. Neuro: Strength intact.  Assessment & Plan    CAD.  S/p PCI with DES to mid RCA September 2019.  Patient has chest pain, tightness, pressure.  Mild dyspnea with heavy exertion but not with routine activities.  Patient stays active doing yard work and other light household chores.  She and her daughter try to walk for exercise but are typically inconsistent.  Continue aspirin, metoprolol, rosuvastatin, Zetia, as needed SL NTG. Cardiac murmur.  Echo September 2019 showed normal LV function, mild LVH, mild MR/TR.  Patient denies lower extremity edema, shortness of breath at rest, DOE with routine activities, activity intolerance.  2/6 systolic murmur auscultated today on exam. Hypertension. BP today 136/82 on intake, 130/72 on my recheck.  Patient denies headaches, dizziness or vision changes. Continue amlodipine, metoprolol. Hyperlipidemia.  LDL January 2023 88, not at goal.  Discussed dietary changes and increased activity.  Continue rosuvastatin and Zetia.  Labs drawn by PCP.  Patient "fell through the cracks" for her annual visit in January.  She is scheduled in the upcoming months and will have labs drawn at that time.  Disposition: Return in 1 year or sooner as needed.         Signed, Kelsey Grandchild. Toriann Spadoni, DNP, NP-C SR, 70 bpm.

## 2023-01-15 ENCOUNTER — Ambulatory Visit: Payer: Medicare PPO | Attending: Student | Admitting: Student

## 2023-01-15 ENCOUNTER — Encounter: Payer: Self-pay | Admitting: Student

## 2023-01-15 VITALS — BP 136/82 | HR 70 | Ht 63.0 in | Wt 184.8 lb

## 2023-01-15 DIAGNOSIS — I361 Nonrheumatic tricuspid (valve) insufficiency: Secondary | ICD-10-CM | POA: Diagnosis not present

## 2023-01-15 DIAGNOSIS — I340 Nonrheumatic mitral (valve) insufficiency: Secondary | ICD-10-CM

## 2023-01-15 DIAGNOSIS — E785 Hyperlipidemia, unspecified: Secondary | ICD-10-CM | POA: Diagnosis not present

## 2023-01-15 DIAGNOSIS — I2510 Atherosclerotic heart disease of native coronary artery without angina pectoris: Secondary | ICD-10-CM | POA: Diagnosis not present

## 2023-01-15 DIAGNOSIS — R011 Cardiac murmur, unspecified: Secondary | ICD-10-CM | POA: Diagnosis not present

## 2023-01-15 DIAGNOSIS — I10 Essential (primary) hypertension: Secondary | ICD-10-CM | POA: Diagnosis not present

## 2023-01-15 NOTE — Patient Instructions (Signed)
Medication Instructions:  Your physician recommends that you continue on your current medications as directed. Please refer to the Current Medication list given to you today.  *If you need a refill on your cardiac medications before your next appointment, please call your pharmacy*   Lab Work: NONE If you have labs (blood work) drawn today and your tests are completely normal, you will receive your results only by: MyChart Message (if you have MyChart) OR A paper copy in the mail If you have any lab test that is abnormal or we need to change your treatment, we will call you to review the results.   Testing/Procedures: NONE   Follow-Up: At [facility], you and your health needs are our priority.  As part of our continuing mission to provide you with exceptional heart care, we have created designated Provider Care Teams.  These Care Teams include your primary Cardiologist (physician) and Advanced Practice Providers (APPs -  Physician Assistants and Nurse Practitioners) who all work together to provide you with the care you need, when you need it.  We recommend signing up for the patient portal called "MyChart".  Sign up information is provided on this After Visit Summary.  MyChart is used to connect with patients for Virtual Visits (Telemedicine).  Patients are able to view lab/test results, encounter notes, upcoming appointments, etc.  Non-urgent messages can be sent to your provider as well.   To learn more about what you can do with MyChart, go to [email or web address].    Your next appointment:   1 year(s)  Provider:   Kenneth C Hilty, MD    

## 2023-01-21 ENCOUNTER — Other Ambulatory Visit: Payer: Self-pay | Admitting: Internal Medicine

## 2023-01-24 DIAGNOSIS — E1039 Type 1 diabetes mellitus with other diabetic ophthalmic complication: Secondary | ICD-10-CM | POA: Diagnosis not present

## 2023-01-27 ENCOUNTER — Encounter (INDEPENDENT_AMBULATORY_CARE_PROVIDER_SITE_OTHER): Payer: Medicare PPO | Admitting: Ophthalmology

## 2023-01-31 DIAGNOSIS — E1039 Type 1 diabetes mellitus with other diabetic ophthalmic complication: Secondary | ICD-10-CM | POA: Diagnosis not present

## 2023-02-03 DIAGNOSIS — H401131 Primary open-angle glaucoma, bilateral, mild stage: Secondary | ICD-10-CM | POA: Diagnosis not present

## 2023-02-03 DIAGNOSIS — H43812 Vitreous degeneration, left eye: Secondary | ICD-10-CM | POA: Diagnosis not present

## 2023-02-03 DIAGNOSIS — H353132 Nonexudative age-related macular degeneration, bilateral, intermediate dry stage: Secondary | ICD-10-CM | POA: Diagnosis not present

## 2023-02-03 DIAGNOSIS — H43312 Vitreous membranes and strands, left eye: Secondary | ICD-10-CM | POA: Diagnosis not present

## 2023-02-03 DIAGNOSIS — H35371 Puckering of macula, right eye: Secondary | ICD-10-CM | POA: Diagnosis not present

## 2023-02-15 ENCOUNTER — Other Ambulatory Visit: Payer: Self-pay | Admitting: Internal Medicine

## 2023-03-02 DIAGNOSIS — E1039 Type 1 diabetes mellitus with other diabetic ophthalmic complication: Secondary | ICD-10-CM | POA: Diagnosis not present

## 2023-04-23 DIAGNOSIS — Z79899 Other long term (current) drug therapy: Secondary | ICD-10-CM | POA: Diagnosis not present

## 2023-04-23 DIAGNOSIS — E103293 Type 1 diabetes mellitus with mild nonproliferative diabetic retinopathy without macular edema, bilateral: Secondary | ICD-10-CM | POA: Diagnosis not present

## 2023-04-23 DIAGNOSIS — I2510 Atherosclerotic heart disease of native coronary artery without angina pectoris: Secondary | ICD-10-CM | POA: Diagnosis not present

## 2023-04-23 DIAGNOSIS — I10 Essential (primary) hypertension: Secondary | ICD-10-CM | POA: Diagnosis not present

## 2023-04-23 DIAGNOSIS — E039 Hypothyroidism, unspecified: Secondary | ICD-10-CM | POA: Diagnosis not present

## 2023-05-07 DIAGNOSIS — Z23 Encounter for immunization: Secondary | ICD-10-CM | POA: Diagnosis not present

## 2023-05-07 DIAGNOSIS — R82998 Other abnormal findings in urine: Secondary | ICD-10-CM | POA: Diagnosis not present

## 2023-05-07 DIAGNOSIS — Z0000 Encounter for general adult medical examination without abnormal findings: Secondary | ICD-10-CM | POA: Diagnosis not present

## 2023-05-07 DIAGNOSIS — E785 Hyperlipidemia, unspecified: Secondary | ICD-10-CM | POA: Diagnosis not present

## 2023-05-07 DIAGNOSIS — G629 Polyneuropathy, unspecified: Secondary | ICD-10-CM | POA: Diagnosis not present

## 2023-05-07 DIAGNOSIS — I2510 Atherosclerotic heart disease of native coronary artery without angina pectoris: Secondary | ICD-10-CM | POA: Diagnosis not present

## 2023-05-07 DIAGNOSIS — Z794 Long term (current) use of insulin: Secondary | ICD-10-CM | POA: Diagnosis not present

## 2023-05-07 DIAGNOSIS — R011 Cardiac murmur, unspecified: Secondary | ICD-10-CM | POA: Diagnosis not present

## 2023-05-07 DIAGNOSIS — Z1331 Encounter for screening for depression: Secondary | ICD-10-CM | POA: Diagnosis not present

## 2023-05-07 DIAGNOSIS — I10 Essential (primary) hypertension: Secondary | ICD-10-CM | POA: Diagnosis not present

## 2023-05-07 DIAGNOSIS — E1039 Type 1 diabetes mellitus with other diabetic ophthalmic complication: Secondary | ICD-10-CM | POA: Diagnosis not present

## 2023-05-07 DIAGNOSIS — E039 Hypothyroidism, unspecified: Secondary | ICD-10-CM | POA: Diagnosis not present

## 2023-05-07 DIAGNOSIS — Z1339 Encounter for screening examination for other mental health and behavioral disorders: Secondary | ICD-10-CM | POA: Diagnosis not present

## 2023-07-07 DIAGNOSIS — H353132 Nonexudative age-related macular degeneration, bilateral, intermediate dry stage: Secondary | ICD-10-CM | POA: Diagnosis not present

## 2023-07-07 DIAGNOSIS — H43312 Vitreous membranes and strands, left eye: Secondary | ICD-10-CM | POA: Diagnosis not present

## 2023-07-07 DIAGNOSIS — H401131 Primary open-angle glaucoma, bilateral, mild stage: Secondary | ICD-10-CM | POA: Diagnosis not present

## 2023-07-07 DIAGNOSIS — H43812 Vitreous degeneration, left eye: Secondary | ICD-10-CM | POA: Diagnosis not present

## 2023-07-07 DIAGNOSIS — H35371 Puckering of macula, right eye: Secondary | ICD-10-CM | POA: Diagnosis not present

## 2023-09-08 DIAGNOSIS — I10 Essential (primary) hypertension: Secondary | ICD-10-CM | POA: Diagnosis not present

## 2023-09-08 DIAGNOSIS — E039 Hypothyroidism, unspecified: Secondary | ICD-10-CM | POA: Diagnosis not present

## 2023-09-08 DIAGNOSIS — I25118 Atherosclerotic heart disease of native coronary artery with other forms of angina pectoris: Secondary | ICD-10-CM | POA: Diagnosis not present

## 2023-09-08 DIAGNOSIS — I2510 Atherosclerotic heart disease of native coronary artery without angina pectoris: Secondary | ICD-10-CM | POA: Diagnosis not present

## 2023-09-08 DIAGNOSIS — Z794 Long term (current) use of insulin: Secondary | ICD-10-CM | POA: Diagnosis not present

## 2023-09-08 DIAGNOSIS — E782 Mixed hyperlipidemia: Secondary | ICD-10-CM | POA: Diagnosis not present

## 2023-09-08 DIAGNOSIS — R011 Cardiac murmur, unspecified: Secondary | ICD-10-CM | POA: Diagnosis not present

## 2023-09-08 DIAGNOSIS — E1049 Type 1 diabetes mellitus with other diabetic neurological complication: Secondary | ICD-10-CM | POA: Diagnosis not present

## 2023-09-08 DIAGNOSIS — E103293 Type 1 diabetes mellitus with mild nonproliferative diabetic retinopathy without macular edema, bilateral: Secondary | ICD-10-CM | POA: Diagnosis not present

## 2023-09-16 DIAGNOSIS — E1039 Type 1 diabetes mellitus with other diabetic ophthalmic complication: Secondary | ICD-10-CM | POA: Diagnosis not present

## 2023-12-15 ENCOUNTER — Other Ambulatory Visit: Payer: Self-pay | Admitting: Internal Medicine

## 2024-01-05 DIAGNOSIS — I10 Essential (primary) hypertension: Secondary | ICD-10-CM | POA: Diagnosis not present

## 2024-01-05 DIAGNOSIS — E1039 Type 1 diabetes mellitus with other diabetic ophthalmic complication: Secondary | ICD-10-CM | POA: Diagnosis not present

## 2024-01-05 DIAGNOSIS — M1990 Unspecified osteoarthritis, unspecified site: Secondary | ICD-10-CM | POA: Diagnosis not present

## 2024-01-05 DIAGNOSIS — E039 Hypothyroidism, unspecified: Secondary | ICD-10-CM | POA: Diagnosis not present

## 2024-01-05 DIAGNOSIS — M40294 Other kyphosis, thoracic region: Secondary | ICD-10-CM | POA: Diagnosis not present

## 2024-01-05 DIAGNOSIS — I25118 Atherosclerotic heart disease of native coronary artery with other forms of angina pectoris: Secondary | ICD-10-CM | POA: Diagnosis not present

## 2024-01-05 DIAGNOSIS — E782 Mixed hyperlipidemia: Secondary | ICD-10-CM | POA: Diagnosis not present

## 2024-01-05 DIAGNOSIS — G629 Polyneuropathy, unspecified: Secondary | ICD-10-CM | POA: Diagnosis not present

## 2024-01-05 DIAGNOSIS — Z794 Long term (current) use of insulin: Secondary | ICD-10-CM | POA: Diagnosis not present

## 2024-01-08 DIAGNOSIS — E1039 Type 1 diabetes mellitus with other diabetic ophthalmic complication: Secondary | ICD-10-CM | POA: Diagnosis not present

## 2024-02-09 ENCOUNTER — Other Ambulatory Visit: Payer: Self-pay | Admitting: Internal Medicine

## 2024-02-19 ENCOUNTER — Other Ambulatory Visit: Payer: Self-pay | Admitting: Internal Medicine

## 2024-03-01 DIAGNOSIS — H353132 Nonexudative age-related macular degeneration, bilateral, intermediate dry stage: Secondary | ICD-10-CM | POA: Diagnosis not present

## 2024-03-01 DIAGNOSIS — H43812 Vitreous degeneration, left eye: Secondary | ICD-10-CM | POA: Diagnosis not present

## 2024-03-01 DIAGNOSIS — H401131 Primary open-angle glaucoma, bilateral, mild stage: Secondary | ICD-10-CM | POA: Diagnosis not present

## 2024-03-01 DIAGNOSIS — H35373 Puckering of macula, bilateral: Secondary | ICD-10-CM | POA: Diagnosis not present

## 2024-03-01 DIAGNOSIS — H43312 Vitreous membranes and strands, left eye: Secondary | ICD-10-CM | POA: Diagnosis not present

## 2024-04-23 DIAGNOSIS — E1039 Type 1 diabetes mellitus with other diabetic ophthalmic complication: Secondary | ICD-10-CM | POA: Diagnosis not present

## 2024-05-12 DIAGNOSIS — E782 Mixed hyperlipidemia: Secondary | ICD-10-CM | POA: Diagnosis not present

## 2024-05-12 DIAGNOSIS — E1049 Type 1 diabetes mellitus with other diabetic neurological complication: Secondary | ICD-10-CM | POA: Diagnosis not present

## 2024-05-12 DIAGNOSIS — I10 Essential (primary) hypertension: Secondary | ICD-10-CM | POA: Diagnosis not present

## 2024-05-12 DIAGNOSIS — E039 Hypothyroidism, unspecified: Secondary | ICD-10-CM | POA: Diagnosis not present

## 2024-05-19 DIAGNOSIS — Z23 Encounter for immunization: Secondary | ICD-10-CM | POA: Diagnosis not present

## 2024-05-19 DIAGNOSIS — Z0000 Encounter for general adult medical examination without abnormal findings: Secondary | ICD-10-CM | POA: Diagnosis not present

## 2024-05-19 DIAGNOSIS — E1049 Type 1 diabetes mellitus with other diabetic neurological complication: Secondary | ICD-10-CM | POA: Diagnosis not present

## 2024-05-19 DIAGNOSIS — R82998 Other abnormal findings in urine: Secondary | ICD-10-CM | POA: Diagnosis not present

## 2024-05-19 DIAGNOSIS — E039 Hypothyroidism, unspecified: Secondary | ICD-10-CM | POA: Diagnosis not present

## 2024-05-19 DIAGNOSIS — Z1339 Encounter for screening examination for other mental health and behavioral disorders: Secondary | ICD-10-CM | POA: Diagnosis not present

## 2024-05-19 DIAGNOSIS — E785 Hyperlipidemia, unspecified: Secondary | ICD-10-CM | POA: Diagnosis not present

## 2024-05-19 DIAGNOSIS — F331 Major depressive disorder, recurrent, moderate: Secondary | ICD-10-CM | POA: Diagnosis not present

## 2024-05-19 DIAGNOSIS — Z1331 Encounter for screening for depression: Secondary | ICD-10-CM | POA: Diagnosis not present

## 2024-09-02 ENCOUNTER — Other Ambulatory Visit: Payer: Self-pay | Admitting: Internal Medicine
